# Patient Record
Sex: Male | Born: 1935
Health system: Southern US, Community
[De-identification: ages and names within clinical notes are randomized; demographics above are authoritative.]

## PROBLEM LIST (undated history)

## (undated) DIAGNOSIS — E785 Hyperlipidemia, unspecified: Secondary | ICD-10-CM

## (undated) DIAGNOSIS — L719 Rosacea, unspecified: Secondary | ICD-10-CM

## (undated) DIAGNOSIS — I10 Essential (primary) hypertension: Secondary | ICD-10-CM

## (undated) DIAGNOSIS — K219 Gastro-esophageal reflux disease without esophagitis: Secondary | ICD-10-CM

## (undated) DIAGNOSIS — I499 Cardiac arrhythmia, unspecified: Secondary | ICD-10-CM

## (undated) DIAGNOSIS — F419 Anxiety disorder, unspecified: Secondary | ICD-10-CM

## (undated) DIAGNOSIS — K279 Peptic ulcer, site unspecified, unspecified as acute or chronic, without hemorrhage or perforation: Secondary | ICD-10-CM

## (undated) DIAGNOSIS — N281 Cyst of kidney, acquired: Secondary | ICD-10-CM

## (undated) HISTORY — PX: KNEE ARTHROSCOPY: SUR90

## (undated) HISTORY — DX: Hyperlipidemia, unspecified: E78.5

## (undated) HISTORY — DX: Essential (primary) hypertension: I10

## (undated) HISTORY — DX: Cyst of kidney, acquired: N28.1

## (undated) HISTORY — DX: Anxiety disorder, unspecified: F41.9

## (undated) HISTORY — DX: Gastro-esophageal reflux disease without esophagitis: K21.9

## (undated) HISTORY — PX: CHOLECYSTECTOMY: SHX55

## (undated) HISTORY — DX: Cardiac arrhythmia, unspecified: I49.9

## (undated) HISTORY — DX: Peptic ulcer, site unspecified, unspecified as acute or chronic, without hemorrhage or perforation: K27.9

## (undated) HISTORY — DX: Rosacea, unspecified: L71.9

---

## 1972-10-28 HISTORY — PX: PARTIAL GASTRECTOMY: SHX2172

## 1988-10-28 DIAGNOSIS — N281 Cyst of kidney, acquired: Secondary | ICD-10-CM

## 1999-07-09 ENCOUNTER — Encounter: Payer: Self-pay | Admitting: Urology

## 1999-07-09 ENCOUNTER — Ambulatory Visit (HOSPITAL_COMMUNITY): Admission: RE | Admit: 1999-07-09 | Discharge: 1999-07-09 | Payer: Self-pay | Admitting: Urology

## 2000-04-04 ENCOUNTER — Ambulatory Visit (HOSPITAL_COMMUNITY): Admission: RE | Admit: 2000-04-04 | Discharge: 2000-04-04 | Payer: Self-pay | Admitting: Cardiology

## 2002-01-25 ENCOUNTER — Encounter: Payer: Self-pay | Admitting: Emergency Medicine

## 2002-01-25 ENCOUNTER — Emergency Department (HOSPITAL_COMMUNITY): Admission: EM | Admit: 2002-01-25 | Discharge: 2002-01-25 | Payer: Self-pay | Admitting: Emergency Medicine

## 2002-10-28 LAB — HM COLONOSCOPY

## 2004-09-13 ENCOUNTER — Ambulatory Visit: Payer: Self-pay | Admitting: Internal Medicine

## 2004-11-13 ENCOUNTER — Ambulatory Visit: Payer: Self-pay | Admitting: Internal Medicine

## 2005-02-19 ENCOUNTER — Ambulatory Visit: Payer: Self-pay | Admitting: Internal Medicine

## 2005-06-18 ENCOUNTER — Ambulatory Visit: Payer: Self-pay | Admitting: Internal Medicine

## 2005-09-11 ENCOUNTER — Ambulatory Visit: Payer: Self-pay | Admitting: Internal Medicine

## 2005-09-16 ENCOUNTER — Ambulatory Visit: Payer: Self-pay | Admitting: Internal Medicine

## 2005-12-17 ENCOUNTER — Ambulatory Visit: Payer: Self-pay | Admitting: Internal Medicine

## 2006-07-31 ENCOUNTER — Ambulatory Visit: Payer: Self-pay | Admitting: Internal Medicine

## 2006-12-02 ENCOUNTER — Ambulatory Visit: Payer: Self-pay | Admitting: Internal Medicine

## 2006-12-05 ENCOUNTER — Encounter: Admission: RE | Admit: 2006-12-05 | Discharge: 2006-12-05 | Payer: Self-pay | Admitting: Internal Medicine

## 2006-12-05 ENCOUNTER — Encounter: Payer: Self-pay | Admitting: Internal Medicine

## 2006-12-09 ENCOUNTER — Ambulatory Visit: Payer: Self-pay | Admitting: Internal Medicine

## 2006-12-09 LAB — CONVERTED CEMR LAB: Hgb A1c MFr Bld: 8 % — ABNORMAL HIGH (ref 4.6–6.0)

## 2007-05-27 DIAGNOSIS — I10 Essential (primary) hypertension: Secondary | ICD-10-CM | POA: Insufficient documentation

## 2007-05-27 DIAGNOSIS — L719 Rosacea, unspecified: Secondary | ICD-10-CM

## 2007-05-27 DIAGNOSIS — K279 Peptic ulcer, site unspecified, unspecified as acute or chronic, without hemorrhage or perforation: Secondary | ICD-10-CM | POA: Insufficient documentation

## 2007-05-27 DIAGNOSIS — F39 Unspecified mood [affective] disorder: Secondary | ICD-10-CM

## 2007-05-27 DIAGNOSIS — K219 Gastro-esophageal reflux disease without esophagitis: Secondary | ICD-10-CM

## 2007-06-09 DIAGNOSIS — E1142 Type 2 diabetes mellitus with diabetic polyneuropathy: Secondary | ICD-10-CM | POA: Insufficient documentation

## 2007-06-09 DIAGNOSIS — E1149 Type 2 diabetes mellitus with other diabetic neurological complication: Secondary | ICD-10-CM

## 2007-06-10 ENCOUNTER — Ambulatory Visit: Payer: Self-pay | Admitting: Internal Medicine

## 2007-06-11 LAB — CONVERTED CEMR LAB
Albumin: 3.7 g/dL (ref 3.5–5.2)
GFR calc Af Amer: 108 mL/min
GFR calc non Af Amer: 89 mL/min
Glucose, Bld: 168 mg/dL — ABNORMAL HIGH (ref 70–99)
Microalb Creat Ratio: 4.1 mg/g (ref 0.0–30.0)
Phosphorus: 2.1 mg/dL — ABNORMAL LOW (ref 2.3–4.6)
Potassium: 4.2 meq/L (ref 3.5–5.1)
Sodium: 139 meq/L (ref 135–145)

## 2007-07-03 ENCOUNTER — Encounter (INDEPENDENT_AMBULATORY_CARE_PROVIDER_SITE_OTHER): Payer: Self-pay | Admitting: *Deleted

## 2007-08-14 ENCOUNTER — Ambulatory Visit: Payer: Self-pay | Admitting: Internal Medicine

## 2007-12-08 ENCOUNTER — Ambulatory Visit: Payer: Self-pay | Admitting: Internal Medicine

## 2007-12-09 LAB — CONVERTED CEMR LAB
ALT: 31 units/L (ref 0–53)
Albumin: 3.9 g/dL (ref 3.5–5.2)
BUN: 21 mg/dL (ref 6–23)
Basophils Relative: 0.1 % (ref 0.0–1.0)
Calcium: 9.1 mg/dL (ref 8.4–10.5)
Creatinine, Ser: 1 mg/dL (ref 0.4–1.5)
GFR calc non Af Amer: 79 mL/min
HDL: 32.1 mg/dL — ABNORMAL LOW (ref >39.0)
Hgb A1c MFr Bld: 7.6 % — ABNORMAL HIGH (ref 4.6–6.0)
LDL Cholesterol: 110 mg/dL — ABNORMAL HIGH (ref 0–99)
Lymphocytes Relative: 40.2 % (ref 12.0–46.0)
Monocytes Relative: 8 % (ref 3.0–11.0)
Neutro Abs: 3.1 10*3/uL (ref 1.4–7.7)
Phosphorus: 2.7 mg/dL (ref 2.3–4.6)
Platelets: 219 10*3/uL (ref 150–400)
Potassium: 4.7 meq/L (ref 3.5–5.1)
RBC: 4.7 M/uL (ref 4.22–5.81)
TSH: 1.95 microintl units/mL (ref 0.35–5.50)
Total CHOL/HDL Ratio: 5.5
Triglycerides: 173 mg/dL — ABNORMAL HIGH (ref 0–149)
VLDL: 35 mg/dL (ref 0–40)
WBC: 6.2 10*3/uL (ref 4.5–10.5)

## 2008-06-17 ENCOUNTER — Ambulatory Visit: Payer: Self-pay | Admitting: Internal Medicine

## 2008-06-20 LAB — CONVERTED CEMR LAB
Calcium: 9.6 mg/dL (ref 8.4–10.5)
Eosinophils Absolute: 0.1 10*3/uL (ref 0.0–0.7)
GFR calc Af Amer: 85 mL/min
GFR calc non Af Amer: 71 mL/min
HCT: 48.1 % (ref 39.0–52.0)
Hgb A1c MFr Bld: 7.6 % — ABNORMAL HIGH (ref 4.6–6.0)
MCV: 95.7 fL (ref 78.0–100.0)
Monocytes Absolute: 0.4 10*3/uL (ref 0.1–1.0)
Phosphorus: 3.1 mg/dL (ref 2.3–4.6)
Platelets: 221 10*3/uL (ref 150–400)
Potassium: 4.7 meq/L (ref 3.5–5.1)
RDW: 11.6 % (ref 11.5–14.6)
Sodium: 141 meq/L (ref 135–145)

## 2008-07-27 ENCOUNTER — Ambulatory Visit: Payer: Self-pay | Admitting: Internal Medicine

## 2008-09-10 IMAGING — US US RENAL
1 series · 14 of 25 positions shown · non-contrast
Comparison: none

CLINICAL DATA: Evaluate left renal cyst.
 RENAL/URINARY TRACT ULTRASOUND:
TECHNIQUE: Complete ultrasound of the urinary tract was performed including evaluation of the kidney, renal collecting systems, and urinary bladder.
 No hydronephrosis is seen. The right kidney measures 12.4 cm sagittally with the left kidney measuring 12.7 cm.  The echogenicity of the renal parenchyma is within normal limits.  There is a large left upper pole renal cyst medially measuring 8.9 x 5.7 x 9.2 cm.  No septations are noted.  The urinary bladder is not well distended.  Prostate is within upper limits of normal.

[Series 1: unknown · 0.26mm/px · 14 of 41 slices shown]
[im 1/41]
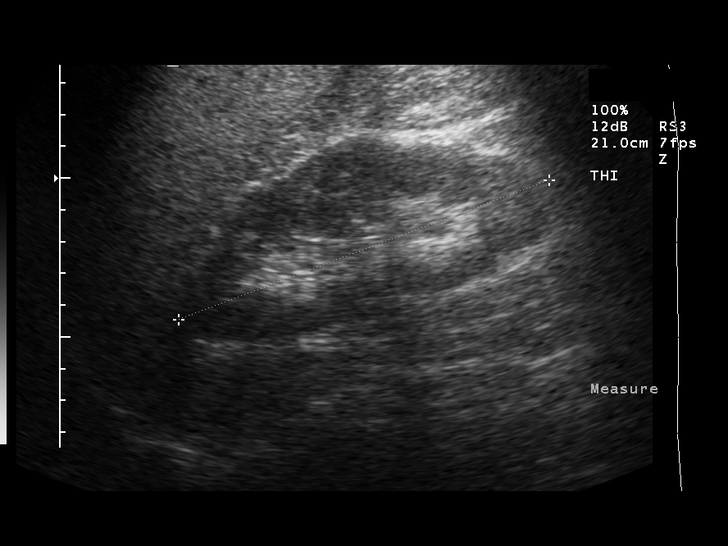
[im 4/41]
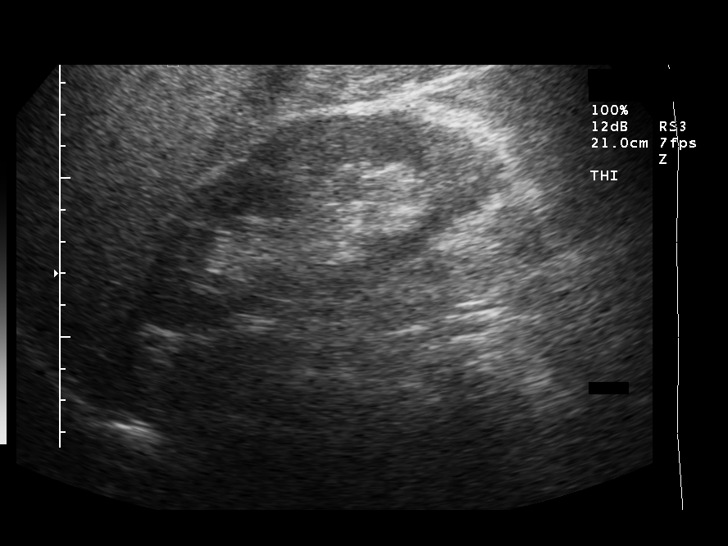
[im 7/41]
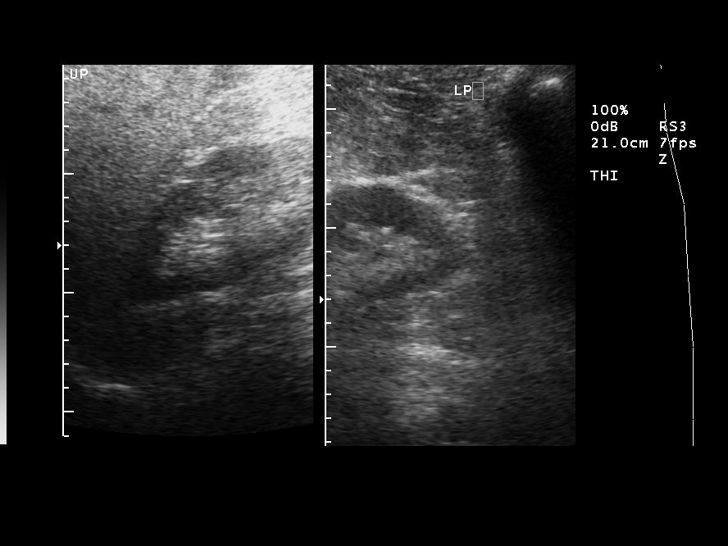
[im 11/41]
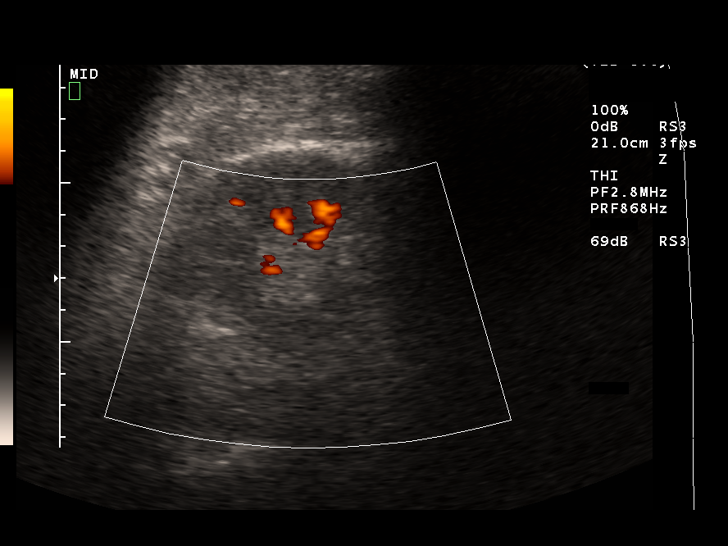
[im 14/41]
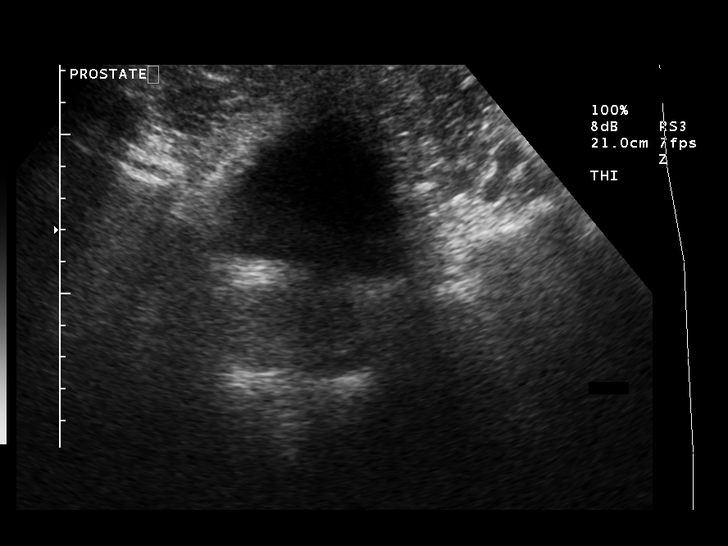
[im 16/41]
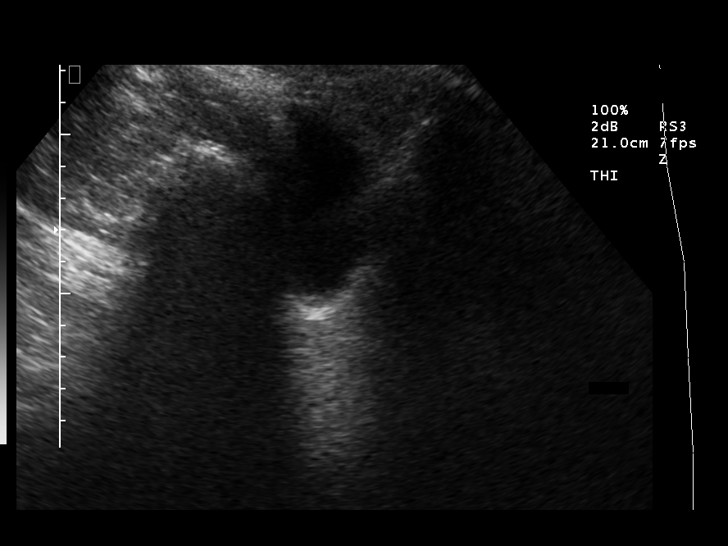
[im 19/41]
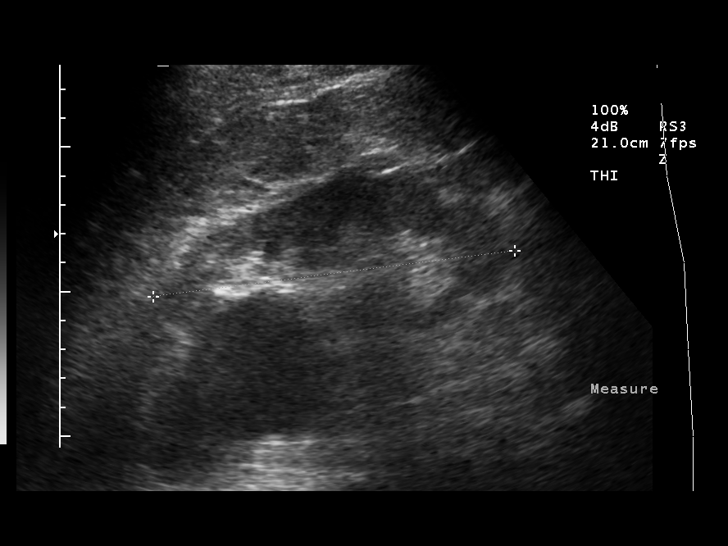
[im 22/41]
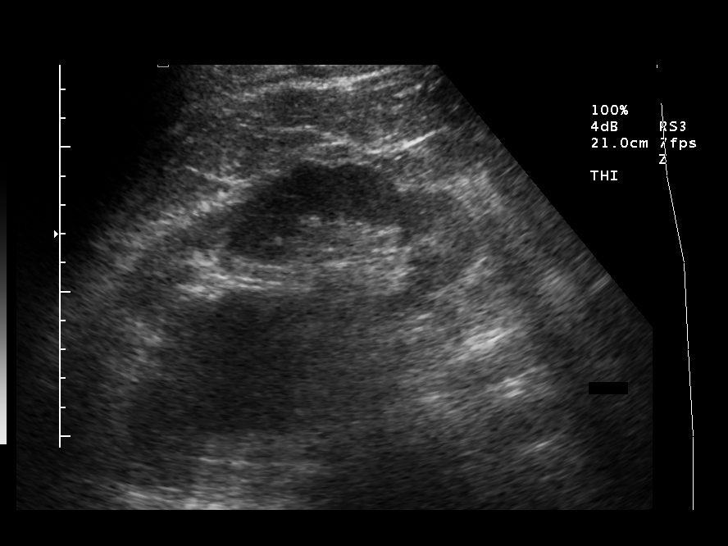
[im 26/41]
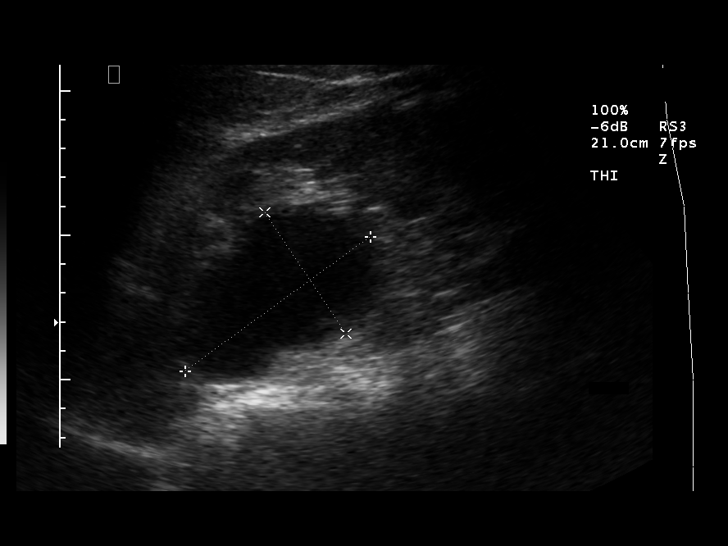
[im 27/41]
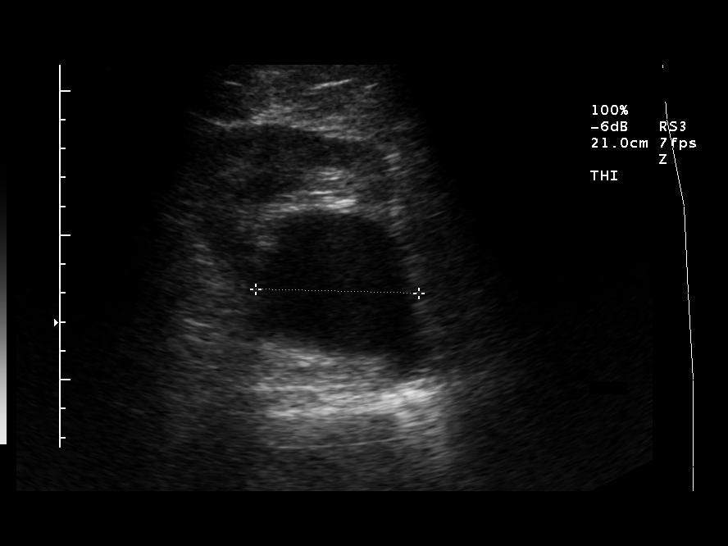
[im 31/41]
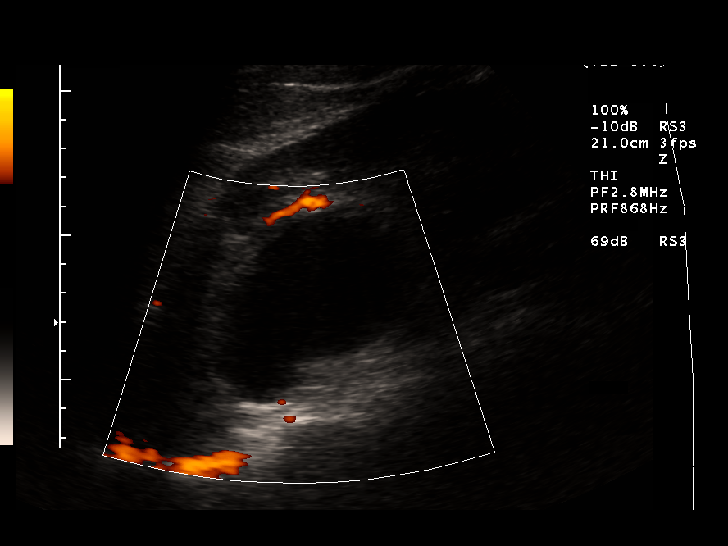
[im 34/41]
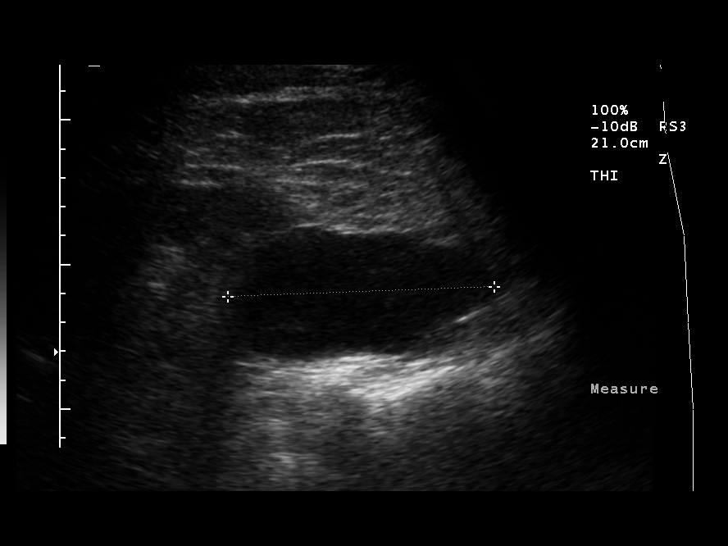
[im 37/41]
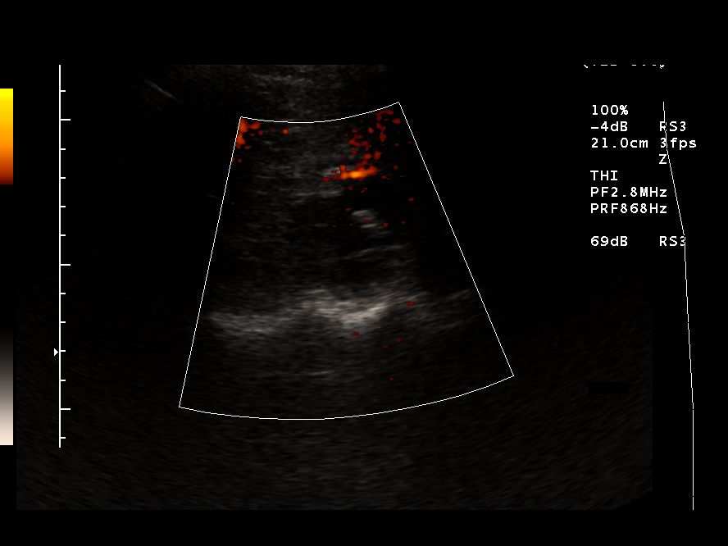
[im 41/41]
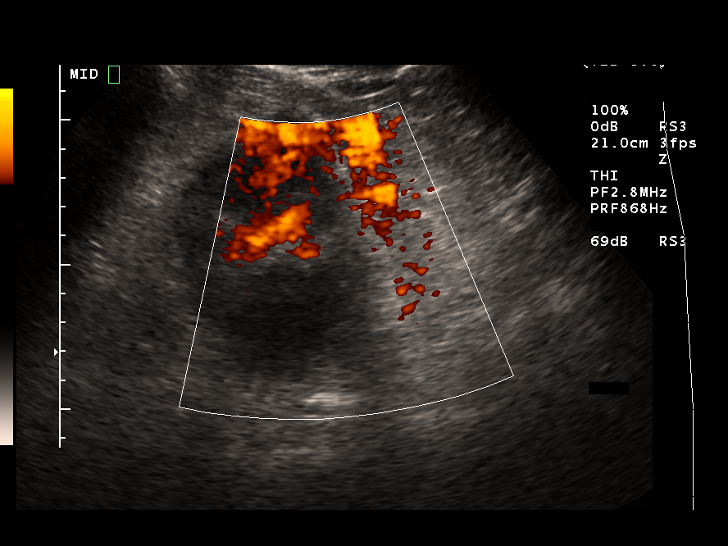

[14 of 25 positions shown; findings below may reference images not displayed]

IMPRESSION: No hydronephrosis.  8.9 cm medial left upper pole renal cyst.  No complicating features.

## 2008-12-20 ENCOUNTER — Ambulatory Visit: Payer: Self-pay | Admitting: Internal Medicine

## 2008-12-20 DIAGNOSIS — E785 Hyperlipidemia, unspecified: Secondary | ICD-10-CM

## 2008-12-20 DIAGNOSIS — R079 Chest pain, unspecified: Secondary | ICD-10-CM

## 2008-12-20 DIAGNOSIS — E1169 Type 2 diabetes mellitus with other specified complication: Secondary | ICD-10-CM | POA: Insufficient documentation

## 2009-06-13 ENCOUNTER — Ambulatory Visit: Payer: Self-pay | Admitting: Internal Medicine

## 2009-06-15 LAB — CONVERTED CEMR LAB
Basophils Relative: 0.1 % (ref 0.0–3.0)
Bilirubin, Direct: 0.1 mg/dL (ref 0.0–0.3)
CO2: 29 meq/L (ref 19–32)
Chloride: 106 meq/L (ref 96–112)
Eosinophils Relative: 3.7 % (ref 0.0–5.0)
HCT: 45.4 % (ref 39.0–52.0)
HDL: 35.4 mg/dL — ABNORMAL LOW (ref >39.00)
Hemoglobin: 15.5 g/dL (ref 13.0–17.0)
Hgb A1c MFr Bld: 7.7 % — ABNORMAL HIGH (ref 4.6–6.5)
LDL Cholesterol: 102 mg/dL — ABNORMAL HIGH (ref 0–99)
Lymphs Abs: 2.7 10*3/uL (ref 0.7–4.0)
MCV: 95.4 fL (ref 78.0–100.0)
Monocytes Absolute: 0.5 10*3/uL (ref 0.1–1.0)
Potassium: 4.9 meq/L (ref 3.5–5.1)
RBC: 4.76 M/uL (ref 4.22–5.81)
Sodium: 142 meq/L (ref 135–145)
Total Bilirubin: 0.7 mg/dL (ref 0.3–1.2)
Total Protein: 7.2 g/dL (ref 6.0–8.3)
VLDL: 27 mg/dL (ref 0.0–40.0)
WBC: 6.1 10*3/uL (ref 4.5–10.5)

## 2009-06-20 ENCOUNTER — Ambulatory Visit (HOSPITAL_COMMUNITY): Admission: RE | Admit: 2009-06-20 | Discharge: 2009-06-20 | Payer: Self-pay | Admitting: Internal Medicine

## 2009-08-03 ENCOUNTER — Ambulatory Visit: Payer: Self-pay | Admitting: Internal Medicine

## 2009-12-19 ENCOUNTER — Ambulatory Visit: Payer: Self-pay | Admitting: Internal Medicine

## 2009-12-20 LAB — CONVERTED CEMR LAB
Albumin: 4 g/dL (ref 3.5–5.2)
Chloride: 105 meq/L (ref 96–112)
Phosphorus: 3.1 mg/dL (ref 2.3–4.6)
Potassium: 4.9 meq/L (ref 3.5–5.1)
Sodium: 139 meq/L (ref 135–145)

## 2010-05-17 ENCOUNTER — Encounter: Payer: Self-pay | Admitting: Internal Medicine

## 2010-05-21 ENCOUNTER — Ambulatory Visit: Payer: Self-pay | Admitting: Internal Medicine

## 2010-05-22 LAB — CONVERTED CEMR LAB: Hgb A1c MFr Bld: 7.3 % — ABNORMAL HIGH (ref 4.6–6.5)

## 2010-07-26 ENCOUNTER — Ambulatory Visit: Payer: Self-pay | Admitting: Internal Medicine

## 2010-07-30 ENCOUNTER — Encounter: Payer: Self-pay | Admitting: Family Medicine

## 2010-07-30 ENCOUNTER — Ambulatory Visit: Payer: Self-pay | Admitting: Internal Medicine

## 2010-07-30 DIAGNOSIS — R42 Dizziness and giddiness: Secondary | ICD-10-CM | POA: Insufficient documentation

## 2010-07-30 LAB — CONVERTED CEMR LAB
ALT: 21 units/L (ref 0–53)
AST: 24 units/L (ref 0–37)
Albumin: 4.2 g/dL (ref 3.5–5.2)
Alkaline Phosphatase: 57 units/L (ref 39–117)
BUN: 26 mg/dL — ABNORMAL HIGH (ref 6–23)
Basophils Relative: 0.2 % (ref 0.0–3.0)
CO2: 26 meq/L (ref 19–32)
Chloride: 102 meq/L (ref 96–112)
Eosinophils Relative: 2.2 % (ref 0.0–5.0)
Glucose, Bld: 230 mg/dL — ABNORMAL HIGH (ref 70–99)
Lymphocytes Relative: 26.8 % (ref 12.0–46.0)
MCV: 95.5 fL (ref 78.0–100.0)
Monocytes Absolute: 0.7 10*3/uL (ref 0.1–1.0)
Monocytes Relative: 7.9 % (ref 3.0–12.0)
Neutrophils Relative %: 62.9 % (ref 43.0–77.0)
Platelets: 220 10*3/uL (ref 150.0–400.0)
Potassium: 4.9 meq/L (ref 3.5–5.1)
RBC: 4.75 M/uL (ref 4.22–5.81)
Sodium: 138 meq/L (ref 135–145)
TSH: 1.85 microintl units/mL (ref 0.35–5.50)
Total Protein: 7.3 g/dL (ref 6.0–8.3)
WBC: 8.2 10*3/uL (ref 4.5–10.5)

## 2010-08-01 ENCOUNTER — Ambulatory Visit: Payer: Self-pay | Admitting: Internal Medicine

## 2010-11-16 ENCOUNTER — Encounter: Payer: Self-pay | Admitting: Internal Medicine

## 2010-11-17 LAB — CONVERTED CEMR LAB
Hgb A1c MFr Bld: 6.9 %
LDL Cholesterol: 109 mg/dL

## 2010-11-21 ENCOUNTER — Ambulatory Visit
Admission: RE | Admit: 2010-11-21 | Discharge: 2010-11-21 | Payer: Self-pay | Source: Home / Self Care | Attending: Internal Medicine | Admitting: Internal Medicine

## 2010-11-21 LAB — CONVERTED CEMR LAB: Hgb A1c MFr Bld: 6.9 %

## 2010-11-25 LAB — CONVERTED CEMR LAB
AST: 27 units/L (ref 0–37)
Albumin: 3.9 g/dL (ref 3.5–5.2)
BUN: 22 mg/dL (ref 6–23)
Basophils Absolute: 0 10*3/uL (ref 0.0–0.1)
Basophils Relative: 0.1 % (ref 0.0–3.0)
CO2: 24 meq/L (ref 19–32)
Calcium: 8.9 mg/dL (ref 8.4–10.5)
Calcium: 9.2 mg/dL (ref 8.4–10.5)
Chloride: 104 meq/L (ref 96–112)
Creatinine,U: 202.4 mg/dL
GFR calc Af Amer: 107 mL/min
GFR calc Af Amer: 96 mL/min
GFR calc non Af Amer: 89 mL/min
Glucose, Bld: 255 mg/dL — ABNORMAL HIGH (ref 70–99)
Hemoglobin: 15.7 g/dL (ref 13.0–17.0)
Hgb A1c MFr Bld: 7.9 % — ABNORMAL HIGH (ref 4.6–6.0)
Lymphocytes Relative: 36.5 % (ref 12.0–46.0)
MCHC: 35.1 g/dL (ref 30.0–36.0)
MCV: 93.4 fL (ref 78.0–100.0)
Neutro Abs: 3.3 10*3/uL (ref 1.4–7.7)
PSA: 2.14 ng/mL (ref 0.10–4.00)
Phosphorus: 2.2 mg/dL — ABNORMAL LOW (ref 2.3–4.6)
Potassium: 4.2 meq/L (ref 3.5–5.1)
RBC: 4.79 M/uL (ref 4.22–5.81)
Sodium: 136 meq/L (ref 135–145)
VLDL: 61 mg/dL — ABNORMAL HIGH (ref 0–40)

## 2010-11-27 NOTE — Assessment & Plan Note (Signed)
Summary: F/U FRM DR. Reece Agar TO TALK ABOUT SEEING A HEART DOC / LFW   Vital Signs:  Patient profile:   75 year old male Weight:      195 pounds Temp:     98.0 degrees F oral Pulse rate:   76 / minute Pulse rhythm:   regular BP sitting:   142 / 80  (left arm) Cuff size:   large  Vitals Entered By: Mervin Hack CMA Duncan Dull) (August 01, 2010 11:01 AM) CC: follow-up visit cardiology   History of Present Illness: Reviewed his episode from 2 days ago Felt flushed in face, felt warm and flushed and dizzy while driving No syncope No chest pian No SOB Occ gets sore across his chest when raking leaves---he relates this to hiatal hernia No pain with aggressive walking, etc  Has had some stress Brother is sick--had to go to [place] to live with his son Sister broke hip and is in rehab 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  Past Medical History: Reviewed history from 06/13/2009 and no changes required. Anxiety Diabetes mellitus, type II with neuropathy GERD Hypertension Peptic ulcer disease Rosacea Hyperlipidemia Left renal cyst  Past Surgical History: Reviewed history from 05/27/2007 and no changes required. Cholecystectomy 1993 Partial gastrectomy- ulcer 1974 CP-Stress test okay 1980's  Family History: Reviewed history from 05/27/2007 and no changes required. Father--didn't know, died of MI? Mother: Died at age 6, leukemia Siblings: 4 Brothers, one deceased at age 67 with MI, 3 living.  4 Sisters, one deceased  at age 75 with breast cancer, 3 living CAD-- Dad (?), brother HTN and DM in sibs Prostate cancer, one brother Breast/Ovarian/Uterine IT consultant  Social History: Reviewed history from 05/27/2007 and no changes required. 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   Head:  no temporal bruits Neck:  supple, no masses, no thyromegaly, no carotid bruits, and no cervical lymphadenopathy.   Lungs:  normal respiratory effort, no intercostal retractions, no accessory muscle use, and normal breath sounds.   Heart:  normal rate, regular rhythm, no murmur, no gallop, and no rub.   Extremities:  no edema Psych:  normally interactive, good eye contact, not anxious appearing, and not depressed appearing.     Impression & Recommendations:  Problem # 1:  DIZZINESS (ICD-780.4) Assessment Improved no recurrence has ongoing flushing that just restarted that sounds hormonal  No sig reason to consider pheochromocytoma at this time---if spells persist and worsen, would consider checking urine tests  Blocked PAC on EKG--no reason to believe he has problem with CCB at this point  No changes  Complete Medication List: 1)  Alprazolam 1 Mg Tb24 (Alprazolam) .... Take 1/2 - 1  by mouth two times a day as needed for nerves 2)  Metformin Hcl 1000 Mg Tabs (Metformin hcl) .Marland KitchenMarland KitchenMarland Kitchen  Take one by mouth before breakfast and dinner and 1/2 at bedtime 3)  Glimepiride 4 Mg Tabs (Glimepiride) .... Take one by mouth two times a day 4)  Verapamil Hcl Cr 240 Mg Cp24 (Verapamil hcl) .... Take one by mouth once a day 5)  Lisinopril 40 Mg Tabs (Lisinopril) .Marland Kitchen.. 1 daily 6)  Aspirin 81 Mg Tbec (Aspirin) .... Take one by mouth once a day 7)  Fish Oil 1000 Mg Caps (Omega-3 fatty acids) .... 2  pills a day  Patient Instructions: 1)  Please call if you continue to have problems with the flushing 2)  Please keep the January 25th appt  Current Allergies (reviewed today): No known allergies   Appended Document: Orders Update    Clinical Lists Changes  Orders: Added new Service order of Est. Patient Level III (24401) - Signed

## 2010-11-27 NOTE — Assessment & Plan Note (Signed)
Summary: 6 month follow up   Vital Signs:  Patient profile:   75 year old male Weight:      202 pounds Temp:     97.9 degrees F oral Pulse rate:   76 / minute Pulse rhythm:   regular BP sitting:   122 / 70  (left arm) Cuff size:   large  Vitals Entered By: Mervin Hack CMA Duncan Dull) (December 19, 2009 9:03 AM) CC: 6 month follow-up   History of Present Illness: Sugars have been going up some Seems that his sugars are better if he eats more--but then he gains weight Generally 114-140 in AM Some up to 150's and 160's Random occ over 200 has tried extra 1/2 metformin and extra glimiride at night occ [facility] eye exam--no retinopathy Mild feet burning--not enough to consider meds  Tries to stay active  Walks and yard work--volunteers at [facility] (errands) careful with diet  No chest pain No SOB No change in exercise tolerance  Mood generally okay Brief nervous feelings---occ uses med  Allergies: No Known Drug Allergies  Past History:  Past medical, surgical, family and social histories (including risk factors) reviewed for relevance to current acute and chronic problems.  Past Medical History: Reviewed history from 06/13/2009 and no changes required. Anxiety Diabetes mellitus, type II with neuropathy GERD Hypertension Peptic ulcer disease Rosacea Hyperlipidemia Left renal cyst  Past Surgical History: Reviewed history from 05/27/2007 and no changes required. Cholecystectomy 1993 Partial gastrectomy- ulcer 1974 CP-Stress test okay 1980's  Family History: Reviewed history from 05/27/2007 and no changes required. Father--didn't know, died of MI? Mother: Died at age 55, leukemia Siblings: 4 Brothers, one deceased at age 93 with MI, 3 living.  4 Sisters, one deceased at age 55 with breast cancer, 3 living CAD-- Dad (?), brother HTN and DM in sibs Prostate cancer, one brother Breast/Ovarian/Uterine IT consultant  Social History: Reviewed history from  05/27/2007 and no changes required. 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   Neck:  supple, no masses, no thyromegaly, no carotid bruits, and no cervical lymphadenopathy.   Lungs:  normal respiratory effort and normal breath sounds.   Heart:  normal rate, regular rhythm, no murmur, and no gallop.   Abdomen:  soft and non-tender.   Pulses:  1+ in feet Extremities:  trace edema in left foot none on right Neurologic:  alert & oriented X3, strength normal in all extremities, and gait normal.   Psych:  normally interactive, good eye contact, not anxious appearing, and not depressed appearing.    Diabetes Management Exam:    Foot Exam (with socks and/or shoes not present):       Sensory-Pinprick/Light touch:          Left medial foot (L-4): normal          Left dorsal foot (L-5): normal          Left lateral foot (S-1): normal          Right medial foot (L-4): normal          Right dorsal foot (L-5): normal          Right lateral foot (S-1): normal       Inspection:  Left foot: normal          Right foot: normal       Nails:          Left foot: normal          Right foot: normal    Eye Exam:       Eye Exam done elsewhere          Date: 10/11/2009          Results: NO retinopathy          Done by: [facility]   Impression & Recommendations:  Problem # 1:  DIABETES MELLITUS, TYPE II, WITH NEUROLOGICAL COMPLICATIONS (ICD-250.60) Assessment Comment Only  fingersticks slightly higher will add another 1/2 metformin check labs mild burning in feet  His updated medication list for this problem includes:    Metformin Hcl 1000 Mg Tabs (Metformin hcl) .Marland Kitchen... Take one by mouth before  breakfast and 1.5 tabs before supper    Glimepiride 4 Mg Tabs (Glimepiride) .Marland Kitchen... Take one by mouth two times a day    Aspirin 81 Mg Tbec (Aspirin) .Marland Kitchen... Take one by mouth once a day    Lisinopril 40 Mg Tabs (Lisinopril) .Marland Kitchen... 1 daily  Orders: TLB-A1C / Hgb A1C (Glycohemoglobin) (83036-A1C)  Problem # 2:  HYPERTENSION (ICD-401.9) Assessment: Unchanged  good control no changes needed  His updated medication list for this problem includes:    Verapamil Hcl Cr 240 Mg Cp24 (Verapamil hcl) .Marland Kitchen... Take one by mouth once a day    Lisinopril 40 Mg Tabs (Lisinopril) .Marland Kitchen... 1 daily  BP today: 122/70 Prior BP: 128/80 (06/13/2009)  Labs Reviewed: K+: 4.9 (06/13/2009) Creat: : 1.0 (06/13/2009)   Chol: 164 (06/13/2009)   HDL: 35.40 (06/13/2009)   LDL: 102 (06/13/2009)   TG: 135.0 (06/13/2009)  Orders: Venipuncture (78295) TLB-Renal Function Panel (80069-RENAL)  Problem # 3:  HYPERLIPIDEMIA (ICD-272.4) Assessment: Comment Only doing okay with just the fish oil  Labs Reviewed: SGOT: 21 (06/13/2009)   SGPT: 24 (06/13/2009)   HDL:35.40 (06/13/2009), 30.1 (12/20/2008)  LDL:102 (06/13/2009), DEL (12/20/2008)  Chol:164 (06/13/2009), 182 (12/20/2008)  Trig:135.0 (06/13/2009), 305 (12/20/2008)  Problem # 4:  ANXIETY (ICD-300.00) Assessment: Unchanged mild and intermittent okay with occ meds  His updated medication list for this problem includes:    Alprazolam 1 Mg Tb24 (Alprazolam) .Marland Kitchen... Take 1/2 - 1  by mouth two times a day as needed for nerves  Complete Medication List: 1)  Metformin Hcl 1000 Mg Tabs (Metformin hcl) .... Take one by mouth before breakfast and 1.5 tabs before supper 2)  Alprazolam 1 Mg Tb24 (Alprazolam) .... Take 1/2 - 1  by mouth two times a day as needed for nerves 3)  Glimepiride 4 Mg Tabs (Glimepiride) .... Take one by mouth two times a day 4)  Verapamil Hcl Cr 240 Mg Cp24 (Verapamil hcl) .... Take one by mouth once a day 5)  Aspirin 81 Mg Tbec (Aspirin) .... Take one  by mouth once a day 6)  Lisinopril 40 Mg Tabs (Lisinopril) .Marland Kitchen.. 1 daily  Patient Instructions: 1)  Please schedule a follow-up appointment in 6 months .  Prescriptions: METFORMIN HCL 1000 MG  TABS (METFORMIN HCL) Take one by mouth before breakfast and 1.5 tabs before supper  #75 x 11   Entered and Authorized by:   Cindee Salt MD   Signed by:   Cindee Salt MD on 12/19/2009   Method used:   Electronically to  [facility] [address] [phone]* (retail)       [address] [place], [place]  [id number]       Ph: [phone]       Fax: [phone]   RxID:   [id number]   Current Allergies (reviewed today): No known allergies

## 2010-11-27 NOTE — Assessment & Plan Note (Signed)
Summary: ROA FOR CHECK-UP   Vital Signs:  Patient profile:   75 year old male Weight:      199.50 pounds BMI:     31.36 Temp:     98.0 degrees F oral Pulse rate:   84 / minute Pulse rhythm:   regular BP sitting:   130 / 84  (left arm) Cuff size:   large  Vitals Entered By: Sydell Axon LPN (May 21, 2010 10:48 AM) CC: 6 Month follow-up   History of Present Illness: DOing fairly well Had blood work done through [facility] he [facility]  Chol 174 with LDL of 112  Diabetes control has been better checks sugars regularly Sugars 99-140 and mostly under 120 Lower since adding additional 1/2 metformin Trying to be careful with diet Hard to exercise in the heat due for eye exam coming up Feet still burn---tolerable No sores on feet  No chest pain No SOB no edema  Not excited about taking statin  Allergies: No Known Drug Allergies  Past History:  Past medical, surgical, family and social histories (including risk factors) reviewed for relevance to current acute and chronic problems.  Past Medical History: Reviewed history from 06/13/2009 and no changes required. Anxiety Diabetes mellitus, type II with neuropathy GERD Hypertension Peptic ulcer disease Rosacea Hyperlipidemia Left renal cyst  Past Surgical History: Reviewed history from 05/27/2007 and no changes required. Cholecystectomy 1993 Partial gastrectomy- ulcer 1974 CP-Stress test okay 1980's  Family History: Reviewed history from 05/27/2007 and no changes required. Father--didn't know, died of MI? Mother: Died at age 29, leukemia Siblings: 4 Brothers, one deceased at age 73 with MI, 3 living.  4 Sisters, one deceased at age 37 with breast cancer, 3 living CAD-- Dad (?), brother HTN and DM in sibs Prostate cancer, one brother Breast/Ovarian/Uterine IT consultant  Social History: Reviewed history from 05/27/2007 and no changes required. 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   Neck:  supple, no masses, no thyromegaly, no carotid bruits, and no cervical lymphadenopathy.   Lungs:  normal respiratory effort, no intercostal retractions, no accessory muscle use, and normal breath sounds.   Heart:  normal rate, regular rhythm, no murmur, and no gallop.   Msk:  no joint tenderness and no joint swelling.   Pulses:  1+ in each foot Extremities:  no edema Skin:  no suspicious lesions and no ulcerations.   Psych:  normally interactive, good eye contact, not anxious appearing, and not depressed appearing.    Diabetes Management Exam:    Foot Exam (with socks and/or shoes not present):       Sensory-Pinprick/Light touch:          Left medial foot (L-4): diminished          Left dorsal foot (L-5): diminished          Left lateral foot (S-1): diminished          Right medial foot (L-4): diminished          Right dorsal foot (L-5): diminished          Right lateral foot (S-1): diminished       Inspection:          Left foot: abnormal             Comments: slight callous at base of 1st MTP          Right foot:  abnormal             Comments: slight callous at base of 1st MTP       Nails:          Left foot: thickened          Right foot: thickened   Impression & Recommendations:  Problem # 1:  DIABETES MELLITUS, TYPE II, WITH NEUROLOGICAL COMPLICATIONS (ICD-250.60) Assessment Improved  seems to be better will recheck A1c  His updated medication list for this problem includes:    Metformin Hcl 1000 Mg Tabs (Metformin hcl) .Marland Kitchen... Take one by mouth before breakfast and dinner and 1/2 at bedtime    Glimepiride 4 Mg Tabs (Glimepiride) .Marland Kitchen... Take one by mouth two times a day    Aspirin 81 Mg Tbec (Aspirin) .Marland Kitchen... Take one by mouth once a day    Lisinopril 40 Mg Tabs (Lisinopril) .Marland Kitchen... 1 daily  Orders: Venipuncture  (07371) TLB-A1C / Hgb A1C (Glycohemoglobin) (83036-A1C)  Problem # 2:  HYPERLIPIDEMIA (ICD-272.4) Assessment: Comment Only LDL 112 discussed meds----he prefers not and no clear evidence it will reduce mortality  Problem # 3:  HYPERTENSION (ICD-401.9) Assessment: Unchanged good control no changes needed  His updated medication list for this problem includes:    Verapamil Hcl Cr 240 Mg Cp24 (Verapamil hcl) .Marland Kitchen... Take one by mouth once a day    Lisinopril 40 Mg Tabs (Lisinopril) .Marland Kitchen... 1 daily  BP today: 130/84 Prior BP: 122/70 (12/19/2009)  Labs Reviewed: K+: 4.9 (12/19/2009) Creat: : 1.1 (12/19/2009)   Chol: 164 (06/13/2009)   HDL: 35.40 (06/13/2009)   LDL: 102 (06/13/2009)   TG: 135.0 (06/13/2009)  Problem # 4:  ANXIETY (ICD-300.00) Assessment: Unchanged uses occ   His updated medication list for this problem includes:    Alprazolam 1 Mg Tb24 (Alprazolam) .Marland Kitchen... Take 1/2 - 1  by mouth two times a day as needed for nerves  Complete Medication List: 1)  Metformin Hcl 1000 Mg Tabs (Metformin hcl) .... Take one by mouth before breakfast and dinner and 1/2 at bedtime 2)  Alprazolam 1 Mg Tb24 (Alprazolam) .... Take 1/2 - 1  by mouth two times a day as needed for nerves 3)  Glimepiride 4 Mg Tabs (Glimepiride) .... Take one by mouth two times a day 4)  Verapamil Hcl Cr 240 Mg Cp24 (Verapamil hcl) .... Take one by mouth once a day 5)  Aspirin 81 Mg Tbec (Aspirin) .... Take one by mouth once a day 6)  Lisinopril 40 Mg Tabs (Lisinopril) .Marland Kitchen.. 1 daily  Patient Instructions: 1)  Please schedule a follow-up appointment in 6 months .  2)  Not due for colonoscopy till  ~2014----not sure why he should have an appt with Dr Russella Dar  Current Allergies (reviewed today): No known allergies

## 2010-11-27 NOTE — Assessment & Plan Note (Signed)
Summary: FLU SHOT/CLE  Nurse Visit   Allergies: No Known Drug Allergies  Immunizations Administered:  Influenza Vaccine # 1:    Vaccine Type: Fluvax MCR    Site: left deltoid    Mfr: [employer]oSmith[employer]    Dose: 0.5 ml    Route: IM    Given by: DeShannon Smith CMA (AAMA)    Exp. Date: 04/27/2011    Lot #: [id number]    VIS given: 05/22/10 version given July 26, 2010.  Flu Vaccine Consent Questions:    Do you have a history of severe allergic reactions to this vaccine? no    Any prior history of allergic reactions to egg and/or gelatin? no    Do you have a sensitivity to the preservative Thimersol? no    Do you have a past history of Guillan-Barre Syndrome? no    Do you currently have an acute febrile illness? no    Have you ever had a severe reaction to latex? no    Vaccine information given and explained to patient? yes  Orders Added: 1)  Influenza Vaccine MCR [00025] 

## 2010-11-27 NOTE — Assessment & Plan Note (Signed)
Summary: feeling dizzy, almost passed out/alc   Vital Signs:  Patient profile:   75 year old [name] Weight:      203.25 pounds Temp:     97.7 degrees F oral Pulse rate:   104 / minute Pulse (ortho):   106 / minute Pulse rhythm:   y BP sitting:   170 / 90  (left arm) BP standing:   150 / 90 Cuff size:   large  Vitals Entered By: Selena Batten Dance CMA (AAMA) (July 30, 2010 10:22 AM)  Serial Vital Signs/Assessments:  Time      Position  BP       Pulse  Resp  Temp     By 11:01 AM  Lying LA  146/88   100                   Kim Dance CMA (AAMA) 11:01 AM  Sitting   150/88   104                   Kim Dance CMA (AAMA) 11:01 AM  Standing  150/90   106                   Kim Dance CMA (AAMA)  CC: "Feels funny"   History of Present Illness: CC: feeling strange, almost passed out  75yo with h/o T2DM, HTN, HLD, GERD, anxiety presents with "almost passed out and feels funny"  Pt is a transporter.  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  No CP/SOB, no other pains, abd pain, n/v.  Never had any similar event in past.  No h/o heart issues.  No recent viral illness.  No weakness, no slurred speech, no AMS.  Still a bit woozy.  Now with slight headache.  No LOC.  10 years ago fell off building with skull fracture, h/o panic attacks, h/o vertigo.  BP normally well controlled on current antihypertensives.  Today 170/90, pt attributes to panicking.  This morning checked sugars, 119 fcbg.  wonders if low caused this.  No smoking, alcohol, rec drugs.  Current Medications (verified): 1)  Metformin Hcl 1000 Mg  Tabs (Metformin Hcl) .... Take One By Mouth Before Breakfast and Dinner and 1/2 At Bedtime 2)  Alprazolam 1 Mg  Tb24 (Alprazolam) .... Take 1/2 - 1  By Mouth Two Times A Day As Needed For Nerves 3)  Glimepiride 4 Mg  Tabs (Glimepiride) .... Take One By Mouth Two Times A Day 4)  Verapamil Hcl Cr 240 Mg  Cp24  (Verapamil Hcl) .... Take One By Mouth Once A Day 5)  Aspirin 81 Mg  Tbec (Aspirin) .... Take One By Mouth Once A Day 6)  Lisinopril 40 Mg Tabs (Lisinopril) .Marland Kitchen.. 1 Daily  Allergies (verified): No Known Drug Allergies  Past History:  Past Medical History: Last updated: 06/13/2009 Anxiety Diabetes mellitus, type II with neuropathy GERD Hypertension Peptic ulcer disease Rosacea Hyperlipidemia Left renal cyst  Past Surgical History: Last updated: 05/27/2007 Cholecystectomy 1993 Partial gastrectomy- ulcer 1974 CP-Stress test okay 1980's  Social History: Last updated: 05/27/2007 :Divorced 1992 Retired--electrical/HVAC for [employer] Children: 2 Occupation: Retired, Electrical/HVAC Never Smoked Alcohol use-no PMH-FH-SH reviewed for relevance  Review of Systems       per HPI  Physical Exam  General:  alert and normal appearance.   Head:  Normocephalic and atraumatic without obvious abnormalities. No apparent alopecia or balding. Eyes:  pupils equal, pupils round, pupils reactive to light Ears:  R ear normal and L ear normal.   Mouth:  no erythema and no lesions.   Neck:  supple, no masses, no thyromegaly, no carotid bruits, and no cervical lymphadenopathy.   Lungs:  normal respiratory effort, no intercostal retractions, no accessory muscle use, and normal breath sounds.   Heart:  normal rate, regular rhythm, no murmur, and no gallop.   Neurologic:  CN 2-12 intact, station intact, gait intact, no romberg, strength and sensation intact, able to do tandem stance without imbalance. Skin:  no suspicious lesions and no ulcerations.     Impression & Recommendations:  Problem # 1:  DIZZINESS (ICD-780.4) sounds like presyncope.  check basic blood work.  nonfocal neurological exam.  Obtained EKG - concern with 1st degree AV block, ? of Mobitz type II.  Discussed with Dr. Mariah Milling, doubt Mobitz II, rec watch for now, if continued episodes could consider holter/event monitor.  Called  patient and discussed this, he prefers to f/u with PCP on Wednesday.  EKG - NSR 75, 1st degree AV block, 1 nonconducted PAC?, LAD, no hypertrophy, t wave flattening lateral leads, actually reviewing other EKGs, unchaged 1st degree block compared to previous, slight more flatteing of lateral T waves (2008, 2010)  Orders: EKG w/ Interpretation (93000) TLB-CBC Platelet - w/Differential (85025-CBCD) TLB-BMP (Basic Metabolic Panel-BMET) (80048-METABOL) TLB-Hepatic/Liver Function Pnl (80076-HEPATIC) TLB-TSH (Thyroid Stimulating Hormone) (84443-TSH)  Complete Medication List: 1)  Alprazolam 1 Mg Tb24 (Alprazolam) .... Take 1/2 - 1  by mouth two times a day as needed for nerves 2)  Metformin Hcl 1000 Mg Tabs (Metformin hcl) .... Take one by mouth before breakfast and dinner and 1/2 at bedtime 3)  Glimepiride 4 Mg Tabs (Glimepiride) .... Take one by mouth two times a day 4)  Verapamil Hcl Cr 240 Mg Cp24 (Verapamil hcl) .... Take one by mouth once a day 5)  Lisinopril 40 Mg Tabs (Lisinopril) .Marland Kitchen.. 1 daily 6)  Aspirin 81 Mg Tbec (Aspirin) .... Take one by mouth once a day 7)  Fish Oil 1000 Mg Caps (Omega-3 fatty acids) .... 2 pills a day 8)  Hydrochlorothiazide 12.5 Mg Caps (Hydrochlorothiazide) .... One daily for blood pressure  Patient Instructions: 1)  Follow up with myself or Dr. Alphonsus Sias next week.   2)  Return sooner if starting to have more of these episodes. 3)  Start hydrochlorothiazide 12.5mg  daily.  I'd like [place] to back off the verapamil which could be causing your heart to have slight blocked conduction.  Go down to 1/2 pill of verapamil daily. 4)  Good to meet you today, call clinic with quesitons Prescriptions: HYDROCHLOROTHIAZIDE 12.5 MG CAPS (HYDROCHLOROTHIAZIDE) one daily for blood pressure  #30 x 1   Entered and Authorized by:   Eustaquio Boyden  MD   Signed by:   Eustaquio Boyden  MD on 07/30/2010   Method used:   Electronically to        [facility]* (retail)       [address]       [place], [place]  [address]       Ph: [phone]       Fax: [phone]   RxID:   [id number]   Current Allergies (reviewed today): No known allergies

## 2010-11-29 NOTE — Assessment & Plan Note (Signed)
Summary: FOLLOW UP / LFW   Vital Signs:  Patient profile:   75 year old male Height:      67 inches Weight:      199.50 pounds BMI:     31.36 Temp:     98.6 degrees F oral Pulse rate:   89 / minute Pulse rhythm:   regular BP sitting:   133 / 77  (left arm) Cuff size:   large  Vitals Entered By: Linde Gillis CMA Duncan Dull) (November 21, 2010 7:54 AM) CC: 6 month follow up   History of Present Illness: Still occ gets flushing--- tends to be 3-5PM in evenings Intermittent but up to 3/4rds of the time uses ice pack and it helps but stays till bedtime. Gone by AM  No chest pain No SOB No palpitations Notes decreased stamina--like cutting down trees. 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  Past Medical History: Reviewed history from 06/13/2009 and no changes required. Anxiety Diabetes mellitus, type II with neuropathy GERD Hypertension Peptic ulcer disease Rosacea Hyperlipidemia Left renal cyst  Past Surgical History: Reviewed history from 05/27/2007 and no changes required. Cholecystectomy 1993 Partial gastrectomy- ulcer 1974 CP-Stress test okay 1980's  Family History: Reviewed history from 05/27/2007 and no changes required. Father--didn't know, died of MI? Mother: Died at age 98, leukemia Siblings: 4 Brothers, one deceased at age 4 with MI, 3 living.  4 Sisters, one deceased at age 73 with breast cancer, 3  living CAD-- Dad (?), brother HTN and DM in sibs Prostate cancer, one brother Breast/Ovarian/Uterine IT consultant  Social History: Reviewed history from 05/27/2007 and no changes required. 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   Neck:  supple, no masses, no thyromegaly, no carotid bruits, and no cervical lymphadenopathy.   Lungs:  normal respiratory effort, no intercostal retractions, no accessory muscle use, and normal breath sounds.   Heart:  normal rate, regular rhythm, no murmur, and no gallop.   Msk:  no joint tenderness and no joint swelling.   Pulses:  1+ in feet Extremities:  No edema Neurologic:  alert & oriented X3, strength normal in all extremities, and gait normal.   Skin:  no rashes, no suspicious lesions, and no ulcerations.   Psych:  normally interactive, good eye contact, not anxious appearing, and not depressed appearing.    Diabetes Management Exam:    Foot Exam (with socks and/or shoes not present):       Sensory-Pinprick/Light touch:          Left medial foot (L-4): diminished          Left dorsal foot (L-5): diminished          Left lateral foot (S-1): diminished  Right medial foot (L-4): diminished          Right dorsal foot (L-5): diminished          Right lateral foot (S-1): diminished       Inspection:          Left foot: normal          Right foot: normal       Nails:          Left foot: normal          Right foot: thickened   Impression & Recommendations:  Problem # 1:  DIABETES MELLITUS, TYPE II, WITH NEUROLOGICAL COMPLICATIONS (ICD-250.60) Assessment Unchanged doing well no changes needed  His updated medication list for this problem includes:    Metformin Hcl 1000 Mg  Tabs (Metformin hcl) .Marland Kitchen... Take one by mouth before breakfast and dinner and 1/2 at bedtime    Glimepiride 4 Mg Tabs (Glimepiride) .Marland Kitchen... Take one by mouth two times a day    Lisinopril 40 Mg Tabs (Lisinopril) .Marland Kitchen... 1 daily    Aspirin 81 Mg Tbec (Aspirin) .Marland Kitchen... Take one by mouth once a day  Labs Reviewed: Creat: 1.0 (07/30/2010)     Last Eye Exam: NO retinopathy (10/11/2009) Reviewed HgBA1c results: 6.9 (11/21/2010)  6.9 (11/17/2010)  Problem # 2:  HYPERLIPIDEMIA (ICD-272.4) Assessment: Unchanged Reasonable levels without meds  Labs Reviewed: SGOT: 24 (07/30/2010)   SGPT: 21 (07/30/2010)   HDL:35.40 (06/13/2009), 30.1 (12/20/2008)  LDL:109 (11/17/2010), 102 (06/13/2009)  Chol:164 (06/13/2009), 182 (12/20/2008)  Trig:135.0 (06/13/2009), 305 (12/20/2008)  Problem # 3:  HYPERTENSION (ICD-401.9) Assessment: Unchanged good control Creat 0.91 on recent blood work  no changes needed  His updated medication list for this problem includes:    Verapamil Hcl Cr 240 Mg Cp24 (Verapamil hcl) .Marland Kitchen... Take one by mouth once a day    Lisinopril 40 Mg Tabs (Lisinopril) .Marland Kitchen... 1 daily  BP today: 133/77 Prior BP: 142/80 (08/01/2010)  Labs Reviewed: K+: 4.9 (07/30/2010) Creat: : 1.0 (07/30/2010)   Chol: 164 (06/13/2009)   HDL: 35.40 (06/13/2009)   LDL: 109 (11/17/2010)   TG: 135.0 (06/13/2009)  Problem # 4:  ANXIETY (ICD-300.00) Assessment: Unchanged still needs occ meds  His updated medication list for this problem includes:    Alprazolam 1 Mg Tb24 (Alprazolam) .Marland Kitchen... Take 1/2 - 1  by mouth two times a day as needed for nerves  Complete Medication List: 1)  Alprazolam 1 Mg Tb24 (Alprazolam) .... Take 1/2 - 1  by mouth two times a day as needed for nerves 2)  Metformin Hcl 1000 Mg Tabs (Metformin hcl) .... Take one by mouth before breakfast and dinner and 1/2 at bedtime 3)  Glimepiride 4 Mg Tabs (Glimepiride) .... Take one by mouth two times a day 4)  Verapamil Hcl Cr 240 Mg Cp24 (Verapamil  hcl) .... Take one by mouth once a day 5)  Lisinopril 40 Mg Tabs (Lisinopril) .Marland Kitchen.. 1 daily 6)  Aspirin 81 Mg Tbec (Aspirin) .... Take one by mouth once a day 7)  Fish Oil 1000 Mg Caps (Omega-3 fatty acids) .... 2 pills a day  Patient Instructions: 1)  Please schedule a follow-up appointment in 6 months .    Orders Added: 1)  Est. Patient Level IV [16109]    Current Allergies (reviewed today): No known allergies

## 2011-03-15 NOTE — Cardiovascular Report (Signed)
[facility]  Patient:    Patrick [name], Patrick [name]                     MRN: [id number] Proc. Date: 04/04/00 Adm. Date:  [id number] Disc. Date: [id number] Attending:  [place], Peter Manning CC:         Loma Sender, M.D., [facility]                        Cardiac Catheterization  INDICATIONS FOR PROCEDURE:  The patient is a 75 year old white male with a strong family history of coronary disease.  He also has glucose intolerance, hypertension, hypercholesterolemia.  He presents with new onset of anginal symptoms.  ACCESS:  Via the right femoral artery using the standard Seldinger technique.  EQUIPMENT:  6 French 4 cm right and left Judkins catheter, 6 French pigtail catheter, 6 French arterial sheath.  MEDICATIONS:  Local anesthesia with 1% Xylocaine, Versed 2 mg IV.  CONTRAST:  Omnipaque 125 cc.  HEMODYNAMIC DATA:  Aortic pressure is 114/74 with a mean of 91 mmHg.  Left ventricular pressure is 113 with an EDP of 13 mmHg.  ANGIOGRAPHIC DATA:  Left coronary artery:  The left coronary artery arises and distributes normally.  Left main:  The left main coronary artery is normal.  Left anterior descending:  The left anterior descending artery has a 30% narrowing proximally.  Otherwise no significant disease.  The first diagonal branch appears normal.  There is a large ramus intermediate branch which appears normal.  Left circumflex:  The left circumflex coronary artery appears normal.  Right coronary artery:  The right coronary artery arises and distributes normally and is a dominant vessel.  It has minor wall irregularities up to 10% in the mid vessel.  LEFT VENTRICULAR ANGIOGRAPHY:  The left ventricular angiography demonstrates normal left ventricular size and contractility with normal systolic function. Ejection fraction is estimated at 60%.  There is no mitral regurgitation or prolapse.  The aortic valve appears normal.  FINAL  INTERPRETATION: 1. Minor nonobstructive atherosclerotic coronary artery disease. 2. Normal left ventricular function.  PLAN:  Would continue risk factor modification. DD:  04/04/00 TD:  04/09/00 Job: [id number]

## 2011-03-15 NOTE — H&P (Signed)
. [facility]  Patient:    Patrick [name], Patrick [name]                       MRN: [id number] Adm. Date:  04/04/00 Attending:  Peter M. [place], M.D. Dictator:   Peter M. [place], M.D. CC:         Raliegh Ip, MD IN [place]   History and Physical  DATE OF BIRTH:  1936-10-06  CHIEF COMPLAINT:  Chest pain.  HISTORY OF PRESENT ILLNESS:  The patient is a 75 year old white male who has multiple cardiac risk factors who presents with a new onset of chest pain.  He states that over the past week he has been experiencing left precordial chest pain associated with nausea and fatigue.  Associated with a tightness in his chest.  He in fact had an episode of pain on the way to our office today. Note, the patient had been evaluated one year ago with a stress Cardiolite study due to his multiple risk factors, but at that time he was asymptomatic. This demonstrated no significant ischemia.  However, because of his new onset of symptoms and his multiple risk factors, it was felt that he should undergo coronary angiography, and he is admitted for that procedure.  PAST MEDICAL HISTORY: 1. Hypertension. 2. Glucose intolerance. 3. Hypercholesterolemia. 4. History of peptic ulcer disease with a bleeding ulcer. 5. Status post partial gastrectomy. 6. He has had previous cholecystectomy. 7. Knee surgery. 8. History of panic attacks. 9. History of hiatal hernia.  ALLERGIES:  No known drug allergies.  CURRENT MEDICATIONS: 1. Aspirin daily. 2. Verapamil 240 mg q.d. 3. Zestril 20 mg q.d. 4. Baycol 0.4 mg q.d.  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  He denies tobacco or alcohol use.  He is single and has two children by a prior marriage.  FAMILY HISTORY:  Father died at age 40 of myocardial infarction.  Mother died at age 26 with leukemia.  Two brothers have had coronary artery bypass surgery, and another has coronary  artery disease as well.  There is also a family history of hypertension.  REVIEW OF SYSTEMS:  The patient has a history of a large renal cyst, is followed by Dr. Aldean Ast.  He denies any other complaints.  PHYSICAL EXAMINATION:  VITAL SIGNS:  Weight is 226.5, blood pressure 150/90, pulse 102 and regular, respirations 20.  HEENT:  Pupils are equal, round and reactive.  Conjunctivae are clear. Oropharynx is clear.  NECK:  Supple without JVD, adenopathy, or bruits.  LUNGS:  Clear.  CARDIAC:  Regular rate and rhythm without murmurs, rubs, or gallops.  ABDOMEN:  Soft and nontender.  He has no masses or bruits.  EXTREMITIES:  Without edema.  Pulses 2+ and symmetric.  NEUROLOGIC:  Nonfocal.  SKIN:  Warm and dry.  LABORATORY DATA:  ECG shows normal sinus rhythm with left axis deviation, nonspecific T wave abnormality.  Chest x-ray shows no active disease.  ASSESSMENT: 1. New onset of chest pain consistent with angina. 2. Hypertension. 3. Hypercholesterolemia. 4. Glucose intolerance. 5. History of large renal cyst. 6. History of peptic ulcer disease, status post partial gastrectomy.  PLAN:  The patient will be admitted for cardiac catheterization. DD:  04/02/00 TD:  04/02/00 Job: [id number] [id number]/[id number]

## 2011-03-27 IMAGING — US US RENAL
1 series · 14 of 25 positions shown · non-contrast
Comparison: 12/05/2006

CLINICAL DATA: Left renal cyst. Diabetes.  Hypertension.

RENAL/URINARY TRACT ULTRASOUND COMPLETE

[Series 1: us renal · 0.30mm/px · 27 acquisitions, 14 frames shown]
[im 1/27]
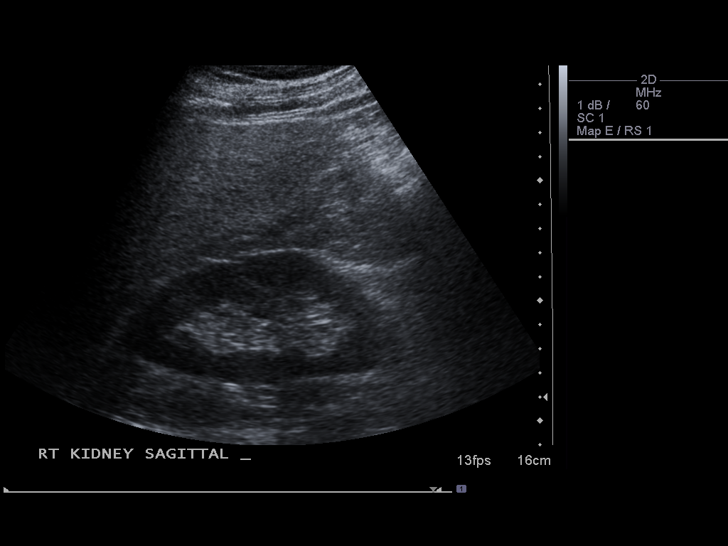
[im 3/27]
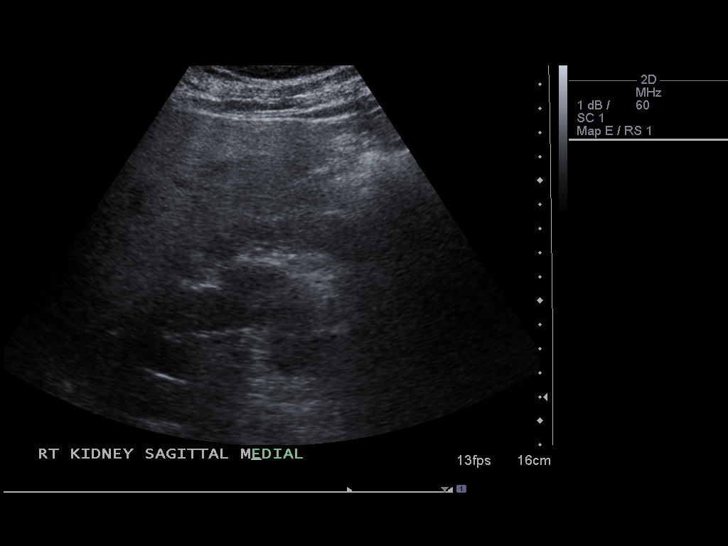
[im 5/27]
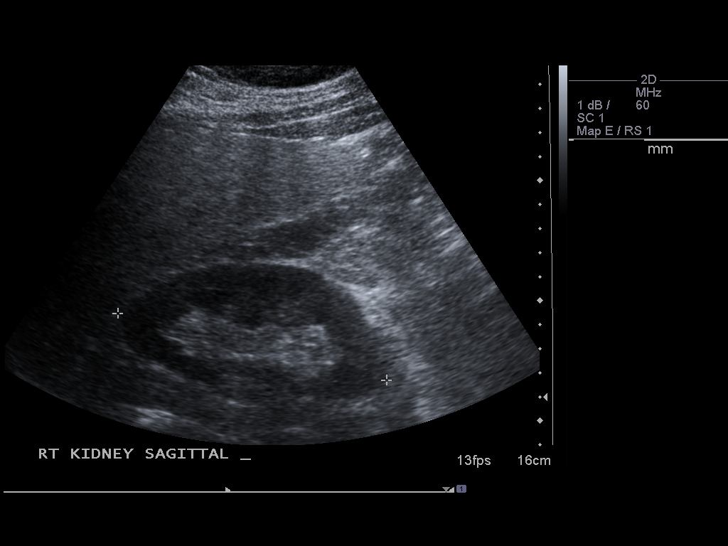
[im 7/27]
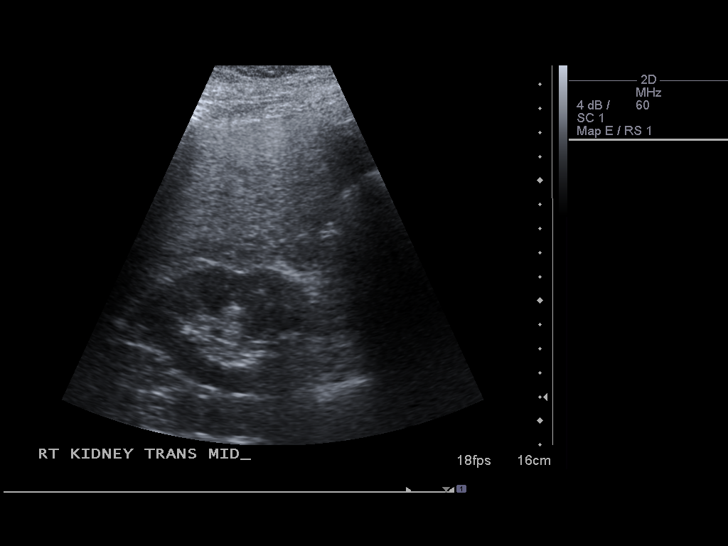
[im 9/27]
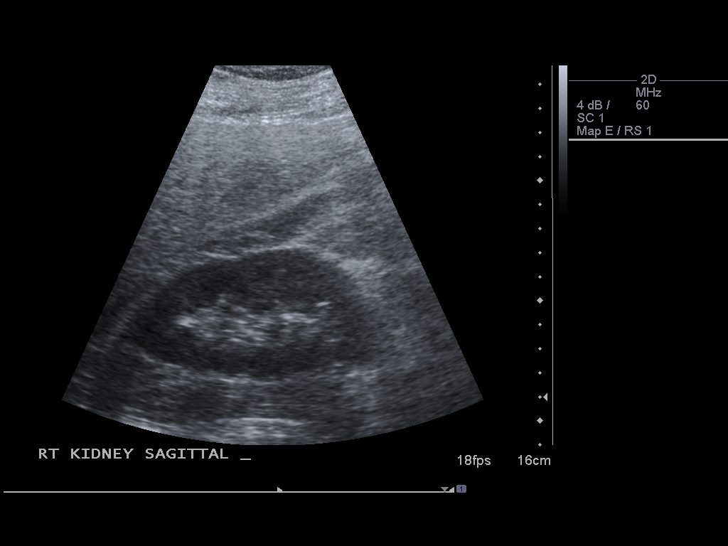
[im 10/27]
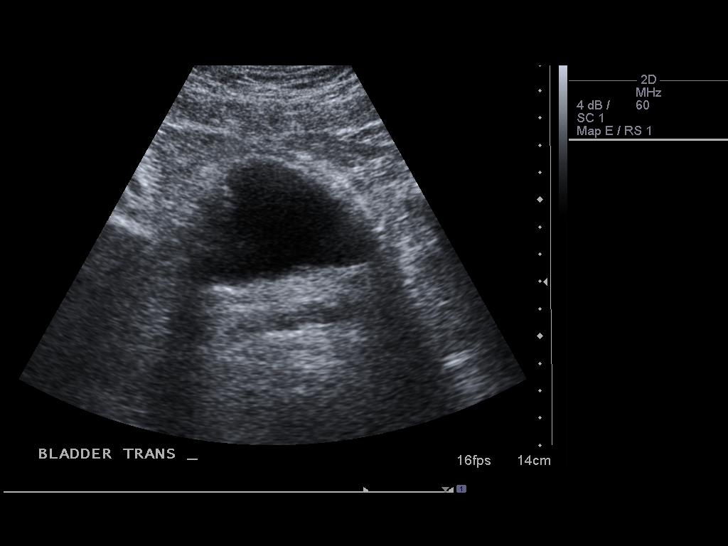
[im 12/27]
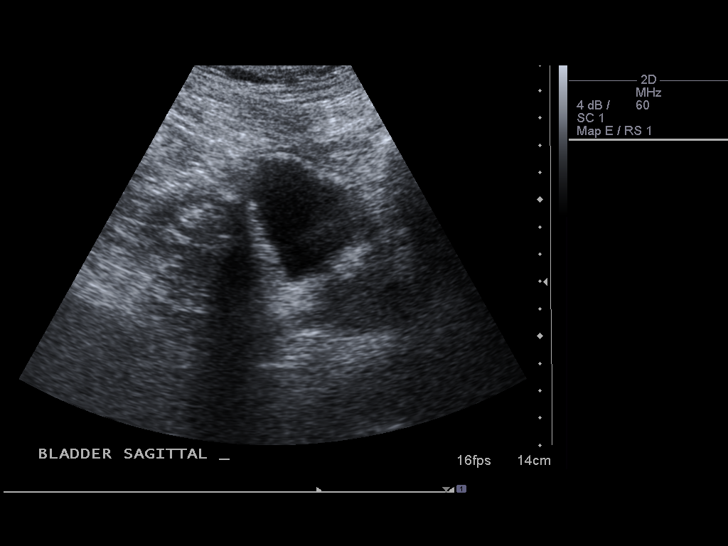
[im 15/27]
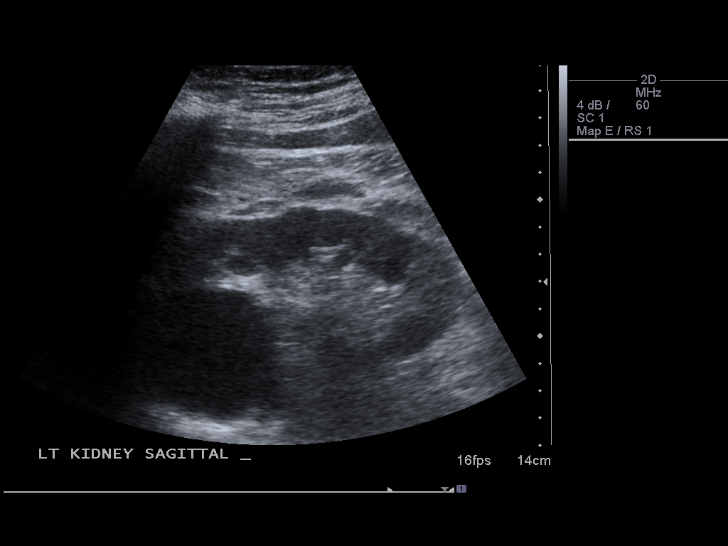
[im 17/27]
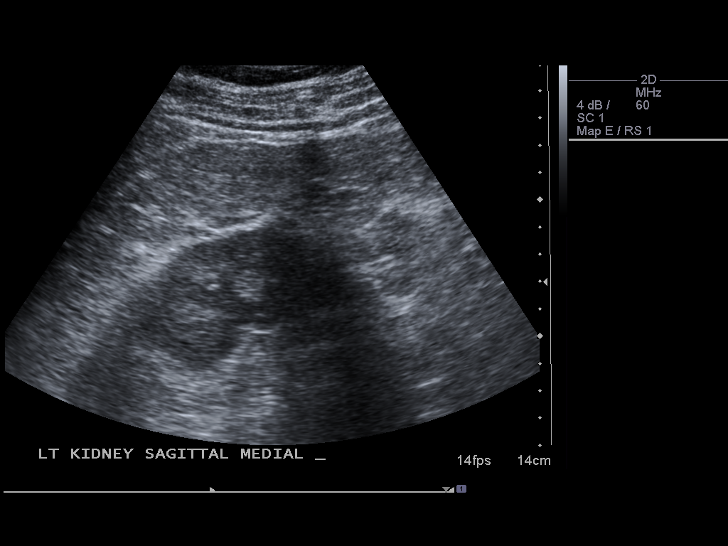
[im 18/27]
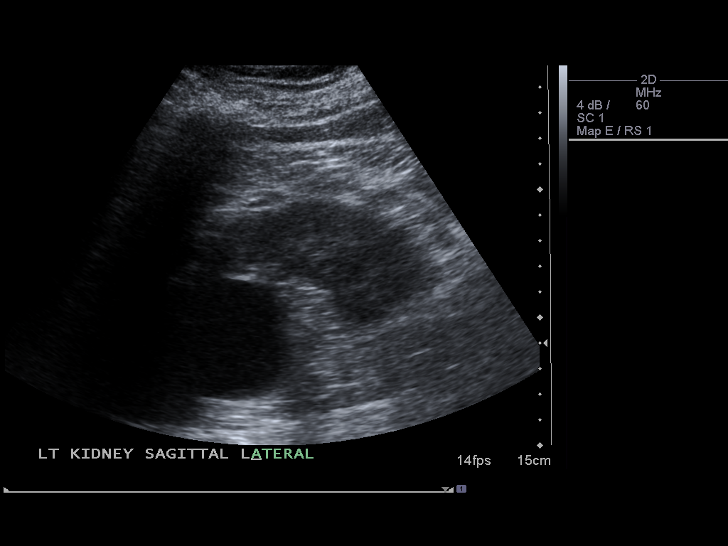
[im 20/27]
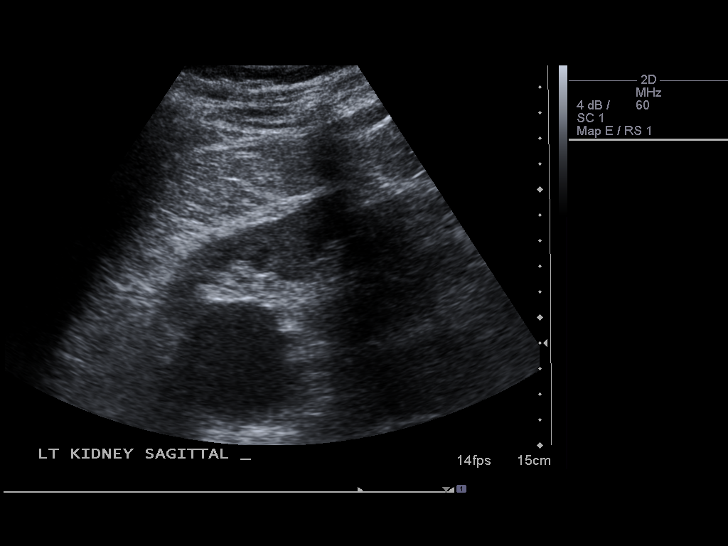
[im 22/27]
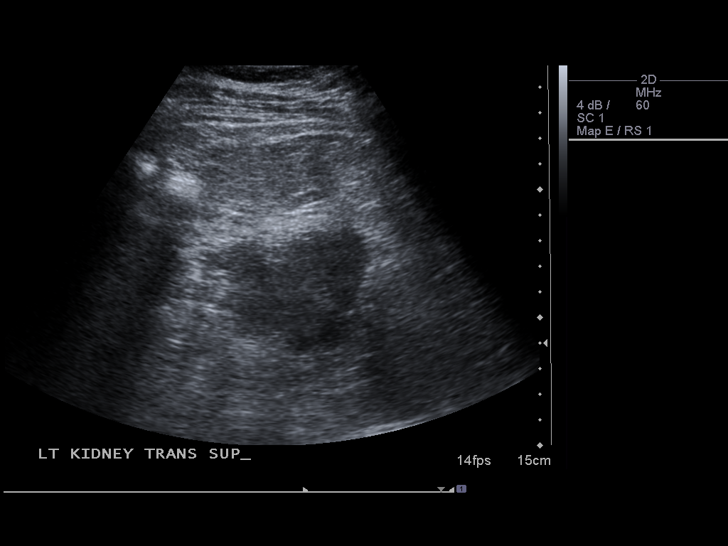
[im 24/27]
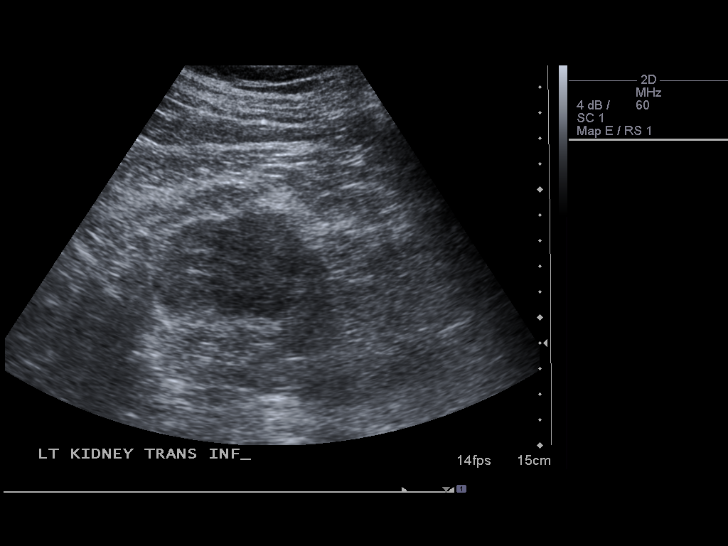
[im 27/27]
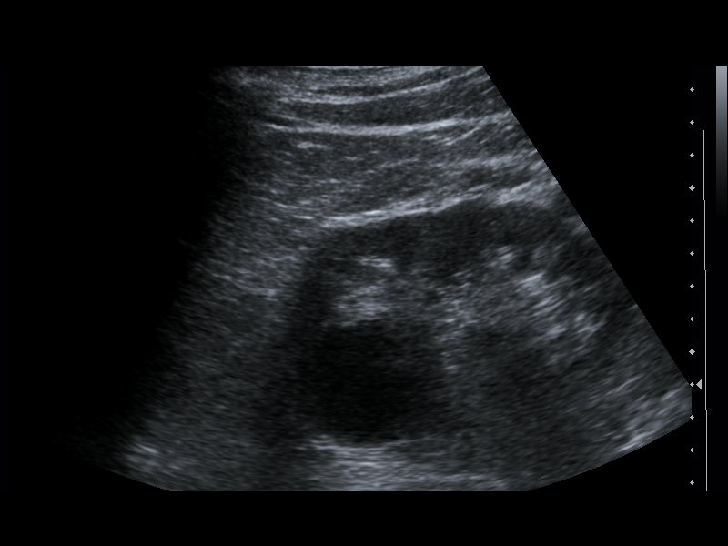

[14 of 25 positions shown; findings below may reference images not displayed]

FINDINGS: Right Kidney:  11.5 cm length

Left Kidney:  11.7 cm length.  Again appreciated is a large cyst in
the mid to upper pole of the left kidney measuring 8.4 x 4.2 x
cm. No septations or solid mural components.

Bilaterally there is no hydronephrosis.  There is normal renal
parenchymal echotexture and echogenicity.

Bladder:  Normal
IMPRESSION: Stable left renal cyst.

## 2011-04-09 ENCOUNTER — Encounter: Payer: Self-pay | Admitting: Internal Medicine

## 2011-04-10 ENCOUNTER — Ambulatory Visit (INDEPENDENT_AMBULATORY_CARE_PROVIDER_SITE_OTHER): Payer: Medicare Other | Admitting: Internal Medicine

## 2011-04-10 ENCOUNTER — Encounter: Payer: Self-pay | Admitting: Internal Medicine

## 2011-04-10 VITALS — BP 138/88 | HR 94 | Temp 97.9°F | Ht 67.0 in | Wt 193.0 lb

## 2011-04-10 DIAGNOSIS — S76019A Strain of muscle, fascia and tendon of unspecified hip, initial encounter: Secondary | ICD-10-CM

## 2011-04-10 DIAGNOSIS — IMO0002 Reserved for concepts with insufficient information to code with codable children: Secondary | ICD-10-CM

## 2011-04-10 NOTE — Progress Notes (Signed)
  Subjective:    Patient ID: Patrick [name], male    DOB: 1936-01-26, 75 y.o.   MRN: [id number]  HPI Sugars have been good Has lost some more weight--a couple of pounds Usually under 130  Lifted leaf blower off truck 4 weeks ago Noted bulge in right upper abdomen but not painful  Has pain in right hip May have knot over anterior superior iliac spine Pain started about 1 week after the possible injury Hurts most when first getting up and improves as he moves about Worsened it after mowing ditch out with push mower Hasn't really tried any meds  Current Outpatient Prescriptions on File Prior to Visit  Medication Sig Dispense Refill  . ALPRAZolam (XANAX) 1 MG tablet Take 0.5-1 mg by mouth 2 (two) times daily as needed.        Marland Kitchen aspirin 81 MG tablet Take 81 mg by mouth daily.        . fish oil-omega-3 fatty acids 1000 MG capsule Take 2 g by mouth daily.        Marland Kitchen glimepiride (AMARYL) 4 MG tablet Take 4 mg by mouth 2 (two) times daily.        Marland Kitchen lisinopril (PRINIVIL,ZESTRIL) 40 MG tablet Take 40 mg by mouth daily.        . metFORMIN (GLUCOPHAGE) 1000 MG tablet Take 1,000 mg by mouth 2 (two) times daily with a meal. ,then take 1/2 tab at bedtime       . verapamil (CALAN-SR) 240 MG CR tablet Take 240 mg by mouth at bedtime.         Past Medical History  Diagnosis Date  . Anxiety   . Diabetes mellitus   . GERD (gastroesophageal reflux disease)   . Hypertension   . Peptic ulcer disease   . Rosacea   . Hyperlipidemia   . Renal cyst     Past Surgical History  Procedure Date  . Cholecystectomy   . Partial gastrectomy 1974    Family History  Problem Relation Age of Onset  . Cancer Brother     prostate    History   Social History  . Marital Status: Married    Spouse Name: N/A    Number of Children: 2  . Years of Education: N/A   Occupational History  . RETIRED, electrician    Social History Main Topics  . Smoking status: Never Smoker   . Smokeless tobacco: Never Used   . Alcohol Use: No  . Drug Use: Not on file  . Sexually Active: Not on file   Other Topics Concern  . Not on file   Social History Narrative  . No narrative on file   Review of Systems No leg weakness No change in bowel or bladder habits    Objective:   Physical Exam  Abdominal: Soft. There is no tenderness.       Slight prominence of rectus muscles upon sitting up. Slight tenderness to right of midline but no clear ventral hernia  Musculoskeletal:       Restricted internal rotation of both hips Pain over greater trochanter but no sig bony tenderness--more prominent with external rotation  (though this is full passively)  Neurological:       Strength and gait normal          Assessment & Plan:

## 2011-04-10 NOTE — Assessment & Plan Note (Signed)
Seems to be mild injury Has findings suggestive of baseline arthritis, but injury is probably in soft tissues Discussed heat, tylenol

## 2011-04-10 NOTE — Patient Instructions (Addendum)
Try heat on your hip if it hurts Try acetaminophen 500 or 650mg  up to three times daily for pain Please keep the July appointment

## 2011-04-15 ENCOUNTER — Telehealth: Payer: Self-pay | Admitting: Gastroenterology

## 2011-04-15 NOTE — Telephone Encounter (Signed)
Patient is concerned about his blood sugars during the prep time.  I have advised him he will hold his diabetes oral meds while he is taking his prep..  Patient is scheduled for colon 04/23/11 11:30, pre-visit 04/16/11 9:00

## 2011-04-16 ENCOUNTER — Encounter: Payer: Self-pay | Admitting: Gastroenterology

## 2011-04-16 ENCOUNTER — Ambulatory Visit (AMBULATORY_SURGERY_CENTER): Payer: Medicare Other | Admitting: *Deleted

## 2011-04-16 VITALS — Ht 68.0 in | Wt 194.6 lb

## 2011-04-16 DIAGNOSIS — Z1211 Encounter for screening for malignant neoplasm of colon: Secondary | ICD-10-CM

## 2011-04-16 MED ORDER — PEG-KCL-NACL-NASULF-NA ASC-C 100 G PO SOLR: ORAL | Status: DC

## 2011-04-23 ENCOUNTER — Encounter: Payer: Self-pay | Admitting: Gastroenterology

## 2011-04-23 ENCOUNTER — Ambulatory Visit (AMBULATORY_SURGERY_CENTER): Payer: Medicare Other | Admitting: Gastroenterology

## 2011-04-23 VITALS — BP 143/87 | HR 82 | Temp 95.7°F | Resp 13 | Ht 68.0 in | Wt 193.0 lb

## 2011-04-23 DIAGNOSIS — Z1211 Encounter for screening for malignant neoplasm of colon: Secondary | ICD-10-CM

## 2011-04-23 LAB — GLUCOSE, CAPILLARY
Glucose-Capillary: 147 mg/dL — ABNORMAL HIGH (ref 70–99)
Glucose-Capillary: 142 mg/dL — ABNORMAL HIGH (ref 70–99)

## 2011-04-23 MED ORDER — SODIUM CHLORIDE 0.9 % IV SOLN: 500.0000 mL | INTRAVENOUS | Status: DC

## 2011-04-23 NOTE — Patient Instructions (Signed)
Please read the handout given to your by your recovery room nurse.   Increase the fiber in your diet due to your hemorrhoids.  If you have any questions or concerns, please call us at [phone].  Thank-you.

## 2011-04-24 ENCOUNTER — Telehealth: Payer: Self-pay | Admitting: *Deleted

## 2011-04-24 NOTE — Telephone Encounter (Signed)
No message left. Answering machine did not pick up after 7 rings

## 2011-05-15 ENCOUNTER — Encounter: Payer: Self-pay | Admitting: Internal Medicine

## 2011-05-15 ENCOUNTER — Ambulatory Visit (INDEPENDENT_AMBULATORY_CARE_PROVIDER_SITE_OTHER): Payer: Medicare Other | Admitting: Internal Medicine

## 2011-05-15 DIAGNOSIS — E1149 Type 2 diabetes mellitus with other diabetic neurological complication: Secondary | ICD-10-CM

## 2011-05-15 DIAGNOSIS — I10 Essential (primary) hypertension: Secondary | ICD-10-CM

## 2011-05-15 DIAGNOSIS — E785 Hyperlipidemia, unspecified: Secondary | ICD-10-CM

## 2011-05-15 DIAGNOSIS — K219 Gastro-esophageal reflux disease without esophagitis: Secondary | ICD-10-CM

## 2011-05-15 DIAGNOSIS — R079 Chest pain, unspecified: Secondary | ICD-10-CM

## 2011-05-15 LAB — HEPATIC FUNCTION PANEL
ALT: 23 U/L (ref 0–53)
AST: 29 U/L (ref 0–37)
Albumin: 4.6 g/dL (ref 3.5–5.2)
Alkaline Phosphatase: 55 U/L (ref 39–117)
Total Bilirubin: 0.6 mg/dL (ref 0.3–1.2)

## 2011-05-15 LAB — BASIC METABOLIC PANEL
BUN: 20 mg/dL (ref 6–23)
Chloride: 107 mEq/L (ref 96–112)
GFR: 75.3 mL/min (ref >60.00)
Potassium: 5.6 mEq/L — ABNORMAL HIGH (ref 3.5–5.1)
Sodium: 145 mEq/L (ref 135–145)

## 2011-05-15 LAB — MICROALBUMIN / CREATININE URINE RATIO
Creatinine,U: 328.8 mg/dL
Microalb Creat Ratio: 0.4 mg/g (ref 0.0–30.0)
Microalb, Ur: 1.4 mg/dL (ref 0.0–1.9)

## 2011-05-15 LAB — CBC WITH DIFFERENTIAL/PLATELET
Basophils Relative: 0.3 % (ref 0.0–3.0)
Eosinophils Relative: 2.9 % (ref 0.0–5.0)
HCT: 46.9 % (ref 39.0–52.0)
Hemoglobin: 15.9 g/dL (ref 13.0–17.0)
Lymphs Abs: 2.6 10*3/uL (ref 0.7–4.0)
MCV: 95.1 fl (ref 78.0–100.0)
Monocytes Relative: 8.4 % (ref 3.0–12.0)
Platelets: 214 10*3/uL (ref 150.0–400.0)
RBC: 4.93 Mil/uL (ref 4.22–5.81)
WBC: 6.3 10*3/uL (ref 4.5–10.5)

## 2011-05-15 LAB — LIPID PANEL: Cholesterol: 165 mg/dL (ref 0–200)

## 2011-05-15 LAB — HEMOGLOBIN A1C: Hgb A1c MFr Bld: 7.7 % — ABNORMAL HIGH (ref 4.6–6.5)

## 2011-05-15 LAB — TSH: TSH: 1.86 u[IU]/mL (ref 0.35–5.50)

## 2011-05-15 NOTE — Progress Notes (Signed)
Subjective:    Patient ID: Patrick [name], male    DOB: Nov 26, 1935, 75 y.o.   MRN: [id number]  HPI Still has some hip pain but is better Bothers him if he mows lawn, picking grass Acetaminophen helps--discussed using it before the work  [employer] "like I have no energy... I have to push myself to do anything" ??hot weather--but maybe from before then  Checks sugars every AM Mostly 100-130. Occ under 100 or up to 150 No hypoglycemic reactions  No SOB Occ gets chest pain that he relates to acid reflux---not exertional--but with "funny feeling" Uses gaviscon regularly No edema  Anxiety is okay Uses the alprazolam bid prn---probably about half the time  Current Outpatient Prescriptions on File Prior to Visit  Medication Sig Dispense Refill  . ACCU-CHEK COMPACT TEST DRUM test strip 1 each as needed.       . ALPRAZolam (XANAX) 1 MG tablet Take 0.5-1 mg by mouth 2 (two) times daily as needed.        Marland Kitchen aspirin 81 MG tablet Take 81 mg by mouth daily.        . fish oil-omega-3 fatty acids 1000 MG capsule Take 2 g by mouth daily.        Marland Kitchen glimepiride (AMARYL) 4 MG tablet Take 4 mg by mouth 2 (two) times daily.        Marland Kitchen latanoprost (XALATAN) 0.005 % ophthalmic solution Place 1 drop into both eyes Daily.      Marland Kitchen lisinopril (PRINIVIL,ZESTRIL) 40 MG tablet Take 40 mg by mouth daily.        . metFORMIN (GLUCOPHAGE) 1000 MG tablet Take 1,000 mg by mouth 2 (two) times daily with a meal. ,then take 1/2 tab at bedtime       . verapamil (CALAN-SR) 240 MG CR tablet Take 240 mg by mouth at bedtime.         Current Facility-Administered Medications on File Prior to Visit  Medication Dose Route Frequency Provider Last Rate Last Dose  . 0.9 %  sodium chloride infusion  500 mL Intravenous Continuous Meryl Dare, MD,FACG        No Known Allergies  Past Medical History  Diagnosis Date  . Anxiety   . Diabetes mellitus   . GERD (gastroesophageal reflux disease)   . Hypertension   . Peptic ulcer  disease   . Rosacea   . Hyperlipidemia   . Renal cyst   . Irregular heartbeat     Dr. (his) stated that it was "nothing to worry about."    Past Surgical History  Procedure Date  . Cholecystectomy   . Partial gastrectomy 1974  . Knee arthroscopy     left    Family History  Problem Relation Age of Onset  . Cancer Brother     prostate  . Colon cancer Brother     History   Social History  . Marital Status: Married    Spouse Name: N/A    Number of Children: 2  . Years of Education: N/A   Occupational History  . RETIRED, electrician    Social History Main Topics  . Smoking status: Never Smoker   . Smokeless tobacco: Never Used  . Alcohol Use: No  . Drug Use: Not on file  . Sexually Active: Not on file   Other Topics Concern  . Not on file   Social History Narrative  . No narrative on file   Review of Systems Had colonoscopy and everything was fine  Sleeps well---wakes refreshed and able to work well till [employer] (then "gives out") Weight is stable occ burning in feet--not persistent    Objective:   Physical Exam  Constitutional: He appears well-developed and well-nourished. No distress.  Neck: Normal range of motion. Neck supple. No thyromegaly present.  Cardiovascular: Normal rate, regular rhythm, normal heart sounds and intact distal pulses.  Exam reveals no gallop.   No murmur heard. Pulmonary/Chest: Effort normal and breath sounds normal. No respiratory distress. He has no wheezes. He has no rales.  Abdominal: Soft. He exhibits no mass. There is no tenderness.  Musculoskeletal: Normal range of motion. He exhibits no edema and no tenderness.  Lymphadenopathy:    He has no cervical adenopathy.  Skin: No rash noted.       Np foot ulcers or lesions  Psychiatric: He has a normal mood and affect. His behavior is normal. Judgment and thought content normal.          Assessment & Plan:

## 2011-05-15 NOTE — Assessment & Plan Note (Signed)
BP Readings from Last 3 Encounters:  05/15/11 128/80  04/23/11 143/87  04/10/11 138/88   Good control No changes

## 2011-05-15 NOTE — Assessment & Plan Note (Addendum)
Sounds atypical May be GI  EKG shows 1st degree A-V block with LAHB No ischemic changes or sig change from last year Will add ranitidine

## 2011-05-15 NOTE — Patient Instructions (Signed)
Please start over the counter ranitidine for acid symptoms. Take every day at bedtime and also in the morning if needed

## 2011-05-15 NOTE — Assessment & Plan Note (Signed)
Lab Results  Component Value Date   LDLCALC 109 11/17/2010   Not bad but not at goal on just fish oil Discussed statin---he prefers not to take Will start only if LDL is >130

## 2011-05-15 NOTE — Assessment & Plan Note (Signed)
May be causing his chest pain Will have him start ranitidine

## 2011-05-15 NOTE — Assessment & Plan Note (Signed)
Seems to have good control Will check labs Early neuropathy in feet

## 2011-05-21 ENCOUNTER — Encounter: Payer: Self-pay | Admitting: *Deleted

## 2011-06-24 ENCOUNTER — Other Ambulatory Visit (INDEPENDENT_AMBULATORY_CARE_PROVIDER_SITE_OTHER): Payer: Medicare Other | Admitting: Internal Medicine

## 2011-06-24 DIAGNOSIS — E875 Hyperkalemia: Secondary | ICD-10-CM

## 2011-07-17 ENCOUNTER — Ambulatory Visit (INDEPENDENT_AMBULATORY_CARE_PROVIDER_SITE_OTHER): Payer: Medicare Other | Admitting: *Deleted

## 2011-07-17 DIAGNOSIS — Z23 Encounter for immunization: Secondary | ICD-10-CM

## 2011-07-18 ENCOUNTER — Encounter: Payer: Self-pay | Admitting: Internal Medicine

## 2011-11-20 ENCOUNTER — Ambulatory Visit: Payer: Medicare Other | Admitting: Internal Medicine

## 2011-11-22 ENCOUNTER — Encounter: Payer: Self-pay | Admitting: Internal Medicine

## 2011-11-22 ENCOUNTER — Ambulatory Visit (INDEPENDENT_AMBULATORY_CARE_PROVIDER_SITE_OTHER): Payer: Medicare Other | Admitting: Internal Medicine

## 2011-11-22 VITALS — BP 120/80 | HR 85 | Temp 98.1°F | Ht 68.0 in | Wt 190.0 lb

## 2011-11-22 DIAGNOSIS — K219 Gastro-esophageal reflux disease without esophagitis: Secondary | ICD-10-CM

## 2011-11-22 DIAGNOSIS — E785 Hyperlipidemia, unspecified: Secondary | ICD-10-CM

## 2011-11-22 DIAGNOSIS — E1149 Type 2 diabetes mellitus with other diabetic neurological complication: Secondary | ICD-10-CM

## 2011-11-22 DIAGNOSIS — F411 Generalized anxiety disorder: Secondary | ICD-10-CM

## 2011-11-22 DIAGNOSIS — I10 Essential (primary) hypertension: Secondary | ICD-10-CM

## 2011-11-22 NOTE — Assessment & Plan Note (Signed)
Hopefully still acceptable control Lab Results  Component Value Date   HGBA1C 7.7* 05/15/2011   Will recheck  Consider gabapentin at night if neuropathic pain worsened

## 2011-11-22 NOTE — Assessment & Plan Note (Signed)
Lab Results  Component Value Date   LDLCALC 91 05/15/2011   Okay without meds

## 2011-11-22 NOTE — Assessment & Plan Note (Signed)
BP Readings from Last 3 Encounters:  11/22/11 120/80  05/15/11 128/80  04/23/11 143/87   Good control Lab Results  Component Value Date   CREATININE 1.0 05/15/2011

## 2011-11-22 NOTE — Assessment & Plan Note (Signed)
Doing some better Only intermittently on meds

## 2011-11-22 NOTE — Progress Notes (Signed)
Subjective:    Patient ID: Patrick [name], male    DOB: [birth year], 76 y.o.   MRN: [id number]  HPI Doing well No new concerns  Checks sugars every morning Usually under 120 and almost always under 140 Occ mild hypoglycemic reactions if he forgets a meal. Better after a fruit or crackers. Just feels a little jittery Keeps up with eye doctor  Still has the burning in his feet Better when he is walking--limited in the cold weather Uses lotion that helps  No clear cut chest pain Uses the ranitidine intermittently Occ uses some gaviscon  No SOB No edema Exercise tolerance is stable Only occ dizziness---he relates to anxiety  Has regular anxiety Uses the xanax once a day and occ bid occ panic attacks since fall from building about 10 years ago  Current Outpatient Prescriptions on File Prior to Visit  Medication Sig Dispense Refill  . ACCU-CHEK COMPACT TEST DRUM test strip 1 each as needed.       . ALPRAZolam (XANAX) 1 MG tablet Take 0.5-1 mg by mouth 2 (two) times daily as needed.        Marland Kitchen aspirin 81 MG tablet Take 81 mg by mouth daily.        . fish oil-omega-3 fatty acids 1000 MG capsule Take 2 g by mouth daily.        Marland Kitchen glimepiride (AMARYL) 4 MG tablet Take 4 mg by mouth 2 (two) times daily.        Marland Kitchen latanoprost (XALATAN) 0.005 % ophthalmic solution Place 1 drop into both eyes Daily.      Marland Kitchen lisinopril (PRINIVIL,ZESTRIL) 40 MG tablet Take 40 mg by mouth daily.        . metFORMIN (GLUCOPHAGE) 1000 MG tablet Take 1,000 mg by mouth 2 (two) times daily with a meal. ,then take 1/2 tab at bedtime       . ranitidine (ZANTAC) 150 MG tablet Take 150 mg by mouth 2 (two) times daily. For acid reflux symptoms       . verapamil (CALAN-SR) 240 MG CR tablet Take 240 mg by mouth at bedtime.         Current Facility-Administered Medications on File Prior to Visit  Medication Dose Route Frequency Provider Last Rate Last Dose  . 0.9 %  sodium chloride infusion  500 mL Intravenous Continuous  Eliezer Bottom., MD,FACG        No Known Allergies  Past Medical History  Diagnosis Date  . Anxiety   . Diabetes mellitus   . GERD (gastroesophageal reflux disease)   . Hypertension   . Peptic ulcer disease   . Rosacea   . Hyperlipidemia   . Renal cyst   . Irregular heartbeat     Dr. (his) stated that it was "nothing to worry about."    Past Surgical History  Procedure Date  . Cholecystectomy   . Partial gastrectomy 1974  . Knee arthroscopy     left    Family History  Problem Relation Age of Onset  . Cancer Brother     prostate  . Colon cancer Brother     History   Social History  . Marital Status: Married    Spouse Name: N/A    Number of Children: 2  . Years of Education: N/A   Occupational History  . RETIRED, electrician    Social History Main Topics  . Smoking status: Never Smoker   . Smokeless tobacco: Never Used  . Alcohol Use:  No  . Drug Use: Not on file  . Sexually Active: Not on file   Other Topics Concern  . Not on file   Social History Narrative  . No narrative on file   Review of Systems Appetite is okay Weight stable Usually sleeps okay    Objective:   Physical Exam  Constitutional: He appears well-developed and well-nourished. No distress.  Neck: Normal range of motion. Neck supple. No thyromegaly present.  Cardiovascular: Normal rate, regular rhythm, normal heart sounds and intact distal pulses.  Exam reveals no gallop.   No murmur heard. Pulmonary/Chest: Effort normal and breath sounds normal. No respiratory distress. He has no wheezes. He has no rales.  Abdominal: Soft. There is no tenderness.  Musculoskeletal: He exhibits no edema and no tenderness.  Lymphadenopathy:    He has no cervical adenopathy.  Neurological:       Decreased sensation in feet  Skin: No rash noted.       No foot ulcers  Psychiatric: He has a normal mood and affect. His behavior is normal. Judgment and thought content normal.            Assessment & Plan:

## 2011-11-22 NOTE — Assessment & Plan Note (Signed)
Using the xanax regularly Asked him to try cutting it in half

## 2012-01-21 ENCOUNTER — Other Ambulatory Visit: Payer: Self-pay | Admitting: *Deleted

## 2012-01-21 MED ORDER — METFORMIN HCL 1000 MG PO TABS: 1000.0000 mg | ORAL_TABLET | Freq: Two times a day (BID) | ORAL | Status: DC

## 2012-01-21 NOTE — Telephone Encounter (Signed)
Received faxed refill request from pharmacy for Metformin Refill sent to pharmacy electronically. 

## 2012-05-12 ENCOUNTER — Ambulatory Visit (INDEPENDENT_AMBULATORY_CARE_PROVIDER_SITE_OTHER): Payer: Medicare Other | Admitting: Internal Medicine

## 2012-05-12 ENCOUNTER — Encounter: Payer: Self-pay | Admitting: Internal Medicine

## 2012-05-12 VITALS — BP 118/72 | HR 77 | Temp 97.6°F | Ht 68.0 in | Wt 189.0 lb

## 2012-05-12 DIAGNOSIS — Z79899 Other long term (current) drug therapy: Secondary | ICD-10-CM

## 2012-05-12 DIAGNOSIS — Z23 Encounter for immunization: Secondary | ICD-10-CM

## 2012-05-12 DIAGNOSIS — E1142 Type 2 diabetes mellitus with diabetic polyneuropathy: Secondary | ICD-10-CM

## 2012-05-12 DIAGNOSIS — I10 Essential (primary) hypertension: Secondary | ICD-10-CM

## 2012-05-12 DIAGNOSIS — K219 Gastro-esophageal reflux disease without esophagitis: Secondary | ICD-10-CM

## 2012-05-12 DIAGNOSIS — E785 Hyperlipidemia, unspecified: Secondary | ICD-10-CM

## 2012-05-12 DIAGNOSIS — F411 Generalized anxiety disorder: Secondary | ICD-10-CM

## 2012-05-12 DIAGNOSIS — E1149 Type 2 diabetes mellitus with other diabetic neurological complication: Secondary | ICD-10-CM

## 2012-05-12 LAB — BASIC METABOLIC PANEL
BUN: 21 mg/dL (ref 6–23)
CO2: 22 mEq/L (ref 19–32)
Calcium: 9.4 mg/dL (ref 8.4–10.5)
Glucose, Bld: 178 mg/dL — ABNORMAL HIGH (ref 70–99)
Sodium: 143 mEq/L (ref 135–145)

## 2012-05-12 LAB — LIPID PANEL
Cholesterol: 179 mg/dL (ref 0–200)
HDL: 40.8 mg/dL (ref >39.00)
Triglycerides: 155 mg/dL — ABNORMAL HIGH (ref 0.0–149.0)

## 2012-05-12 LAB — CBC WITH DIFFERENTIAL/PLATELET
Basophils Absolute: 0 10*3/uL (ref 0.0–0.1)
Eosinophils Absolute: 0.1 10*3/uL (ref 0.0–0.7)
Hemoglobin: 15.4 g/dL (ref 13.0–17.0)
Lymphocytes Relative: 37.5 % (ref 12.0–46.0)
Lymphs Abs: 2.3 10*3/uL (ref 0.7–4.0)
MCHC: 33.3 g/dL (ref 30.0–36.0)
Neutro Abs: 3.1 10*3/uL (ref 1.4–7.7)
RDW: 12.7 % (ref 11.5–14.6)

## 2012-05-12 LAB — HEPATIC FUNCTION PANEL
Albumin: 4.3 g/dL (ref 3.5–5.2)
Bilirubin, Direct: 0 mg/dL (ref 0.0–0.3)
Total Protein: 7.5 g/dL (ref 6.0–8.3)

## 2012-05-12 LAB — HEMOGLOBIN A1C: Hgb A1c MFr Bld: 7.5 % — ABNORMAL HIGH (ref 4.6–6.5)

## 2012-05-12 NOTE — Assessment & Plan Note (Signed)
BP Readings from Last 3 Encounters:  05/12/12 118/72  11/22/11 120/80  05/15/11 128/80    Good control Due for labs

## 2012-05-12 NOTE — Assessment & Plan Note (Signed)
Seems to still have good control Neuropathy in feet still not bad enough for meds

## 2012-05-12 NOTE — Progress Notes (Signed)
Subjective:    Patient ID: Patrick [name], male    DOB: Apr 26, 1936, 76 y.o.   MRN: [id number]  HPI Here for check up Reviewed [employer] recommendations Diabetic education materials given  No new concerns  Checks sugars every AM---occ later in day if he sees some spots 78-140. Mostly under 130 No sig hypoglycemic spells Some feet burning in the night---diabetic lotion helps  Having headaches and dizziness again Relates to fall 10 years ago and had care for a year after Will get dizzy feeling, then headache. Nausea but no vertigo Ice pack helps No stroke like symptoms like weakness, diplopia, speech changes, facial droop Usually in afternoon--he feels the "energy has left my body" Good energy in AM--works outside. Tries to stay hydrated Nap in afternoon  Some acid reflux and needs extra gaviscon No other chest pain No SOB No syncope  Current Outpatient Prescriptions on File Prior to Visit  Medication Sig Dispense Refill  . ACCU-CHEK COMPACT TEST DRUM test strip 1 each as needed.       . ALPRAZolam (XANAX) 1 MG tablet Take 0.5-1 mg by mouth 2 (two) times daily as needed.        Marland Kitchen aspirin 81 MG tablet Take 81 mg by mouth daily.        . fish oil-omega-3 fatty acids 1000 MG capsule Take 2 g by mouth daily.        Marland Kitchen glimepiride (AMARYL) 4 MG tablet Take 4 mg by mouth 2 (two) times daily.        Marland Kitchen latanoprost (XALATAN) 0.005 % ophthalmic solution Place 1 drop into both eyes Daily.      Marland Kitchen lisinopril (PRINIVIL,ZESTRIL) 40 MG tablet Take 40 mg by mouth daily.        . metFORMIN (GLUCOPHAGE) 1000 MG tablet Take 1 tablet (1,000 mg total) by mouth 2 (two) times daily with a meal. ,then take 1/2 tab at bedtime  75 tablet  8  . ranitidine (ZANTAC) 150 MG tablet Take 150 mg by mouth 2 (two) times daily. For acid reflux symptoms       . verapamil (CALAN-SR) 240 MG CR tablet Take 240 mg by mouth at bedtime.         Current Facility-Administered Medications on File Prior to Visit    Medication Dose Route Frequency Provider Last Rate Last Dose  . 0.9 %  sodium chloride infusion  500 mL Intravenous Continuous Meryl Dare, MD,FACG        No Known Allergies  Past Medical History  Diagnosis Date  . Anxiety   . Diabetes mellitus   . GERD (gastroesophageal reflux disease)   . Hypertension   . Peptic ulcer disease   . Rosacea   . Hyperlipidemia   . Renal cyst   . Irregular heartbeat     Dr. (his) stated that it was "nothing to worry about."    Past Surgical History  Procedure Date  . Cholecystectomy   . Partial gastrectomy 1974  . Knee arthroscopy     left    Family History  Problem Relation Age of Onset  . Cancer Brother     prostate  . Colon cancer Brother     History   Social History  . Marital Status: Married    Spouse Name: N/A    Number of Children: 2  . Years of Education: N/A   Occupational History  . RETIRED, electrician    Social History Main Topics  . Smoking status: Never  Smoker   . Smokeless tobacco: Never Used  . Alcohol Use: No  . Drug Use: Not on file  . Sexually Active: Not on file   Other Topics Concern  . Not on file   Social History Narrative  . No narrative on file   Review of Systems Skin ancers removed from left temple and left shoulder---[facility] Sleeps okay Appetite is fine Weight is stable    Objective:   Physical Exam  Constitutional: He appears well-developed and well-nourished. No distress.  Neck: Normal range of motion. Neck supple. No thyromegaly present.       No muscle tension  Cardiovascular: Normal rate, regular rhythm, normal heart sounds and intact distal pulses.  Exam reveals no gallop.   No murmur heard. Pulmonary/Chest: Effort normal and breath sounds normal. No respiratory distress. He has no wheezes. He has no rales.  Abdominal: Soft. There is no tenderness.  Musculoskeletal: He exhibits no edema and no tenderness.  Lymphadenopathy:    He has no cervical adenopathy.   Skin: No rash noted. No erythema.       Mild mycotic changes in toenails  Psychiatric: He has a normal mood and affect. His behavior is normal. Thought content normal.          Assessment & Plan:

## 2012-05-12 NOTE — Assessment & Plan Note (Signed)
Uses the xanax once or twice a day Headaches/dizziness probably from tension headaches/dehydration---discussed

## 2012-05-12 NOTE — Assessment & Plan Note (Signed)
Ongoing regurgitation Okay on the ranitidine and prn gaviscon

## 2012-05-12 NOTE — Assessment & Plan Note (Signed)
Lab Results  Component Value Date   LDLCALC 91 05/15/2011   Due for labs On fish oil

## 2012-05-15 ENCOUNTER — Encounter: Payer: Self-pay | Admitting: *Deleted

## 2012-05-21 ENCOUNTER — Telehealth: Payer: Self-pay

## 2012-05-21 NOTE — Telephone Encounter (Signed)
Pt request 05/12/12 lab results.Patient notified as instructed by telephone and pt aware will receive copy in mail.

## 2012-07-20 ENCOUNTER — Ambulatory Visit (INDEPENDENT_AMBULATORY_CARE_PROVIDER_SITE_OTHER): Payer: Medicare Other

## 2012-07-20 DIAGNOSIS — Z23 Encounter for immunization: Secondary | ICD-10-CM

## 2012-10-14 ENCOUNTER — Other Ambulatory Visit: Payer: Self-pay | Admitting: *Deleted

## 2012-10-14 MED ORDER — METFORMIN HCL 1000 MG PO TABS: 1000.0000 mg | ORAL_TABLET | Freq: Two times a day (BID) | ORAL | Status: DC

## 2012-11-10 ENCOUNTER — Ambulatory Visit (INDEPENDENT_AMBULATORY_CARE_PROVIDER_SITE_OTHER): Payer: Medicare Other | Admitting: Internal Medicine

## 2012-11-10 ENCOUNTER — Encounter: Payer: Self-pay | Admitting: Internal Medicine

## 2012-11-10 VITALS — BP 112/70 | HR 76 | Temp 98.1°F | Ht 68.0 in | Wt 190.0 lb

## 2012-11-10 DIAGNOSIS — E785 Hyperlipidemia, unspecified: Secondary | ICD-10-CM

## 2012-11-10 DIAGNOSIS — K219 Gastro-esophageal reflux disease without esophagitis: Secondary | ICD-10-CM

## 2012-11-10 DIAGNOSIS — E1149 Type 2 diabetes mellitus with other diabetic neurological complication: Secondary | ICD-10-CM

## 2012-11-10 DIAGNOSIS — I10 Essential (primary) hypertension: Secondary | ICD-10-CM

## 2012-11-10 NOTE — Progress Notes (Signed)
Subjective:    Patient ID: Patrick [name], male    DOB: 06/21/1936, 77 y.o.   MRN: [id number]  HPI Here for follow up Has had a "quick pain" under left mid ribs Attributes this to reflux and will take gaviscon with improvement Only every few weeks Usually before supper Hasn't been taking zantac regularly No swallowing  Hasn't been as active with the cold weather Plans to join the Y Some change in stamina--but not major Will occ get brief edema in left foot---did have past football injury No SOB  Checks sugars every AM Most under 130 Only occ in 150's No hypoglycemic reactions--but if he is late for lunch when working outside, he can feel it  Current Outpatient Prescriptions on File Prior to Visit  Medication Sig Dispense Refill  . ACCU-CHEK COMPACT TEST DRUM test strip 1 each as needed.       . ALPRAZolam (XANAX) 1 MG tablet Take 0.5-1 mg by mouth 2 (two) times daily as needed.        Marland Kitchen aspirin 81 MG tablet Take 81 mg by mouth daily.        . fish oil-omega-3 fatty acids 1000 MG capsule Take 2 g by mouth daily.        Marland Kitchen glimepiride (AMARYL) 4 MG tablet Take 4 mg by mouth 2 (two) times daily.        Marland Kitchen latanoprost (XALATAN) 0.005 % ophthalmic solution Place 1 drop into both eyes Daily.      Marland Kitchen lisinopril (PRINIVIL,ZESTRIL) 40 MG tablet Take 40 mg by mouth daily.        . metFORMIN (GLUCOPHAGE) 1000 MG tablet Take 1 tablet (1,000 mg total) by mouth 2 (two) times daily with a meal. ,then take 1/2 tab at bedtime  75 tablet  6  . ranitidine (ZANTAC) 150 MG tablet Take 150 mg by mouth 2 (two) times daily. For acid reflux symptoms       . verapamil (CALAN-SR) 240 MG CR tablet Take 240 mg by mouth at bedtime.         Current Facility-Administered Medications on File Prior to Visit  Medication Dose Route Frequency Provider Last Rate Last Dose  . 0.9 %  sodium chloride infusion  500 mL Intravenous Continuous Meryl Dare, MD,FACG        No Known Allergies  Past Medical History    Diagnosis Date  . Anxiety   . Diabetes mellitus   . GERD (gastroesophageal reflux disease)   . Hypertension   . Peptic ulcer disease   . Rosacea   . Hyperlipidemia   . Renal cyst   . Irregular heartbeat     Dr. (his) stated that it was "nothing to worry about."    Past Surgical History  Procedure Date  . Cholecystectomy   . Partial gastrectomy 1974  . Knee arthroscopy     left    Family History  Problem Relation Age of Onset  . Cancer Brother     prostate  . Colon cancer Brother     History   Social History  . Marital Status: Married    Spouse Name: N/A    Number of Children: 2  . Years of Education: N/A   Occupational History  . RETIRED, electrician    Social History Main Topics  . Smoking status: Never Smoker   . Smokeless tobacco: Never Used  . Alcohol Use: No  . Drug Use: Not on file  . Sexually Active: Not on file  Other Topics Concern  . Not on file   Social History Narrative  . No narrative on file   Review of Systems Sleeps okay Appetite is fine Weight is up 2#     Objective:   Physical Exam  Constitutional: He appears well-developed and well-nourished. No distress.  Neck: Normal range of motion. Neck supple. No thyromegaly present.  Cardiovascular: Normal rate, regular rhythm, normal heart sounds and intact distal pulses.  Exam reveals no gallop.   No murmur heard. Pulmonary/Chest: Effort normal and breath sounds normal. No respiratory distress. He has no wheezes. He has no rales.  Abdominal: Soft. He exhibits no distension. There is no tenderness. There is no guarding.  Musculoskeletal: He exhibits no edema and no tenderness.  Lymphadenopathy:    He has no cervical adenopathy.  Skin: No rash noted.       No foot lesions  Psychiatric: He has a normal mood and affect. His behavior is normal.          Assessment & Plan:

## 2012-11-10 NOTE — Assessment & Plan Note (Signed)
Lab Results  Component Value Date   HGBA1C 7.5* 05/12/2012   Still seems to have good control Upcoming eye exam Check A1c again

## 2012-11-10 NOTE — Assessment & Plan Note (Signed)
Lab Results  Component Value Date   LDLCALC 107* 05/12/2012   Close to goal on fish oil Will recheck next time

## 2012-11-10 NOTE — Patient Instructions (Signed)
Please start taking the ranitidine regularly---at least once a day---to control the heartburn

## 2012-11-10 NOTE — Assessment & Plan Note (Signed)
Seems to be having intermittent symptoms Advised regular ranitidine

## 2012-11-10 NOTE — Assessment & Plan Note (Signed)
BP Readings from Last 3 Encounters:  11/10/12 112/70  05/12/12 118/72  11/22/11 120/80   Good control still No changes

## 2012-11-11 ENCOUNTER — Encounter: Payer: Self-pay | Admitting: *Deleted

## 2013-04-01 ENCOUNTER — Telehealth: Payer: Self-pay | Admitting: Internal Medicine

## 2013-04-01 ENCOUNTER — Ambulatory Visit (INDEPENDENT_AMBULATORY_CARE_PROVIDER_SITE_OTHER): Payer: Medicare Other | Admitting: Internal Medicine

## 2013-04-01 ENCOUNTER — Encounter: Payer: Self-pay | Admitting: Internal Medicine

## 2013-04-01 VITALS — BP 138/80 | HR 91 | Temp 98.4°F | Wt 192.0 lb

## 2013-04-01 DIAGNOSIS — R0789 Other chest pain: Secondary | ICD-10-CM | POA: Insufficient documentation

## 2013-04-01 NOTE — Telephone Encounter (Signed)
Okay to wait for 1:45 unless he worsens

## 2013-04-01 NOTE — Assessment & Plan Note (Signed)
Doesn't seem to be ischemic Did start after he went off the ranitidine Discussed going back on it Will review at his follow up  Consider change to PPI If persists, set up stress test

## 2013-04-01 NOTE — Telephone Encounter (Signed)
RN had call back in queue to follow up with pt prior to app.  Unable to contact pt d/t busy signal all day.  Callback # was one number off.  Pt was at office, called pt per since it was in queue.  No sx to report.  Instructed caller to callback as needed and to f/u as directed.

## 2013-04-01 NOTE — Telephone Encounter (Signed)
Patient Information:  Caller Name: Marian  Phone: [phone]  Patient: Gailen, Venne  Gender: Male  DOB: 03-15-1936  Age: 77 Years  PCP: Tillman Abide [facility])  Office Follow Up:  Does the office need to follow up with this patient?: N/A  Instructions For The Office: N/A  RN Note:  Caller wants to come to office and if provider wants him to go to the ED then he will go to the ED.  Pt has an appt 04/01/13 @ 1345 with Tommi Rumps. [place].  Symptoms  Reason For Call & Symptoms: Occasional sharp pain under left rib cage for the past 3-4 days.  Pain comes and goes.  Doesn't last long.  When asked about arm pain caller mentioned left arm has a faint odd feeling at times.  No breathing difficulties.  Caller refused ED and wants an appt.  Caller thinks it maybe acid reflux and wants Dr. Alphonsus Sias to evaluate him.     Reviewed Health History In EMR: N/A  Reviewed Medications In EMR: N/A  Reviewed Allergies In EMR: N/A  Reviewed Surgeries / Procedures: N/A  Date of Onset of Symptoms: 03/29/2013  Treatments Tried: OTC acid reflux  Treatments Tried Worked: No  Guideline(s) Used:  Chest Pain  Disposition Per Guideline:   Go to ED Now  Reason For Disposition Reached:   Severe chest pain  Advice Given:  Call Back If:  Severe chest pain  Constant chest pain lasting longer than 5 minutes  Difficulty breathing  Fever  You become worse.  Patient Will Follow Care Advice:  YES

## 2013-04-01 NOTE — Patient Instructions (Signed)
Restart the ranitidine twice a day and DO NOT STOP IT!! If you get worse pain, or you are short of breath at the same time, call 911. If the pain persists despite the ranitidine---call me to let me know

## 2013-04-01 NOTE — Progress Notes (Signed)
Subjective:    Patient ID: Patrick [name], male    DOB: 26-Feb-1936, 77 y.o.   MRN: [id number]  HPI Has had increased chest pain for about 3 days Sharp, brief pains on the left side---tried gaviscon Then awoken this AM at 4---had pain on right side Gaviscon didn't help Still with some right chest pain No SOB No nausea or diaphoresis  Only occasional indigestion---gets with heavy lifting  Not related to eating He really thinks this is acid  Used the zantac regularly for short time---stopped about a month ago Didn't get more Hasn't worked out at [employer] in the past couple of weeks Able to push mow the lawn---stamina seems stable over the past 6-12 months at least  Current Outpatient Prescriptions on File Prior to Visit  Medication Sig Dispense Refill  . ACCU-CHEK COMPACT TEST DRUM test strip 1 each as needed.       . ALPRAZolam (XANAX) 1 MG tablet Take 0.5-1 mg by mouth 2 (two) times daily as needed.        Marland Kitchen aspirin 81 MG tablet Take 81 mg by mouth daily.        . fish oil-omega-3 fatty acids 1000 MG capsule Take 2 g by mouth daily.        Marland Kitchen glimepiride (AMARYL) 4 MG tablet Take 4 mg by mouth 2 (two) times daily.        Marland Kitchen latanoprost (XALATAN) 0.005 % ophthalmic solution Place 1 drop into both eyes Daily.      Marland Kitchen lisinopril (PRINIVIL,ZESTRIL) 40 MG tablet Take 40 mg by mouth daily.        . metFORMIN (GLUCOPHAGE) 1000 MG tablet Take 1 tablet (1,000 mg total) by mouth 2 (two) times daily with a meal. ,then take 1/2 tab at bedtime  75 tablet  6  . ranitidine (ZANTAC) 150 MG tablet Take 150 mg by mouth 2 (two) times daily. For acid reflux symptoms       . verapamil (CALAN-SR) 240 MG CR tablet Take 240 mg by mouth at bedtime.         No current facility-administered medications on file prior to visit.    No Known Allergies  Past Medical History  Diagnosis Date  . Anxiety   . Diabetes mellitus   . GERD (gastroesophageal reflux disease)   . Hypertension   . Peptic ulcer disease    . Rosacea   . Hyperlipidemia   . Renal cyst   . Irregular heartbeat     Dr. (his) stated that it was "nothing to worry about."    Past Surgical History  Procedure Laterality Date  . Cholecystectomy    . Partial gastrectomy  1974  . Knee arthroscopy      left    Family History  Problem Relation Age of Onset  . Cancer Brother     prostate  . Colon cancer Brother     History   Social History  . Marital Status: Married    Spouse Name: N/A    Number of Children: 2  . Years of Education: N/A   Occupational History  . RETIRED, electrician    Social History Main Topics  . Smoking status: Never Smoker   . Smokeless tobacco: Never Used  . Alcohol Use: No  . Drug Use: Not on file  . Sexually Active: Not on file   Other Topics Concern  . Not on file   Social History Narrative  . No narrative on file   Review  of Systems Appetite is fine Weight stable Knot on left ankle---did note this was obscurred after being on his feet a while (some edema) Sleeps okay-- uses 3 pillows to prevent acid reflux. No PND    Objective:   Physical Exam  Constitutional: He appears well-developed and well-nourished. No distress.  Neck: Normal range of motion. Neck supple. No thyromegaly present.  Cardiovascular: Normal rate, regular rhythm, normal heart sounds and intact distal pulses.  Exam reveals no gallop.   No murmur heard. Pulmonary/Chest: Effort normal and breath sounds normal. No respiratory distress. He has no wheezes. He has no rales.  Abdominal: Soft. There is no tenderness.  Musculoskeletal: He exhibits no edema and no tenderness.  Lymphadenopathy:    He has no cervical adenopathy.  Psychiatric: He has a normal mood and affect. His behavior is normal.          Assessment & Plan:

## 2013-04-02 NOTE — Telephone Encounter (Signed)
Patient was seen yesterday.

## 2013-05-11 ENCOUNTER — Other Ambulatory Visit: Payer: Self-pay | Admitting: Internal Medicine

## 2013-06-09 ENCOUNTER — Encounter: Payer: Self-pay | Admitting: Internal Medicine

## 2013-06-09 ENCOUNTER — Ambulatory Visit (INDEPENDENT_AMBULATORY_CARE_PROVIDER_SITE_OTHER): Payer: Medicare Other | Admitting: Internal Medicine

## 2013-06-09 VITALS — BP 120/80 | HR 68 | Temp 97.5°F | Wt 193.0 lb

## 2013-06-09 DIAGNOSIS — E1149 Type 2 diabetes mellitus with other diabetic neurological complication: Secondary | ICD-10-CM

## 2013-06-09 DIAGNOSIS — Z0000 Encounter for general adult medical examination without abnormal findings: Secondary | ICD-10-CM | POA: Insufficient documentation

## 2013-06-09 DIAGNOSIS — K219 Gastro-esophageal reflux disease without esophagitis: Secondary | ICD-10-CM

## 2013-06-09 DIAGNOSIS — I10 Essential (primary) hypertension: Secondary | ICD-10-CM

## 2013-06-09 DIAGNOSIS — E785 Hyperlipidemia, unspecified: Secondary | ICD-10-CM

## 2013-06-09 DIAGNOSIS — F411 Generalized anxiety disorder: Secondary | ICD-10-CM

## 2013-06-09 LAB — HEPATIC FUNCTION PANEL
Alkaline Phosphatase: 42 U/L (ref 39–117)
Bilirubin, Direct: 0 mg/dL (ref 0.0–0.3)
Total Bilirubin: 0.7 mg/dL (ref 0.3–1.2)
Total Protein: 7.4 g/dL (ref 6.0–8.3)

## 2013-06-09 LAB — BASIC METABOLIC PANEL
BUN: 21 mg/dL (ref 6–23)
CO2: 27 mEq/L (ref 19–32)
Calcium: 9.3 mg/dL (ref 8.4–10.5)
Creatinine, Ser: 1 mg/dL (ref 0.4–1.5)
GFR: 81.2 mL/min (ref >60.00)
Glucose, Bld: 172 mg/dL — ABNORMAL HIGH (ref 70–99)
Sodium: 137 mEq/L (ref 135–145)

## 2013-06-09 LAB — CBC WITH DIFFERENTIAL/PLATELET
Basophils Absolute: 0 10*3/uL (ref 0.0–0.1)
Eosinophils Absolute: 0.2 10*3/uL (ref 0.0–0.7)
Hemoglobin: 15.1 g/dL (ref 13.0–17.0)
Lymphocytes Relative: 36.4 % (ref 12.0–46.0)
MCHC: 34 g/dL (ref 30.0–36.0)
Monocytes Relative: 7.1 % (ref 3.0–12.0)
Neutro Abs: 3.4 10*3/uL (ref 1.4–7.7)
Neutrophils Relative %: 53.8 % (ref 43.0–77.0)
Platelets: 226 10*3/uL (ref 150.0–400.0)
RDW: 12.9 % (ref 11.5–14.6)

## 2013-06-09 LAB — LIPID PANEL
HDL: 35.6 mg/dL — ABNORMAL LOW (ref >39.00)
Triglycerides: 151 mg/dL — ABNORMAL HIGH (ref 0.0–149.0)
VLDL: 30.2 mg/dL (ref 0.0–40.0)

## 2013-06-09 LAB — HEMOGLOBIN A1C: Hgb A1c MFr Bld: 7.4 % — ABNORMAL HIGH (ref 4.6–6.5)

## 2013-06-09 LAB — TSH: TSH: 1.73 u[IU]/mL (ref 0.35–5.50)

## 2013-06-09 LAB — HM DIABETES FOOT EXAM

## 2013-06-09 NOTE — Assessment & Plan Note (Signed)
Doing well UTD on colon and imms

## 2013-06-09 NOTE — Assessment & Plan Note (Signed)
Seems to have good control still Due for labs Mild neuropathy---not enough for meds

## 2013-06-09 NOTE — Assessment & Plan Note (Signed)
Uses alprazolam regularly

## 2013-06-09 NOTE — Assessment & Plan Note (Signed)
BP Readings from Last 3 Encounters:  06/09/13 120/80  04/01/13 138/80  11/10/12 112/70   Good control No changes needed

## 2013-06-09 NOTE — Assessment & Plan Note (Signed)
Better with regular rantidine

## 2013-06-09 NOTE — Assessment & Plan Note (Signed)
Discussed statins for control He really doesn't want it--so if still close to 100, will hold off

## 2013-06-09 NOTE — Progress Notes (Signed)
Subjective:    Patient ID: Patrick [name], male    DOB: [birth year], 77 y.o.   MRN: [id number]  HPI Here for physical Doing well--- not having the chest pain anymore while on ranitidine bid Does notice some easy fatigue (like cutting lawn)--but just in the past few weeks  Now dating former high school classmate This is going well  Checks sugars every morning 110-140 usually Has slacked off on exercise--plans to get back to the [facility]  Discussed renal cyst Goes back 10 years Stable 2008-2010. Simple cyst---no further imaging unless pain  Current Outpatient Prescriptions on File Prior to Visit  Medication Sig Dispense Refill  . ACCU-CHEK COMPACT TEST DRUM test strip 1 each as needed.       . ALPRAZolam (XANAX) 1 MG tablet Take 0.5-1 mg by mouth 2 (two) times daily as needed.        Marland Kitchen aspirin 81 MG tablet Take 81 mg by mouth daily.        . fish oil-omega-3 fatty acids 1000 MG capsule Take 2 g by mouth daily.        Marland Kitchen glimepiride (AMARYL) 4 MG tablet Take 4 mg by mouth 2 (two) times daily.        Marland Kitchen latanoprost (XALATAN) 0.005 % ophthalmic solution Place 1 drop into both eyes Daily.      Marland Kitchen lisinopril (PRINIVIL,ZESTRIL) 40 MG tablet Take 40 mg by mouth daily.        . metFORMIN (GLUCOPHAGE) 1000 MG tablet TAKE 1 TABLET BY MOUTH BEFORE BREAKFAST AND DINNER AND 1/2 TABLET AT BEDTIME FOR DIABETES  75 tablet  11  . ranitidine (ZANTAC) 150 MG tablet Take 150 mg by mouth 2 (two) times daily. For acid reflux symptoms       . verapamil (CALAN-SR) 240 MG CR tablet Take 240 mg by mouth at bedtime.         No current facility-administered medications on file prior to visit.    No Known Allergies  Past Medical History  Diagnosis Date  . Anxiety   . Diabetes mellitus   . GERD (gastroesophageal reflux disease)   . Hypertension   . Peptic ulcer disease   . Rosacea   . Hyperlipidemia   . Renal cyst   . Irregular heartbeat     Dr. (his) stated that it was "nothing to worry about."    Past  Surgical History  Procedure Laterality Date  . Cholecystectomy    . Partial gastrectomy  1974  . Knee arthroscopy      left    Family History  Problem Relation Age of Onset  . Cancer Brother     prostate  . Colon cancer Brother     History   Social History  . Marital Status: Married    Spouse Name: N/A    Number of Children: 2  . Years of Education: N/A   Occupational History  . RETIRED, electrician    Social History Main Topics  . Smoking status: Never Smoker   . Smokeless tobacco: Never Used  . Alcohol Use: No  . Drug Use: Not on file  . Sexual Activity: Not on file   Other Topics Concern  . Not on file   Social History Narrative   No living will   Asks that son make health care decisions.   Would accept resuscitation attempts   No tube feeds if cognitively unaware   Review of Systems  Constitutional: Negative for fatigue and unexpected weight change.  Wears seat belt  HENT: Negative for hearing loss, congestion, rhinorrhea, dental problem and tinnitus.        Regular with dentist  Eyes: Negative for visual disturbance.       Rx for glaucoma  Respiratory: Negative for cough, chest tightness and shortness of breath.   Cardiovascular: Positive for leg swelling. Negative for chest pain and palpitations.       Mild edema on left if on his feet for a long time--previous fracture  Gastrointestinal: Negative for nausea, vomiting, abdominal pain, constipation and blood in stool.  Endocrine: Negative for cold intolerance and heat intolerance.  Genitourinary: Negative for urgency, frequency and difficulty urinating.       No sexual problems  Musculoskeletal: Negative for back pain, joint swelling and arthralgias.  Skin: Negative for rash.       Had some actinics treated recently  Allergic/Immunologic: Positive for environmental allergies. Negative for immunocompromised state.       Mild grass sensitivity  Neurological: Positive for tremors, numbness and  headaches. Negative for dizziness, syncope, weakness and light-headedness.       Sinus headache when out mowing lawn Stable numbness in feet. Tingling and burning--mild  Hematological: Negative for adenopathy. Bruises/bleeds easily.  Psychiatric/Behavioral: Negative for sleep disturbance and dysphoric mood. The patient is nervous/anxious.        Mild anxiety/shakes --- at times        Objective:   Physical Exam  Constitutional: He is oriented to person, place, and time. He appears well-developed and well-nourished. No distress.  HENT:  Head: Normocephalic and atraumatic.  Right Ear: External ear normal.  Left Ear: External ear normal.  Mouth/Throat: Oropharynx is clear and moist. No oropharyngeal exudate.  Eyes: Conjunctivae and EOM are normal. Pupils are equal, round, and reactive to light.  Neck: Normal range of motion. Neck supple. No thyromegaly present.  Cardiovascular: Normal rate, regular rhythm, normal heart sounds and intact distal pulses.  Exam reveals no gallop.   No murmur heard. Pulmonary/Chest: Effort normal and breath sounds normal. No respiratory distress. He has no wheezes. He has no rales.  Abdominal: Soft. There is no tenderness.  Musculoskeletal: He exhibits no edema and no tenderness.  Lymphadenopathy:    He has no cervical adenopathy.  Neurological: He is alert and oriented to person, place, and time.  Skin: No rash noted. No erythema.  No foot lesions  Psychiatric: He has a normal mood and affect. His behavior is normal.          Assessment & Plan:

## 2013-06-14 ENCOUNTER — Encounter: Payer: Self-pay | Admitting: *Deleted

## 2013-07-14 ENCOUNTER — Ambulatory Visit (INDEPENDENT_AMBULATORY_CARE_PROVIDER_SITE_OTHER): Payer: Medicare Other

## 2013-07-14 DIAGNOSIS — Z23 Encounter for immunization: Secondary | ICD-10-CM

## 2013-08-30 LAB — HM DIABETES EYE EXAM

## 2013-09-30 LAB — LIPID PANEL
Cholesterol: 164 mg/dL (ref 0–200)
LDL Cholesterol: 97 mg/dL

## 2013-09-30 LAB — HEMOGLOBIN A1C: HEMOGLOBIN A1C: 7.6 % — AB (ref 4.0–6.0)

## 2013-11-05 ENCOUNTER — Telehealth: Payer: Self-pay | Admitting: *Deleted

## 2013-11-05 NOTE — Telephone Encounter (Signed)
Form done and signed

## 2013-11-05 NOTE — Telephone Encounter (Signed)
Received prior auth request for metformin. Max quantity covered by insurance is 2 tablets per day, unless priro auth quantity limit override is requested. Prior auth paperwork obtained and placed in your inbox.

## 2013-11-09 NOTE — Telephone Encounter (Signed)
Prior auth no longer required per insurance. Auth paperwork to be scanned in pt's medical record.

## 2013-12-10 ENCOUNTER — Encounter: Payer: Self-pay | Admitting: Internal Medicine

## 2013-12-10 ENCOUNTER — Ambulatory Visit (INDEPENDENT_AMBULATORY_CARE_PROVIDER_SITE_OTHER): Payer: Medicare HMO | Admitting: Internal Medicine

## 2013-12-10 VITALS — BP 130/80 | HR 84 | Temp 98.0°F | Wt 193.0 lb

## 2013-12-10 DIAGNOSIS — F411 Generalized anxiety disorder: Secondary | ICD-10-CM

## 2013-12-10 DIAGNOSIS — E785 Hyperlipidemia, unspecified: Secondary | ICD-10-CM

## 2013-12-10 DIAGNOSIS — I10 Essential (primary) hypertension: Secondary | ICD-10-CM

## 2013-12-10 DIAGNOSIS — E1149 Type 2 diabetes mellitus with other diabetic neurological complication: Secondary | ICD-10-CM

## 2013-12-10 DIAGNOSIS — E1142 Type 2 diabetes mellitus with diabetic polyneuropathy: Secondary | ICD-10-CM

## 2013-12-10 NOTE — Progress Notes (Signed)
Subjective:    Patient ID: Patrick [name], male    DOB: 07/27/1936, 78 y.o.   MRN: [id number]  HPI Doing okay  Got bad bug around Christmas Got seen by Dr Hardin Negus and got meds for diarrhea Had blood work done then  Sugars did run up when he was sick Up in 140's and once 178 Now back down a little A1c was 7.6% Hasn't been walking as much due to weather Still with burning in feet-- tries to put up with it. Uses freeze rub at night Prefers no med for this  Discussed cholesterol again He really prefers to avoid statin--knows a lot of folks who had side effects  No chest pain No SOB No dizziness or syncope  Uses zantac regularly Acid is controlled with this  Current Outpatient Prescriptions on File Prior to Visit  Medication Sig Dispense Refill  . ACCU-CHEK COMPACT TEST DRUM test strip 1 each as needed.       . ALPRAZolam (XANAX) 1 MG tablet Take 0.5-1 mg by mouth 2 (two) times daily as needed.        Marland Kitchen aspirin 81 MG tablet Take 81 mg by mouth daily.        . fish oil-omega-3 fatty acids 1000 MG capsule Take 2 g by mouth daily.        Marland Kitchen glimepiride (AMARYL) 4 MG tablet Take 4 mg by mouth 2 (two) times daily.        Marland Kitchen latanoprost (XALATAN) 0.005 % ophthalmic solution Place 1 drop into both eyes Daily.      Marland Kitchen lisinopril (PRINIVIL,ZESTRIL) 40 MG tablet Take 40 mg by mouth daily.        . metFORMIN (GLUCOPHAGE) 1000 MG tablet TAKE 1 TABLET BY MOUTH BEFORE BREAKFAST AND DINNER AND 1/2 TABLET AT BEDTIME FOR DIABETES  75 tablet  11  . ranitidine (ZANTAC) 150 MG tablet Take 150 mg by mouth 2 (two) times daily. For acid reflux symptoms       . verapamil (CALAN-SR) 240 MG CR tablet Take 240 mg by mouth at bedtime.         No current facility-administered medications on file prior to visit.    No Known Allergies  Past Medical History  Diagnosis Date  . Anxiety   . Diabetes mellitus   . GERD (gastroesophageal reflux disease)   . Hypertension   . Peptic ulcer disease   .  Rosacea   . Hyperlipidemia   . Renal cyst   . Irregular heartbeat     Dr. (his) stated that it was "nothing to worry about."    Past Surgical History  Procedure Laterality Date  . Cholecystectomy    . Partial gastrectomy  1974  . Knee arthroscopy      left    Family History  Problem Relation Age of Onset  . Cancer Brother     prostate  . Colon cancer Brother     History   Social History  . Marital Status: Married    Spouse Name: N/A    Number of Children: 2  . Years of Education: N/A   Occupational History  . RETIRED, electrician    Social History Main Topics  . Smoking status: Never Smoker   . Smokeless tobacco: Never Used  . Alcohol Use: No  . Drug Use: Not on file  . Sexual Activity: Not on file   Other Topics Concern  . Not on file   Social History Narrative   No  living will   Asks that son make health care decisions.   Would accept resuscitation attempts   No tube feeds if cognitively unaware   Review of Systems Weight is stable Bowels otherwise okay    Objective:   Physical Exam  Constitutional: He appears well-developed and well-nourished. No distress.  Neck: Normal range of motion. Neck supple. No thyromegaly present.  Cardiovascular: Normal rate, regular rhythm, normal heart sounds and intact distal pulses.  Exam reveals no gallop.   No murmur heard. Pulmonary/Chest: Effort normal and breath sounds normal. No respiratory distress. He has no wheezes. He has no rales.  Musculoskeletal: He exhibits no edema and no tenderness.  Lymphadenopathy:    He has no cervical adenopathy.  Skin:  No foot lesions  Psychiatric: He has a normal mood and affect. His behavior is normal.          Assessment & Plan:

## 2013-12-10 NOTE — Assessment & Plan Note (Signed)
BP Readings from Last 3 Encounters:  12/10/13 130/80  06/09/13 120/80  04/01/13 138/80   Good control

## 2013-12-10 NOTE — Assessment & Plan Note (Signed)
Good control  No changes needed

## 2013-12-10 NOTE — Assessment & Plan Note (Signed)
Rarely uses alprazolam

## 2013-12-10 NOTE — Assessment & Plan Note (Signed)
Lab Results  Component Value Date   LDLCALC 97 09/30/2013   Lower at last check Prefers no statin

## 2013-12-10 NOTE — Assessment & Plan Note (Signed)
Mild burning Prefers no meds

## 2013-12-13 ENCOUNTER — Telehealth: Payer: Self-pay | Admitting: Internal Medicine

## 2013-12-13 NOTE — Telephone Encounter (Signed)
Relevant patient education mailed to patient.  

## 2014-03-29 ENCOUNTER — Telehealth: Payer: Self-pay | Admitting: Internal Medicine

## 2014-03-29 NOTE — Telephone Encounter (Signed)
What is he asking He checked the box for being over 74 and doesn't need a doctor's note for this  Is he claiming medical disability? If so, what is the reason I need to be supporting?

## 2014-03-29 NOTE — Telephone Encounter (Signed)
Spoke with patient and he's a diabetic and has hypertension and can't sit for jury duty. Per pt

## 2014-03-29 NOTE — Telephone Encounter (Signed)
Pt dropped off jury summons form to be filled out Form on desk

## 2014-03-29 NOTE — Telephone Encounter (Signed)
.  left message to have patient return my call.  

## 2014-03-30 NOTE — Telephone Encounter (Signed)
That kind of vague report will not get him excused. Is there some particular reason he can't sit in a jury box for several hours?

## 2014-03-30 NOTE — Telephone Encounter (Signed)
Pt wants to take a chance with the note stating he has diabetes and hypertension, he doesn't want to sit for jury duty because he needs to eat whenever.

## 2014-03-30 NOTE — Telephone Encounter (Signed)
Pt returned your call.  

## 2014-03-30 NOTE — Telephone Encounter (Signed)
.  left message to have patient return my call.  

## 2014-03-31 NOTE — Telephone Encounter (Signed)
Noted  I don't think a letter stating DM and HTN would have made any more difference. He does get 1 deferral due to age---can reeval if he gets called again to serve

## 2014-03-31 NOTE — Telephone Encounter (Signed)
Patient came by upset this morning asking for his letter back, I explained that he would be excused from this session of jury duty but he may still be called again in the future.

## 2014-05-04 ENCOUNTER — Other Ambulatory Visit: Payer: Self-pay | Admitting: Internal Medicine

## 2014-06-06 ENCOUNTER — Other Ambulatory Visit: Payer: Self-pay | Admitting: Internal Medicine

## 2014-06-29 ENCOUNTER — Encounter: Payer: Self-pay | Admitting: Internal Medicine

## 2014-06-29 ENCOUNTER — Ambulatory Visit (INDEPENDENT_AMBULATORY_CARE_PROVIDER_SITE_OTHER): Payer: Commercial Managed Care - HMO | Admitting: Internal Medicine

## 2014-06-29 VITALS — BP 116/74 | HR 84 | Temp 97.8°F | Wt 188.0 lb

## 2014-06-29 DIAGNOSIS — E119 Type 2 diabetes mellitus without complications: Secondary | ICD-10-CM

## 2014-06-29 DIAGNOSIS — E1149 Type 2 diabetes mellitus with other diabetic neurological complication: Secondary | ICD-10-CM

## 2014-06-29 LAB — CBC
HCT: 44.7 % (ref 39.0–52.0)
Hemoglobin: 15 g/dL (ref 13.0–17.0)
MCHC: 33.6 g/dL (ref 30.0–36.0)
MCV: 94 fl (ref 78.0–100.0)
Platelets: 276 10*3/uL (ref 150.0–400.0)
RBC: 4.75 Mil/uL (ref 4.22–5.81)
RDW: 12.9 % (ref 11.5–15.5)
WBC: 9.6 10*3/uL (ref 4.0–10.5)

## 2014-06-29 LAB — HEMOGLOBIN A1C: Hgb A1c MFr Bld: 7.5 % — ABNORMAL HIGH (ref 4.6–6.5)

## 2014-06-29 LAB — GLUCOSE, POCT (MANUAL RESULT ENTRY): POC GLUCOSE: 167 mg/dL — AB (ref 70–99)

## 2014-06-29 NOTE — Progress Notes (Signed)
Pre visit review using our clinic review tool, if applicable. No additional management support is needed unless otherwise documented below in the visit note. 

## 2014-06-29 NOTE — Assessment & Plan Note (Signed)
Will check CBC and A1C today May need to adjust medications Flu shot today Foot exam today

## 2014-06-29 NOTE — Progress Notes (Signed)
Subjective:    Patient ID: Patrick [name], male    DOB: Jul 10, 1936, 78 y.o.   MRN: [id number]  HPI  Pt presents to the clinic today with c/o dizziness. He reports this started 1 week ago. He has noticed some "floaters" in his visual fields. He reports that his last diabetic eye exam was 08/2013. He reports the dizziness and floaters are chronic for him and he is not really concerned about this. He is concerned about his blood sugars. He reports his fasting blood sugars have been as high as 184.  He noticed that they have been higher since 8/20. He is on Metformin and Amaryl and has been taking that as directed. He denies any change in his diet or activity level.  His last A1C was 7.6 09/2013.   Review of Systems      Past Medical History  Diagnosis Date  . Anxiety   . Diabetes mellitus   . GERD (gastroesophageal reflux disease)   . Hypertension   . Peptic ulcer disease   . Rosacea   . Hyperlipidemia   . Renal cyst   . Irregular heartbeat     Dr. (his) stated that it was "nothing to worry about."    Current Outpatient Prescriptions  Medication Sig Dispense Refill  . ACCU-CHEK COMPACT TEST DRUM test strip 1 each as needed.       . ALPRAZolam (XANAX) 1 MG tablet Take 0.5-1 mg by mouth 2 (two) times daily as needed.        Marland Kitchen aspirin 81 MG tablet Take 81 mg by mouth daily.        . fish oil-omega-3 fatty acids 1000 MG capsule Take 2 g by mouth daily.        Marland Kitchen glimepiride (AMARYL) 4 MG tablet Take 4 mg by mouth 2 (two) times daily.        Marland Kitchen latanoprost (XALATAN) 0.005 % ophthalmic solution Place 1 drop into both eyes Daily.      Marland Kitchen lisinopril (PRINIVIL,ZESTRIL) 40 MG tablet Take 40 mg by mouth daily.        . metFORMIN (GLUCOPHAGE) 1000 MG tablet TAKE 1 TABLET BY MOUTH BEFORE BREAKFAST AND DINNER, AND 1/2 TABLET ATBEDTIME FOR DIABETES  75 tablet  0  . ranitidine (ZANTAC) 150 MG tablet Take 150 mg by mouth 2 (two) times daily. For acid reflux symptoms       . verapamil (CALAN-SR)  240 MG CR tablet Take 240 mg by mouth at bedtime.         No current facility-administered medications for this visit.    No Known Allergies  Family History  Problem Relation Age of Onset  . Cancer Brother     prostate  . Colon cancer Brother     History   Social History  . Marital Status: Married    Spouse Name: N/A    Number of Children: 2  . Years of Education: N/A   Occupational History  . RETIRED, electrician    Social History Main Topics  . Smoking status: Never Smoker   . Smokeless tobacco: Never Used  . Alcohol Use: No  . Drug Use: Not on file  . Sexual Activity: Not on file   Other Topics Concern  . Not on file   Social History Narrative   No living will   Asks that son make health care decisions.   Would accept resuscitation attempts   No tube feeds if cognitively unaware  Constitutional: Denies fever, malaise, fatigue, headache or abrupt weight changes.  HEENT: Pt reports vision changes. Denies eye pain, eye redness, ear pain, ringing in the ears, wax buildup, runny nose, nasal congestion, bloody nose, or sore throat. Respiratory: Denies difficulty breathing, shortness of breath, cough or sputum production.   Cardiovascular: Denies chest pain, chest tightness, palpitations or swelling in the hands or feet.  Gastrointestinal: Denies abdominal pain, bloating, constipation, diarrhea or blood in the stool.  Neurological: Pt reports dizziness. Denies difficulty with memory, difficulty with speech or problems with balance and coordination.   No other specific complaints in a complete review of systems (except as listed in HPI above).  Objective:   Physical Exam   BP 116/74  Pulse 84  Temp(Src) 97.8 F (36.6 C) (Oral)  Wt 188 lb (85.276 kg)  SpO2 98% Wt Readings from Last 3 Encounters:  06/29/14 188 lb (85.276 kg)  12/10/13 193 lb (87.544 kg)  06/09/13 193 lb (87.544 kg)    General: Appears his stated age, well developed, well nourished in  NAD. HEENT: Head: normal shape and size; Eyes: sclera slightly injected, no icterus, conjunctiva pink, PERRLA and EOMs intact; Cardiovascular: Normal rate and rhythm. S1,S2 noted.  No murmur, rubs or gallops noted. Pulmonary/Chest: Normal effort and positive vesicular breath sounds. No respiratory distress. No wheezes, rales or ronchi noted.    BMET    Component Value Date/Time   NA 137 06/09/2013 0843   K 4.7 06/09/2013 0843   CL 104 06/09/2013 0843   CO2 27 06/09/2013 0843   GLUCOSE 172* 06/09/2013 0843   BUN 21 06/09/2013 0843   CREATININE 1.0 06/09/2013 0843   CALCIUM 9.3 06/09/2013 0843   GFRNONAA 78.99 07/30/2010 1042   GFRAA 107 12/20/2008 0000    Lipid Panel     Component Value Date/Time   CHOL 164 09/30/2013   TRIG 151.0* 06/09/2013 0843   HDL 35.60* 06/09/2013 0843   CHOLHDL 5 06/09/2013 0843   VLDL 30.2 06/09/2013 0843   LDLCALC 97 09/30/2013    CBC    Component Value Date/Time   WBC 6.3 06/09/2013 0843   RBC 4.76 06/09/2013 0843   HGB 15.1 06/09/2013 0843   HCT 44.6 06/09/2013 0843   PLT 226.0 06/09/2013 0843   MCV 93.7 06/09/2013 0843   MCHC 34.0 06/09/2013 0843   RDW 12.9 06/09/2013 0843   LYMPHSABS 2.3 06/09/2013 0843   MONOABS 0.4 06/09/2013 0843   EOSABS 0.2 06/09/2013 0843   BASOSABS 0.0 06/09/2013 0843    Hgb A1C Lab Results  Component Value Date   HGBA1C 7.6* 09/30/2013        Assessment & Plan:

## 2014-06-29 NOTE — Patient Instructions (Addendum)

## 2014-07-01 ENCOUNTER — Encounter: Payer: Medicare HMO | Admitting: Internal Medicine

## 2014-07-06 ENCOUNTER — Other Ambulatory Visit: Payer: Self-pay | Admitting: Internal Medicine

## 2014-07-08 ENCOUNTER — Ambulatory Visit (INDEPENDENT_AMBULATORY_CARE_PROVIDER_SITE_OTHER): Payer: Medicare HMO | Admitting: Internal Medicine

## 2014-07-08 ENCOUNTER — Encounter: Payer: Self-pay | Admitting: Internal Medicine

## 2014-07-08 VITALS — BP 120/80 | HR 64 | Temp 97.9°F | Ht 66.5 in | Wt 189.0 lb

## 2014-07-08 DIAGNOSIS — Z23 Encounter for immunization: Secondary | ICD-10-CM

## 2014-07-08 DIAGNOSIS — R413 Other amnesia: Secondary | ICD-10-CM

## 2014-07-08 DIAGNOSIS — Z7189 Other specified counseling: Secondary | ICD-10-CM | POA: Insufficient documentation

## 2014-07-08 DIAGNOSIS — E1149 Type 2 diabetes mellitus with other diabetic neurological complication: Secondary | ICD-10-CM

## 2014-07-08 DIAGNOSIS — E1142 Type 2 diabetes mellitus with diabetic polyneuropathy: Secondary | ICD-10-CM

## 2014-07-08 DIAGNOSIS — F411 Generalized anxiety disorder: Secondary | ICD-10-CM

## 2014-07-08 DIAGNOSIS — G3184 Mild cognitive impairment, so stated: Secondary | ICD-10-CM

## 2014-07-08 DIAGNOSIS — I10 Essential (primary) hypertension: Secondary | ICD-10-CM

## 2014-07-08 DIAGNOSIS — Z0000 Encounter for general adult medical examination without abnormal findings: Secondary | ICD-10-CM

## 2014-07-08 LAB — HM DIABETES FOOT EXAM

## 2014-07-08 NOTE — Progress Notes (Signed)
Pre visit review using our clinic review tool, if applicable. No additional management support is needed unless otherwise documented below in the visit note. 

## 2014-07-08 NOTE — Assessment & Plan Note (Signed)
Lab Results  Component Value Date   HGBA1C 7.5* 06/29/2014   Good control still fastings back under 120

## 2014-07-08 NOTE — Assessment & Plan Note (Signed)
He is in some denial about the memory issues No functional decline Asked him to review with son so he can monitor (and bring in son next time if possible)

## 2014-07-08 NOTE — Progress Notes (Signed)
Subjective:    Patient ID: KENDRA WOOLFORD, male    DOB: [birth year], 78 y.o.   MRN: [id number]  HPI Here for Medicare wellness and follow up Reviewed advanced directives No falls Did have some depressed mood with recent sugar and edema problems. No anhedonia Sees Dr Hardin Negus for acute care at times, [facility] for his eyes Reviewed meds No tobacco or alcohol Tries to walk reguarly No vision or hearing problems No cognitive problems  Had spell of seeing spots Some swelling in legs Sugars up 150-180 fasting Now feels back to normal for the most part Sugars now back under 120 Vision okay--due to go back to [facility] soon for glaucoma follow up  Overall diabetes control still fine No symptoms of hypoglycemia Still has feet tingling--the swelling is better No ulcers or pain  No chest pain No SOB--will exercise when feet are not swollen Tries to walk 3 days per week- and keeps up yard  Voids okay Nocturia x 1 usually No daytime urgency or increased frequency (other than close to bedtime) No hematuria  Continues on fish oil Tries to eat right Weight is stable  Current Outpatient Prescriptions on File Prior to Visit  Medication Sig Dispense Refill  . ACCU-CHEK COMPACT TEST DRUM test strip 1 each as needed.       . ALPRAZolam (XANAX) 1 MG tablet Take 0.5-1 mg by mouth 2 (two) times daily as needed.        Marland Kitchen aspirin 81 MG tablet Take 81 mg by mouth daily.        . fish oil-omega-3 fatty acids 1000 MG capsule Take 2 g by mouth daily.        Marland Kitchen glimepiride (AMARYL) 4 MG tablet Take 4 mg by mouth 2 (two) times daily.        Marland Kitchen latanoprost (XALATAN) 0.005 % ophthalmic solution Place 1 drop into both eyes Daily.      Marland Kitchen lisinopril (PRINIVIL,ZESTRIL) 40 MG tablet Take 40 mg by mouth daily.        . metFORMIN (GLUCOPHAGE) 1000 MG tablet TAKE 1 TABLET BY MOUTH BEFORE BREAKFAST AND DINNER, AND 1/2 TABLET ATBEDTIME FOR DIABETES  75 tablet  0  . ranitidine (ZANTAC) 150 MG tablet  Take 150 mg by mouth 2 (two) times daily. For acid reflux symptoms       . verapamil (CALAN-SR) 240 MG CR tablet Take 240 mg by mouth at bedtime.         No current facility-administered medications on file prior to visit.    No Known Allergies  Past Medical History  Diagnosis Date  . Anxiety   . Diabetes mellitus   . GERD (gastroesophageal reflux disease)   . Hypertension   . Peptic ulcer disease   . Rosacea   . Hyperlipidemia   . Renal cyst   . Irregular heartbeat     Dr. (his) stated that it was "nothing to worry about."    Past Surgical History  Procedure Laterality Date  . Cholecystectomy    . Partial gastrectomy  1974  . Knee arthroscopy      left    Family History  Problem Relation Age of Onset  . Cancer Brother     prostate  . Colon cancer Brother   . Cancer Sister   . Cancer Brother     prostate cancer  . Cancer Brother     prostate    History   Social History  . Marital Status: Married  Spouse Name: N/A    Number of Children: 2  . Years of Education: N/A   Occupational History  . RETIRED, electrician    Social History Main Topics  . Smoking status: Never Smoker   . Smokeless tobacco: Never Used  . Alcohol Use: No  . Drug Use: Not on file  . Sexual Activity: Not on file   Other Topics Concern  . Not on file   Social History Narrative   No living will   Asks that son make health care decisions.   Would accept resuscitation attempts   No tube feeds if cognitively unaware   Review of Systems Sleeps well Bowels are fine No rash or suspicious skin spots    Objective:   Physical Exam  Constitutional: He is oriented to person, place, and time. He appears well-developed and well-nourished. No distress.  HENT:  Mouth/Throat: Oropharynx is clear and moist. No oropharyngeal exudate.  Neck: Normal range of motion. Neck supple. No thyromegaly present.  Cardiovascular: Normal rate, regular rhythm, normal heart sounds and intact distal  pulses.  Exam reveals no gallop.   No murmur heard. Pulmonary/Chest: Effort normal and breath sounds normal. No respiratory distress. He has no wheezes. He has no rales.  Abdominal: Soft. There is no tenderness.  Musculoskeletal: He exhibits no edema and no tenderness.  Lymphadenopathy:    He has no cervical adenopathy.  Neurological: He is alert and oriented to person, place, and time.  President-- "Obama, ?" [phone] [id number] Recall 0/3  Skin: No rash noted. No erythema.  No foot lesions  Psychiatric: He has a normal mood and affect. His behavior is normal.          Assessment & Plan:

## 2014-07-08 NOTE — Assessment & Plan Note (Signed)
I have personally reviewed the Medicare Annual Wellness questionnaire and have noted 1. The patient's medical and social history 2. Their use of alcohol, tobacco or illicit drugs 3. Their current medications and supplements 4. The patient's functional ability including ADL's, fall risks, home safety risks and hearing or visual             impairment. 5. Diet and physical activities 6. Evidence for depression or mood disorders  The patients weight, height, BMI and visual acuity have been recorded in the chart I have made referrals, counseling and provided education to the patient based review of the above and I have provided the pt with a written personalized care plan for preventive services.  I have provided you with a copy of your personalized plan for preventive services. Please take the time to review along with your updated medication list.  He wanted PSA---I advised against this States he got his flu shot--UTD with other imms UTD with colon No other interventions  Has some cognitive issues--asked him to discuss with son

## 2014-07-08 NOTE — Assessment & Plan Note (Signed)
BP Readings from Last 3 Encounters:  07/08/14 120/80  06/29/14 116/74  12/10/13 130/80   Good control still

## 2014-07-08 NOTE — Patient Instructions (Signed)
Please bring in your son with your next visit so we can review how your memory is

## 2014-07-08 NOTE — Assessment & Plan Note (Signed)
Uses alprazolam bid regularly Hasn't tolerated wean

## 2014-07-08 NOTE — Assessment & Plan Note (Signed)
Mild without pain so no treatment needed

## 2014-07-08 NOTE — Assessment & Plan Note (Signed)
See social history 

## 2014-08-02 LAB — HM DIABETES EYE EXAM

## 2014-08-03 ENCOUNTER — Other Ambulatory Visit: Payer: Self-pay | Admitting: Internal Medicine

## 2015-01-27 ENCOUNTER — Encounter: Payer: Self-pay | Admitting: Internal Medicine

## 2015-01-27 ENCOUNTER — Ambulatory Visit (INDEPENDENT_AMBULATORY_CARE_PROVIDER_SITE_OTHER): Payer: Commercial Managed Care - HMO | Admitting: Internal Medicine

## 2015-01-27 VITALS — BP 120/80 | HR 70 | Temp 98.0°F | Wt 186.0 lb

## 2015-01-27 DIAGNOSIS — I10 Essential (primary) hypertension: Secondary | ICD-10-CM | POA: Diagnosis not present

## 2015-01-27 DIAGNOSIS — E1149 Type 2 diabetes mellitus with other diabetic neurological complication: Secondary | ICD-10-CM

## 2015-01-27 DIAGNOSIS — S069X0S Unspecified intracranial injury without loss of consciousness, sequela: Secondary | ICD-10-CM | POA: Diagnosis not present

## 2015-01-27 DIAGNOSIS — G3184 Mild cognitive impairment, so stated: Secondary | ICD-10-CM | POA: Diagnosis not present

## 2015-01-27 DIAGNOSIS — E1140 Type 2 diabetes mellitus with diabetic neuropathy, unspecified: Secondary | ICD-10-CM | POA: Diagnosis not present

## 2015-01-27 LAB — COMPREHENSIVE METABOLIC PANEL
ALK PHOS: 48 U/L (ref 39–117)
ALT: 18 U/L (ref 0–53)
AST: 16 U/L (ref 0–37)
Albumin: 4.2 g/dL (ref 3.5–5.2)
BUN: 21 mg/dL (ref 6–23)
CO2: 30 mEq/L (ref 19–32)
CREATININE: 0.99 mg/dL (ref 0.40–1.50)
Calcium: 9.4 mg/dL (ref 8.4–10.5)
Chloride: 102 mEq/L (ref 96–112)
GFR: 78.03 mL/min (ref >60.00)
Glucose, Bld: 171 mg/dL — ABNORMAL HIGH (ref 70–99)
Potassium: 4.8 mEq/L (ref 3.5–5.1)
Sodium: 137 mEq/L (ref 135–145)
Total Bilirubin: 0.4 mg/dL (ref 0.2–1.2)
Total Protein: 7.1 g/dL (ref 6.0–8.3)

## 2015-01-27 LAB — CBC WITH DIFFERENTIAL/PLATELET
Basophils Absolute: 0 10*3/uL (ref 0.0–0.1)
Basophils Relative: 0.3 % (ref 0.0–3.0)
EOS ABS: 0.1 10*3/uL (ref 0.0–0.7)
EOS PCT: 2 % (ref 0.0–5.0)
HCT: 44.5 % (ref 39.0–52.0)
Hemoglobin: 15.2 g/dL (ref 13.0–17.0)
Lymphocytes Relative: 39.6 % (ref 12.0–46.0)
Lymphs Abs: 2.6 10*3/uL (ref 0.7–4.0)
MCHC: 34.1 g/dL (ref 30.0–36.0)
MCV: 92.8 fl (ref 78.0–100.0)
Monocytes Absolute: 0.5 10*3/uL (ref 0.1–1.0)
Monocytes Relative: 7.1 % (ref 3.0–12.0)
NEUTROS ABS: 3.3 10*3/uL (ref 1.4–7.7)
Neutrophils Relative %: 51 % (ref 43.0–77.0)
Platelets: 233 10*3/uL (ref 150.0–400.0)
RBC: 4.8 Mil/uL (ref 4.22–5.81)
RDW: 13.1 % (ref 11.5–15.5)
WBC: 6.5 10*3/uL (ref 4.0–10.5)

## 2015-01-27 LAB — T4, FREE: Free T4: 0.93 ng/dL (ref 0.60–1.60)

## 2015-01-27 LAB — LIPID PANEL
CHOLESTEROL: 160 mg/dL (ref 0–200)
HDL: 34.6 mg/dL — ABNORMAL LOW (ref >39.00)
LDL CALC: 93 mg/dL (ref 0–99)
NonHDL: 125.4
Total CHOL/HDL Ratio: 5
Triglycerides: 162 mg/dL — ABNORMAL HIGH (ref 0.0–149.0)
VLDL: 32.4 mg/dL (ref 0.0–40.0)

## 2015-01-27 LAB — HEMOGLOBIN A1C: HEMOGLOBIN A1C: 7.3 % — AB (ref 4.6–6.5)

## 2015-01-27 NOTE — Progress Notes (Signed)
Subjective:    Patient ID: Patrick [name], male    DOB: 1936-04-20, 79 y.o.   MRN: [id number]  HPI Here for follow up of chronic medical conditions Son is here   Reviewed his memory No apparent change Son is not aware any cognitive problems  He notes occasional vertigo He relates this to concussion from fall about 11 years ago This is intermittent-- every 1-2 weeks This gets him anxious especially in car Uses the alprazolam prn in those cases  Checks his sugars every morning and prn (if he feels it is dropping) Runs 87-150's Mostly under 130 No serious hypoglycemic spells Still with burning and sensation changes in feet. It limits his walking  No chest pain No SOB No separate dizziness Rare edema in evening--like if up on feet a long time. Better in AM  Current Outpatient Prescriptions on File Prior to Visit  Medication Sig Dispense Refill  . ACCU-CHEK COMPACT TEST DRUM test strip 1 each as needed.     . ALPRAZolam (XANAX) 1 MG tablet Take 0.5-1 mg by mouth 2 (two) times daily as needed.      Marland Kitchen aspirin 81 MG tablet Take 81 mg by mouth daily.      . fish oil-omega-3 fatty acids 1000 MG capsule Take 2 g by mouth daily.      Marland Kitchen glimepiride (AMARYL) 4 MG tablet Take 4 mg by mouth 2 (two) times daily.      Marland Kitchen latanoprost (XALATAN) 0.005 % ophthalmic solution Place 1 drop into both eyes Daily.    Marland Kitchen lisinopril (PRINIVIL,ZESTRIL) 40 MG tablet Take 40 mg by mouth daily.      . metFORMIN (GLUCOPHAGE) 1000 MG tablet TAKE 1 TABLET BY MOUTH BEFORE BREAKFAST AND DINNER, AND 1/2 TABLET ATBEDTIME FOR DIABETES 75 tablet 5  . ranitidine (ZANTAC) 150 MG tablet Take 150 mg by mouth 2 (two) times daily. For acid reflux symptoms     . verapamil (CALAN-SR) 240 MG CR tablet Take 240 mg by mouth at bedtime.       No current facility-administered medications on file prior to visit.    No Known Allergies  Past Medical History  Diagnosis Date  . Anxiety   . Diabetes mellitus   . GERD  (gastroesophageal reflux disease)   . Hypertension   . Peptic ulcer disease   . Rosacea   . Hyperlipidemia   . Renal cyst   . Irregular heartbeat     Dr. (his) stated that it was "nothing to worry about."    Past Surgical History  Procedure Laterality Date  . Cholecystectomy    . Partial gastrectomy  1974  . Knee arthroscopy      left    Family History  Problem Relation Age of Onset  . Cancer Brother     prostate  . Colon cancer Brother   . Cancer Sister   . Cancer Brother     prostate cancer  . Cancer Brother     prostate    History   Social History  . Marital Status: Married    Spouse Name: N/A  . Number of Children: 2  . Years of Education: N/A   Occupational History  . RETIRED, electrician    Social History Main Topics  . Smoking status: Never Smoker   . Smokeless tobacco: Never Used  . Alcohol Use: No  . Drug Use: Not on file  . Sexual Activity: Not on file   Other Topics Concern  . Not  on file   Social History Narrative   No living will   Asks that son make health care decisions.   Would accept resuscitation attempts   No tube feeds if cognitively unaware   Review of Systems Appetite is good Weight stable Sleeps fine Bowels good No trouble voiding    Objective:   Physical Exam  Constitutional: He appears well-developed and well-nourished. No distress.  Neck: Normal range of motion. Neck supple. No thyromegaly present.  Cardiovascular: Normal rate, regular rhythm, normal heart sounds and intact distal pulses.  Exam reveals no gallop.   No murmur heard. Pulmonary/Chest: Effort normal and breath sounds normal. No respiratory distress. He has no wheezes. He has no rales.  Musculoskeletal: He exhibits no edema or tenderness.  Lymphadenopathy:    He has no cervical adenopathy.  Skin:  No foot lesions  Psychiatric: He has a normal mood and affect. His behavior is normal.          Assessment & Plan:

## 2015-01-27 NOTE — Assessment & Plan Note (Signed)
Seems to have good control Checks regularly Mild neuropathy without sig pain---so no Rx

## 2015-01-27 NOTE — Assessment & Plan Note (Signed)
Per son, his cognition is fine and stable Will just monitor and repeat testing yearly

## 2015-01-27 NOTE — Assessment & Plan Note (Signed)
BP Readings from Last 3 Encounters:  01/27/15 120/80  07/08/14 120/80  06/29/14 116/74   Good control No changes needed

## 2015-01-27 NOTE — Progress Notes (Signed)
Pre visit review using our clinic review tool, if applicable. No additional management support is needed unless otherwise documented below in the visit note. 

## 2015-01-27 NOTE — Assessment & Plan Note (Signed)
He still gets some vertigo and secondary anxiety from past concussion Does okay but limits driving and uses the alprazolam prn

## 2015-01-30 ENCOUNTER — Encounter: Payer: Self-pay | Admitting: *Deleted

## 2015-01-31 ENCOUNTER — Other Ambulatory Visit: Payer: Self-pay | Admitting: Internal Medicine

## 2015-05-29 ENCOUNTER — Encounter: Payer: Self-pay | Admitting: Gastroenterology

## 2015-07-18 ENCOUNTER — Ambulatory Visit (INDEPENDENT_AMBULATORY_CARE_PROVIDER_SITE_OTHER): Payer: Commercial Managed Care - HMO

## 2015-07-18 DIAGNOSIS — Z23 Encounter for immunization: Secondary | ICD-10-CM

## 2015-08-04 ENCOUNTER — Ambulatory Visit (INDEPENDENT_AMBULATORY_CARE_PROVIDER_SITE_OTHER): Payer: Commercial Managed Care - HMO | Admitting: Internal Medicine

## 2015-08-04 ENCOUNTER — Encounter: Payer: Self-pay | Admitting: Internal Medicine

## 2015-08-04 ENCOUNTER — Encounter: Payer: Self-pay | Admitting: *Deleted

## 2015-08-04 VITALS — BP 122/70 | HR 81 | Temp 97.5°F | Wt 187.0 lb

## 2015-08-04 DIAGNOSIS — E1149 Type 2 diabetes mellitus with other diabetic neurological complication: Secondary | ICD-10-CM

## 2015-08-04 DIAGNOSIS — I10 Essential (primary) hypertension: Secondary | ICD-10-CM | POA: Diagnosis not present

## 2015-08-04 DIAGNOSIS — F39 Unspecified mood [affective] disorder: Secondary | ICD-10-CM | POA: Diagnosis not present

## 2015-08-04 DIAGNOSIS — S069X0S Unspecified intracranial injury without loss of consciousness, sequela: Secondary | ICD-10-CM | POA: Diagnosis not present

## 2015-08-04 LAB — HM DIABETES FOOT EXAM

## 2015-08-04 LAB — HEMOGLOBIN A1C: HEMOGLOBIN A1C: 7.2 % — AB (ref 4.6–6.5)

## 2015-08-04 NOTE — Assessment & Plan Note (Signed)
Seems to still be well controlled Will check A1c

## 2015-08-04 NOTE — Assessment & Plan Note (Signed)
Mostly just mild anxiety Does fine with the alprazolam

## 2015-08-04 NOTE — Assessment & Plan Note (Signed)
Cognition stable No dizziness lately

## 2015-08-04 NOTE — Progress Notes (Signed)
Pre visit review using our clinic review tool, if applicable. No additional management support is needed unless otherwise documented below in the visit note. 

## 2015-08-04 NOTE — Progress Notes (Signed)
Subjective:    Patient ID: Patrick [name], male    DOB: [birth year], 79 y.o.   MRN: [id number]  HPI Here for follow up of diabetes and other chronic medical conditions  Feels better now that it is not so hot Concerned about "warts" on chest and back  Vision is okay Glaucoma suspect in left eye Due for eye appt--needs referral  Checks sugars daily in AM 74-159---most under 130 No hypoglycemic reactions Starting to walk again that it is not hot No sores or numbness in feet  Some foot swelling after working outside--rare Better with elevation No chest pain or SOB No palpitations No dizziness or syncope  Mood has been okay Anxiety is controlled with daily alprazolam (may need second if rough day) No depression or anhedonia  Current Outpatient Prescriptions on File Prior to Visit  Medication Sig Dispense Refill  . ACCU-CHEK COMPACT TEST DRUM test strip 1 each as needed.     . ALPRAZolam (XANAX) 1 MG tablet Take 0.5-1 mg by mouth 2 (two) times daily as needed.      Marland Kitchen aspirin 81 MG tablet Take 81 mg by mouth daily.      . fish oil-omega-3 fatty acids 1000 MG capsule Take 2 g by mouth daily.      Marland Kitchen glimepiride (AMARYL) 4 MG tablet Take 4 mg by mouth 2 (two) times daily.      Marland Kitchen latanoprost (XALATAN) 0.005 % ophthalmic solution Place 1 drop into both eyes Daily.    Marland Kitchen lisinopril (PRINIVIL,ZESTRIL) 40 MG tablet Take 40 mg by mouth daily.      . metFORMIN (GLUCOPHAGE) 1000 MG tablet TAKE 1 TABLET BY MOUTH BEFORE BREAKFAST AND DINNER, AND 1/2 A TABLET BY MOUTH ATBEDTIME FOR DIABETES 75 tablet 11  . ranitidine (ZANTAC) 150 MG tablet Take 150 mg by mouth 2 (two) times daily. For acid reflux symptoms     . verapamil (CALAN-SR) 240 MG CR tablet Take 240 mg by mouth at bedtime.       No current facility-administered medications on file prior to visit.    No Known Allergies  Past Medical History  Diagnosis Date  . Anxiety   . Diabetes mellitus   . GERD (gastroesophageal reflux  disease)   . Hypertension   . Peptic ulcer disease   . Rosacea   . Hyperlipidemia   . Renal cyst   . Irregular heartbeat     Dr. (his) stated that it was "nothing to worry about."    Past Surgical History  Procedure Laterality Date  . Cholecystectomy    . Partial gastrectomy  1974  . Knee arthroscopy      left    Family History  Problem Relation Age of Onset  . Cancer Brother     prostate  . Colon cancer Brother   . Cancer Sister   . Cancer Brother     prostate cancer  . Cancer Brother     prostate    Social History   Social History  . Marital Status: Married    Spouse Name: N/A  . Number of Children: 2  . Years of Education: N/A   Occupational History  . RETIRED, electrician    Social History Main Topics  . Smoking status: Never Smoker   . Smokeless tobacco: Never Used  . Alcohol Use: No  . Drug Use: Not on file  . Sexual Activity: Not on file   Other Topics Concern  . Not on file   Social  History Narrative   No living will   Asks that son make health care decisions.   Would accept resuscitation attempts   No tube feeds if cognitively unaware   Review of Systems Sleeps well Appetite is good---has tried to cut back Weight stable No falls    Objective:   Physical Exam  Constitutional: He appears well-developed and well-nourished. No distress.  Neck: Normal range of motion. Neck supple. No thyromegaly present.  Cardiovascular: Normal rate, regular rhythm, normal heart sounds and intact distal pulses.  Exam reveals no gallop.   No murmur heard. Pulmonary/Chest: Effort normal and breath sounds normal. No respiratory distress. He has no wheezes. He has no rales.  Musculoskeletal:  Trace ankle edema  Lymphadenopathy:    He has no cervical adenopathy.  Neurological:  Normal sensation in plantar feet  Skin: No rash noted.  Seborrheic keratoses on back and non specific papules on upper left chest No foot lesions           Assessment & Plan:

## 2015-08-04 NOTE — Assessment & Plan Note (Signed)
BP Readings from Last 3 Encounters:  08/04/15 122/70  01/27/15 120/80  07/08/14 120/80   Good control No changes needed

## 2016-01-17 ENCOUNTER — Other Ambulatory Visit: Payer: Self-pay | Admitting: Internal Medicine

## 2016-02-02 ENCOUNTER — Encounter: Payer: Self-pay | Admitting: Internal Medicine

## 2016-02-02 ENCOUNTER — Ambulatory Visit (INDEPENDENT_AMBULATORY_CARE_PROVIDER_SITE_OTHER): Payer: Commercial Managed Care - HMO | Admitting: Internal Medicine

## 2016-02-02 VITALS — BP 118/80 | HR 75 | Temp 97.5°F | Wt 190.0 lb

## 2016-02-02 DIAGNOSIS — E1149 Type 2 diabetes mellitus with other diabetic neurological complication: Secondary | ICD-10-CM | POA: Diagnosis not present

## 2016-02-02 DIAGNOSIS — F39 Unspecified mood [affective] disorder: Secondary | ICD-10-CM

## 2016-02-02 DIAGNOSIS — I10 Essential (primary) hypertension: Secondary | ICD-10-CM | POA: Diagnosis not present

## 2016-02-02 DIAGNOSIS — S069X0S Unspecified intracranial injury without loss of consciousness, sequela: Secondary | ICD-10-CM

## 2016-02-02 LAB — LIPID PANEL
CHOL/HDL RATIO: 4
CHOLESTEROL: 171 mg/dL (ref 0–200)
HDL: 39.7 mg/dL (ref >39.00)
LDL Cholesterol: 97 mg/dL (ref 0–99)
NonHDL: 130.81
TRIGLYCERIDES: 167 mg/dL — AB (ref 0.0–149.0)
VLDL: 33.4 mg/dL (ref 0.0–40.0)

## 2016-02-02 LAB — CBC WITH DIFFERENTIAL/PLATELET
BASOS ABS: 0 10*3/uL (ref 0.0–0.1)
BASOS PCT: 0.4 % (ref 0.0–3.0)
EOS ABS: 0.1 10*3/uL (ref 0.0–0.7)
Eosinophils Relative: 2 % (ref 0.0–5.0)
HEMATOCRIT: 43.6 % (ref 39.0–52.0)
HEMOGLOBIN: 14.5 g/dL (ref 13.0–17.0)
LYMPHS PCT: 37.3 % (ref 12.0–46.0)
Lymphs Abs: 2.4 10*3/uL (ref 0.7–4.0)
MCHC: 33.3 g/dL (ref 30.0–36.0)
MCV: 94.4 fl (ref 78.0–100.0)
MONO ABS: 0.5 10*3/uL (ref 0.1–1.0)
Monocytes Relative: 8.2 % (ref 3.0–12.0)
Neutro Abs: 3.3 10*3/uL (ref 1.4–7.7)
Neutrophils Relative %: 52.1 % (ref 43.0–77.0)
Platelets: 253 10*3/uL (ref 150.0–400.0)
RBC: 4.62 Mil/uL (ref 4.22–5.81)
RDW: 13.6 % (ref 11.5–15.5)
WBC: 6.4 10*3/uL (ref 4.0–10.5)

## 2016-02-02 LAB — COMPREHENSIVE METABOLIC PANEL
ALBUMIN: 4.3 g/dL (ref 3.5–5.2)
ALK PHOS: 50 U/L (ref 39–117)
ALT: 18 U/L (ref 0–53)
AST: 16 U/L (ref 0–37)
BILIRUBIN TOTAL: 0.5 mg/dL (ref 0.2–1.2)
BUN: 25 mg/dL — AB (ref 6–23)
CALCIUM: 10.1 mg/dL (ref 8.4–10.5)
CO2: 29 mEq/L (ref 19–32)
CREATININE: 1 mg/dL (ref 0.40–1.50)
Chloride: 102 mEq/L (ref 96–112)
GFR: 76.92 mL/min (ref >60.00)
Glucose, Bld: 181 mg/dL — ABNORMAL HIGH (ref 70–99)
Potassium: 5 mEq/L (ref 3.5–5.1)
SODIUM: 138 meq/L (ref 135–145)
TOTAL PROTEIN: 7.2 g/dL (ref 6.0–8.3)

## 2016-02-02 LAB — HEMOGLOBIN A1C: Hgb A1c MFr Bld: 7.6 % — ABNORMAL HIGH (ref 4.6–6.5)

## 2016-02-02 MED ORDER — GABAPENTIN 300 MG PO CAPS: 300.0000 mg | ORAL_CAPSULE | Freq: Every day | ORAL | Status: DC

## 2016-02-02 NOTE — Assessment & Plan Note (Signed)
Ongoing anxiety as well Discussed trying to cut back on the alprazolam

## 2016-02-02 NOTE — Progress Notes (Signed)
Pre visit review using our clinic review tool, if applicable. No additional management support is needed unless otherwise documented below in the visit note. 

## 2016-02-02 NOTE — Patient Instructions (Signed)
Please start the gabapentin for the pain in your feet. Take 1 at bedtime for a week or so. If no problems, but the pain continues then, you can increase to 2. Try not to take the alprazolam with this. Please let me know if you have any problems with this, or if it isn't helping. Also let me know if it helps at night but you still have problems during the day.

## 2016-02-02 NOTE — Assessment & Plan Note (Signed)
Still gets vertigo at times Feels the alprazolam helps

## 2016-02-02 NOTE — Progress Notes (Signed)
Subjective:    Patient ID: Patrick [name], male    DOB: [birth year], 80 y.o.   MRN: [id number]  HPI Here for follow up of chronic health conditions---including diabetes  Checks sugars every morning 106-160 Did have 1 spell of the shakes at night---sugar 56. Better with peanut butter crackers Having more trouble with foot pain and stinging. Worst at night but is affecting him during the day and affecting his walking  Still gets vertigo--related to past concussion Alprazolam will help that Uses twice a day frequently to prevent the dizzy feeling Also gets anxiety at times--like out in the car  No chest pain No SOB No syncope Mild edema  Current Outpatient Prescriptions on File Prior to Visit  Medication Sig Dispense Refill  . ACCU-CHEK COMPACT TEST DRUM test strip 1 each as needed.     . ALPRAZolam (XANAX) 1 MG tablet Take 0.5-1 mg by mouth 2 (two) times daily as needed.      Marland Kitchen aspirin 81 MG tablet Take 81 mg by mouth daily.      . fish oil-omega-3 fatty acids 1000 MG capsule Take 2 g by mouth daily.      Marland Kitchen glimepiride (AMARYL) 4 MG tablet TAKE 1 TABLET BY MOUTH TWICE A DAY FOR DIABETES 60 tablet 0  . latanoprost (XALATAN) 0.005 % ophthalmic solution Place 1 drop into both eyes Daily.    Marland Kitchen lisinopril (PRINIVIL,ZESTRIL) 40 MG tablet TAKE 1 TABLET BY MOUTH EVERY MORNING FORBLOOD PRESSURE 30 tablet 0  . metFORMIN (GLUCOPHAGE) 1000 MG tablet TAKE 1 TABLET BY MOUTH BEFORE BREAKFAST AND DINNER, AND TAKE 1/2 TABLET AT BEDTIME FOR DIABETES 45 tablet 0  . ranitidine (ZANTAC) 150 MG tablet Take 150 mg by mouth 2 (two) times daily. For acid reflux symptoms     . verapamil (CALAN-SR) 240 MG CR tablet Take 240 mg by mouth at bedtime.       No current facility-administered medications on file prior to visit.    No Known Allergies  Past Medical History  Diagnosis Date  . Anxiety   . Diabetes mellitus   . GERD (gastroesophageal reflux disease)   . Hypertension   . Peptic ulcer disease    . Rosacea   . Hyperlipidemia   . Renal cyst   . Irregular heartbeat     Dr. (his) stated that it was "nothing to worry about."    Past Surgical History  Procedure Laterality Date  . Cholecystectomy    . Partial gastrectomy  1974  . Knee arthroscopy      left    Family History  Problem Relation Age of Onset  . Cancer Brother     prostate  . Colon cancer Brother   . Cancer Sister   . Cancer Brother     prostate cancer  . Cancer Brother     prostate    Social History   Social History  . Marital Status: Married    Spouse Name: N/A  . Number of Children: 2  . Years of Education: N/A   Occupational History  . RETIRED, electrician    Social History Main Topics  . Smoking status: Never Smoker   . Smokeless tobacco: Never Used  . Alcohol Use: No  . Drug Use: Not on file  . Sexual Activity: Not on file   Other Topics Concern  . Not on file   Social History Narrative   No living will   Asks that son make health care decisions.  Would accept resuscitation attempts   No tube feeds if cognitively unaware   Review of Systems Has gained 3# Trying to walk but "my feet are killing me" Occasional ankle swelling Stress in family--brother died around Christmas, sister with MI    Objective:   Physical Exam  Constitutional: He appears well-developed and well-nourished. No distress.  Neck: Normal range of motion. Neck supple. No thyromegaly present.  Cardiovascular: Normal rate, regular rhythm, normal heart sounds and intact distal pulses.  Exam reveals no gallop.   No murmur heard. Pulmonary/Chest: Effort normal and breath sounds normal. No respiratory distress. He has no wheezes. He has no rales.  Musculoskeletal: He exhibits no edema.  Lymphadenopathy:    He has no cervical adenopathy.  Neurological:  Sig decreased sensation in feet  Skin:  No foot lesions  Psychiatric:  Mildly anxious--paces around the room, etc          Assessment & Plan:

## 2016-02-02 NOTE — Assessment & Plan Note (Signed)
BP Readings from Last 3 Encounters:  02/02/16 118/80  08/04/15 122/70  01/27/15 120/80   Good control No changes needed

## 2016-02-02 NOTE — Assessment & Plan Note (Signed)
Seems to still have good control Neuropathy very much worse--will try starting gabapentin

## 2016-02-19 ENCOUNTER — Other Ambulatory Visit: Payer: Self-pay | Admitting: Internal Medicine

## 2016-02-19 NOTE — Telephone Encounter (Signed)
Approved: #60 x 0 

## 2016-02-19 NOTE — Telephone Encounter (Signed)
Left refill on voice mail at pharmacy  

## 2016-02-19 NOTE — Telephone Encounter (Signed)
Last filled 01-18-16 #60 Last OV 02-02-16 No Future OV

## 2016-03-19 ENCOUNTER — Other Ambulatory Visit: Payer: Self-pay | Admitting: Internal Medicine

## 2016-03-19 NOTE — Telephone Encounter (Signed)
Pharmacy line is busy.

## 2016-03-19 NOTE — Telephone Encounter (Signed)
Last filled 02-19-16 #60 Last OV 02-02-16 No Future OV

## 2016-03-19 NOTE — Telephone Encounter (Signed)
Approved: #60 x 0 

## 2016-03-19 NOTE — Telephone Encounter (Signed)
Left refill on voice mail at pharmacy  

## 2016-04-16 ENCOUNTER — Other Ambulatory Visit: Payer: Self-pay | Admitting: Internal Medicine

## 2016-04-16 ENCOUNTER — Telehealth: Payer: Self-pay

## 2016-04-16 NOTE — Telephone Encounter (Signed)
I called the patient to set up a 96month follow-up appointment for October. He wanted to let you know he stopped taking thgabapentin. He said it made him "crazy". He felt nauseous, dizzy, lightheaded. He tried taking less of the medication and still felt bad. So, he stopped it all together. He wanted you to know so you could discuss it at his OV in October. Unless you want to try something else now.

## 2016-04-16 NOTE — Telephone Encounter (Signed)
Approved: #60 x 0 Have him schedule follow up in October or November

## 2016-04-16 NOTE — Telephone Encounter (Signed)
Called in alprazolam on voice mail. Verapamil sent electronically. Spoke to pt. Made appointment 08-07-16

## 2016-04-16 NOTE — Telephone Encounter (Signed)
Alprazolam last filled 03-19-16 #60. Last OV 02-02-16 No Future Visit

## 2016-04-17 NOTE — Telephone Encounter (Signed)
Left message to call office

## 2016-04-17 NOTE — Telephone Encounter (Signed)
Spoke to the patient. He said for now, he thinks he can wait until October. He will call if not.

## 2016-04-17 NOTE — Telephone Encounter (Signed)
No, he was right to stop it if he had those effects. Please put it on his allergy list. If he has symptoms that can't wait till the October visit, have him let me know and I can consider alternatives. Otherwise, we can wait.

## 2016-04-17 NOTE — Telephone Encounter (Signed)
Pt returned your call best number [phone]

## 2016-05-21 LAB — HM DIABETES EYE EXAM

## 2016-05-22 ENCOUNTER — Other Ambulatory Visit: Payer: Self-pay | Admitting: *Deleted

## 2016-05-22 MED ORDER — ALPRAZOLAM 1 MG PO TABS: ORAL_TABLET | ORAL | 0 refills | Status: DC

## 2016-05-22 NOTE — Telephone Encounter (Signed)
Rx called in to pharmacy. 

## 2016-05-22 NOTE — Telephone Encounter (Signed)
Ok to refill in Dr. Letvak's absence? 

## 2016-05-22 NOTE — Telephone Encounter (Signed)
Ok to phone in Xanax 

## 2016-06-06 ENCOUNTER — Encounter: Payer: Self-pay | Admitting: Internal Medicine

## 2016-06-24 ENCOUNTER — Other Ambulatory Visit: Payer: Self-pay

## 2016-06-24 MED ORDER — ALPRAZOLAM 1 MG PO TABS: ORAL_TABLET | ORAL | 0 refills | Status: DC

## 2016-06-24 NOTE — Telephone Encounter (Signed)
Left refill on voice mail at pharmacy  

## 2016-06-24 NOTE — Telephone Encounter (Signed)
Approved: #60 x 0 

## 2016-06-24 NOTE — Telephone Encounter (Signed)
Last filled 05-23-16 #60 Last OV 02-02-16 Next OV 08-07-16

## 2016-07-10 ENCOUNTER — Ambulatory Visit (INDEPENDENT_AMBULATORY_CARE_PROVIDER_SITE_OTHER): Payer: Commercial Managed Care - HMO | Admitting: Internal Medicine

## 2016-07-10 ENCOUNTER — Encounter: Payer: Self-pay | Admitting: Internal Medicine

## 2016-07-10 VITALS — BP 138/86 | HR 77 | Temp 97.6°F | Ht 66.5 in | Wt 191.0 lb

## 2016-07-10 DIAGNOSIS — Z7189 Other specified counseling: Secondary | ICD-10-CM

## 2016-07-10 DIAGNOSIS — R4689 Other symptoms and signs involving appearance and behavior: Secondary | ICD-10-CM

## 2016-07-10 DIAGNOSIS — E1149 Type 2 diabetes mellitus with other diabetic neurological complication: Secondary | ICD-10-CM | POA: Diagnosis not present

## 2016-07-10 DIAGNOSIS — Z0000 Encounter for general adult medical examination without abnormal findings: Secondary | ICD-10-CM | POA: Diagnosis not present

## 2016-07-10 DIAGNOSIS — Z23 Encounter for immunization: Secondary | ICD-10-CM

## 2016-07-10 DIAGNOSIS — I10 Essential (primary) hypertension: Secondary | ICD-10-CM

## 2016-07-10 DIAGNOSIS — F39 Unspecified mood [affective] disorder: Secondary | ICD-10-CM | POA: Diagnosis not present

## 2016-07-10 DIAGNOSIS — S069X0S Unspecified intracranial injury without loss of consciousness, sequela: Secondary | ICD-10-CM | POA: Diagnosis not present

## 2016-07-10 LAB — HM DIABETES FOOT EXAM

## 2016-07-10 LAB — HEMOGLOBIN A1C: Hgb A1c MFr Bld: 7.5 % — ABNORMAL HIGH (ref 4.6–6.5)

## 2016-07-10 NOTE — Patient Instructions (Signed)
DASH Eating Plan  DASH stands for "Dietary Approaches to Stop Hypertension." The DASH eating plan is a healthy eating plan that has been shown to reduce high blood pressure (hypertension). Additional health benefits may include reducing the risk of type 2 diabetes mellitus, heart disease, and stroke. The DASH eating plan may also help with weight loss.  WHAT DO I NEED TO KNOW ABOUT THE DASH EATING PLAN?  For the DASH eating plan, you will follow these general guidelines:  · Choose foods with a percent daily value for sodium of less than 5% (as listed on the food label).  · Use salt-free seasonings or herbs instead of table salt or sea salt.  · Check with your health care provider or pharmacist before using salt substitutes.  · Eat lower-sodium products, often labeled as "lower sodium" or "no salt added."  · Eat fresh foods.  · Eat more vegetables, fruits, and low-fat dairy products.  · Choose whole grains. Look for the word "whole" as the first word in the ingredient list.  · Choose fish and skinless chicken or turkey more often than red meat. Limit fish, poultry, and meat to 6 oz (170 g) each day.  · Limit sweets, desserts, sugars, and sugary drinks.  · Choose heart-healthy fats.  · Limit cheese to 1 oz (28 g) per day.  · Eat more home-cooked food and less restaurant, buffet, and fast food.  · Limit fried foods.  · Cook foods using methods other than frying.  · Limit canned vegetables. If you do use them, rinse them well to decrease the sodium.  · When eating at a restaurant, ask that your food be prepared with less salt, or no salt if possible.  WHAT FOODS CAN I EAT?  Seek help from a dietitian for individual calorie needs.  Grains  Whole grain or whole wheat bread. Brown rice. Whole grain or whole wheat pasta. Quinoa, bulgur, and whole grain cereals. Low-sodium cereals. Corn or whole wheat flour tortillas. Whole grain cornbread. Whole grain crackers. Low-sodium crackers.  Vegetables  Fresh or frozen vegetables  (raw, steamed, roasted, or grilled). Low-sodium or reduced-sodium tomato and vegetable juices. Low-sodium or reduced-sodium tomato sauce and paste. Low-sodium or reduced-sodium canned vegetables.   Fruits  All fresh, canned (in natural juice), or frozen fruits.  Meat and Other Protein Products  Ground beef (85% or leaner), grass-fed beef, or beef trimmed of fat. Skinless chicken or turkey. Ground chicken or turkey. Pork trimmed of fat. All fish and seafood. Eggs. Dried beans, peas, or lentils. Unsalted nuts and seeds. Unsalted canned beans.  Dairy  Low-fat dairy products, such as skim or 1% milk, 2% or reduced-fat cheeses, low-fat ricotta or cottage cheese, or plain low-fat yogurt. Low-sodium or reduced-sodium cheeses.  Fats and Oils  Tub margarines without trans fats. Light or reduced-fat mayonnaise and salad dressings (reduced sodium). Avocado. Safflower, olive, or canola oils. Natural peanut or almond butter.  Other  Unsalted popcorn and pretzels.  The items listed above may not be a complete list of recommended foods or beverages. Contact your dietitian for more options.  WHAT FOODS ARE NOT RECOMMENDED?  Grains  White bread. White pasta. White rice. Refined cornbread. Bagels and croissants. Crackers that contain trans fat.  Vegetables  Creamed or fried vegetables. Vegetables in a cheese sauce. Regular canned vegetables. Regular canned tomato sauce and paste. Regular tomato and vegetable juices.  Fruits  Dried fruits. Canned fruit in light or heavy syrup. Fruit juice.  Meat and Other Protein   Products  Fatty cuts of meat. Ribs, chicken wings, bacon, sausage, bologna, salami, chitterlings, fatback, hot dogs, bratwurst, and packaged luncheon meats. Salted nuts and seeds. Canned beans with salt.  Dairy  Whole or 2% milk, cream, half-and-half, and cream cheese. Whole-fat or sweetened yogurt. Full-fat cheeses or blue cheese. Nondairy creamers and whipped toppings. Processed cheese, cheese spreads, or cheese  curds.  Condiments  Onion and garlic salt, seasoned salt, table salt, and sea salt. Canned and packaged gravies. Worcestershire sauce. Tartar sauce. Barbecue sauce. Teriyaki sauce. Soy sauce, including reduced sodium. Steak sauce. Fish sauce. Oyster sauce. Cocktail sauce. Horseradish. Ketchup and mustard. Meat flavorings and tenderizers. Bouillon cubes. Hot sauce. Tabasco sauce. Marinades. Taco seasonings. Relishes.  Fats and Oils  Butter, stick margarine, lard, shortening, ghee, and bacon fat. Coconut, palm kernel, or palm oils. Regular salad dressings.  Other  Pickles and olives. Salted popcorn and pretzels.  The items listed above may not be a complete list of foods and beverages to avoid. Contact your dietitian for more information.  WHERE CAN I FIND MORE INFORMATION?  National Heart, Lung, and [employer] Institute: www.nhlbi.nih.gov/health/health-topics/topics/dash/     This information is not intended to replace advice given to you by your health care provider. Make sure you discuss any questions you have with your health care provider.     Document Released: 10/03/2011 Document Revised: 11/04/2014 Document Reviewed: 08/18/2013  Elsevier Interactive Patient Education ©2016 Elsevier Inc.

## 2016-07-10 NOTE — Assessment & Plan Note (Signed)
BP Readings from Last 3 Encounters:  07/10/16 138/86  02/02/16 118/80  08/04/15 122/70   Good control No changes needed

## 2016-07-10 NOTE — Assessment & Plan Note (Signed)
Now formally done See social history

## 2016-07-10 NOTE — Assessment & Plan Note (Signed)
Stable cognitive changes and chronic vertigo The alprazolam helps the vertigo (couldn't successfully wean down)

## 2016-07-10 NOTE — Addendum Note (Signed)
Addended by: Pilar Grammes on: 07/10/2016 02:17 PM   Modules accepted: Orders

## 2016-07-10 NOTE — Assessment & Plan Note (Signed)
I have personally reviewed the Medicare Annual Wellness questionnaire and have noted 1. The patient's medical and social history 2. Their use of alcohol, tobacco or illicit drugs 3. Their current medications and supplements 4. The patient's functional ability including ADL's, fall risks, home safety risks and hearing or visual             impairment. 5. Diet and physical activities 6. Evidence for depression or mood disorders  The patients weight, height, BMI and visual acuity have been recorded in the chart I have made referrals, counseling and provided education to the patient based review of the above and I have provided the pt with a written personalized care plan for preventive services.  I have provided you with a copy of your personalized plan for preventive services. Please take the time to review along with your updated medication list.  prevnar and flu vaccine today No cancer screening due to age Discussed fitness

## 2016-07-10 NOTE — Assessment & Plan Note (Signed)
Chronic anxiety and some dysthymia Does okay with the alprazolam and social interaction

## 2016-07-10 NOTE — Progress Notes (Signed)
Pre visit review using our clinic review tool, if applicable. No additional management support is needed unless otherwise documented below in the visit note. 

## 2016-07-10 NOTE — Progress Notes (Signed)
Subjective:    Patient ID: Patrick [name], male    DOB: [birth year], 80 y.o.   MRN: [id number]  HPI Here for initial Medicare wellness visit and follow up of chronic health conditions Reviewed form and advanced directives Reviewed other doctors No alcohol or tobacco No falls No worsening of chronic memory problems Stays active with yard work, Social research officer, government. Does some weight training. Considering rejoining the [employer] is okay. No problems with hearing Independent with instrumental ADLs. He still drives but can't drive extended times on interstate Ongoing stable memory problems--since head trauma Chronic mood issues  He checks his sugars every morning --and at night if running up Often under 110--but then will have spells where it is around 150 Has had some shaky spells (like mowing lawn)--may be 80. Will eat something and it gets better Some edema in feet. Ongoing abnormal sensation but no severe pain Uses the gabapentin 300mg  at bedtime (not every night)  No falling or stumbling Still has some vertigo Still needs the alprazolam regularly--tried to cut down but didn't do well (dizziness and panic attacks)  Anxiety is generally controlled with alprazolam Some depressed mood--gets lonely. Better if he visits friends or his girlfriend Not really anhedonic  No chest pain--but gets indigestion if he eats tomatos Uses zantac prn No SOB No palpitations  Current Outpatient Prescriptions on File Prior to Visit  Medication Sig Dispense Refill  . ACCU-CHEK COMPACT TEST DRUM test strip 1 each as needed.     . ALPRAZolam (XANAX) 1 MG tablet TAKE 1 TABLET BY MOUTH 2 TIMES A DAY AS NEEDED 60 tablet 0  . aspirin 81 MG tablet Take 81 mg by mouth daily.      . fish oil-omega-3 fatty acids 1000 MG capsule Take 2 g by mouth daily.      Marland Kitchen glimepiride (AMARYL) 4 MG tablet TAKE 1 TABLET BY MOUTH TWICE A DAY FOR DIABETES 60 tablet 11  . latanoprost (XALATAN) 0.005 % ophthalmic solution Place 1 drop into  both eyes Daily.    Marland Kitchen lisinopril (PRINIVIL,ZESTRIL) 40 MG tablet TAKE 1 TABLET BY MOUTH EVERY MORNING FORBLOOD PRESSURE 30 tablet 11  . metFORMIN (GLUCOPHAGE) 1000 MG tablet TAKE 1 TABLET BY MOUTH BEFORE BREAKFAST AND 1 TABLET BEFORE DINNER, AND TAKE 1/2TABLET AT BEDTIME FOR DIABETES 75 tablet 11  . ranitidine (ZANTAC) 150 MG tablet Take 150 mg by mouth 2 (two) times daily. For acid reflux symptoms     . verapamil (CALAN-SR) 240 MG CR tablet TAKE 1 TABLET BY MOUTH EVERY MORNING FORBLOOD PRESSURE 30 tablet 4   No current facility-administered medications on file prior to visit.     Allergies  Allergen Reactions  . Gabapentin Other (See Comments)    Dizziness, lightheaded, nausea    Past Medical History:  Diagnosis Date  . Anxiety   . Diabetes mellitus   . GERD (gastroesophageal reflux disease)   . Hyperlipidemia   . Hypertension   . Irregular heartbeat    Dr. (his) stated that it was "nothing to worry about."  . Peptic ulcer disease   . Renal cyst   . Rosacea     Past Surgical History:  Procedure Laterality Date  . CHOLECYSTECTOMY    . KNEE ARTHROSCOPY     left  . PARTIAL GASTRECTOMY  1974    Family History  Problem Relation Age of Onset  . Cancer Brother     prostate  . Colon cancer Brother   . Cancer Sister   .  Cancer Brother     prostate cancer  . Cancer Brother     prostate    Social History   Social History  . Marital status: Married    Spouse name: N/A  . Number of children: 2  . Years of education: N/A   Occupational History  . RETIRED, electrician Retired   Social History Main Topics  . Smoking status: Never Smoker  . Smokeless tobacco: Never Used  . Alcohol use No  . Drug use: Unknown  . Sexual activity: Not on file   Other Topics Concern  . Not on file   Social History Narrative   Now has living will   Son is health care POA   Would accept resuscitation attempts   No tube feeds if cognitively unaware   Review of Systems Wears seat  belt Teeth are okay--regular with dentist (Dr Olena Heckle) Appetite is good Weight is stable Sleeps well Bowels are okay. No blood Voids okay. Nocturia x 1 and stable. No daytime problems No rash or suspicious lesions    Objective:   Physical Exam  Constitutional: He is oriented to person, place, and time. He appears well-developed and well-nourished. No distress.  HENT:  Mouth/Throat: Oropharynx is clear and moist. No oropharyngeal exudate.  Neck: Normal range of motion. Neck supple. No thyromegaly present.  Cardiovascular: Normal rate, regular rhythm, normal heart sounds and intact distal pulses.  Exam reveals no gallop.   No murmur heard. Pulmonary/Chest: Effort normal and breath sounds normal. No respiratory distress. He has no wheezes. He has no rales.  Abdominal: Soft. There is no tenderness.  Musculoskeletal: He exhibits no edema or tenderness.  Lymphadenopathy:    He has no cervical adenopathy.  Neurological: He is alert and oriented to person, place, and time.  President-- "Daisy Floro, ?" [phone]? [id number] Recall 2/3  Decreased sensation in feet  Skin: No rash noted. No erythema.  No foot lesions  Psychiatric: He has a normal mood and affect. His behavior is normal.          Assessment & Plan:

## 2016-07-10 NOTE — Assessment & Plan Note (Signed)
Control seems to be okay Using the gabapentin at night

## 2016-07-23 ENCOUNTER — Other Ambulatory Visit: Payer: Self-pay

## 2016-07-23 MED ORDER — ALPRAZOLAM 1 MG PO TABS: ORAL_TABLET | ORAL | 0 refills | Status: DC

## 2016-07-23 NOTE — Telephone Encounter (Signed)
Approved: #60 x 0 

## 2016-07-23 NOTE — Telephone Encounter (Signed)
Last filled 06-24-16 #60Last OV 07-10-16 Next OV 01-07-17

## 2016-07-23 NOTE — Telephone Encounter (Signed)
Left refill on voice mail at pharmacy  

## 2016-08-07 ENCOUNTER — Ambulatory Visit: Payer: Commercial Managed Care - HMO | Admitting: Internal Medicine

## 2016-08-13 ENCOUNTER — Other Ambulatory Visit: Payer: Self-pay | Admitting: Internal Medicine

## 2016-08-20 ENCOUNTER — Other Ambulatory Visit: Payer: Self-pay

## 2016-08-20 MED ORDER — ALPRAZOLAM 1 MG PO TABS: ORAL_TABLET | ORAL | 0 refills | Status: DC

## 2016-08-20 NOTE — Telephone Encounter (Signed)
Left refill on voice mail at pharmacy  

## 2016-08-20 NOTE — Telephone Encounter (Signed)
Approved: #60 x 0 

## 2016-08-20 NOTE — Telephone Encounter (Signed)
Last filled 07-23-16 #60 Last OV 07-10-16 Next OV 01-07-17

## 2016-09-20 ENCOUNTER — Other Ambulatory Visit: Payer: Self-pay | Admitting: Internal Medicine

## 2016-09-23 ENCOUNTER — Other Ambulatory Visit: Payer: Self-pay

## 2016-09-23 MED ORDER — ALPRAZOLAM 1 MG PO TABS: ORAL_TABLET | ORAL | 0 refills | Status: DC

## 2016-09-23 NOTE — Telephone Encounter (Signed)
Left refill on voice mail at pharmacy  

## 2016-09-23 NOTE — Telephone Encounter (Signed)
Last filled 08-20-16 #60 Lst OV 07-10-16 Next OV 01-07-17

## 2016-09-23 NOTE — Telephone Encounter (Signed)
Approved: #60 x 0 

## 2016-10-14 ENCOUNTER — Other Ambulatory Visit: Payer: Self-pay | Admitting: Internal Medicine

## 2016-10-14 NOTE — Telephone Encounter (Signed)
Approved:okay #60 x 0 but he should not request refill again till the end of January (he is early)

## 2016-10-14 NOTE — Telephone Encounter (Signed)
Last filled 09-23-16 #60 Last OV 07-10-16 Next OV 01-07-17 Pharmacy says pt want to pickup on the 21st.

## 2016-10-14 NOTE — Telephone Encounter (Signed)
Left refill on voice mail at pharmacy  

## 2016-11-08 ENCOUNTER — Ambulatory Visit (INDEPENDENT_AMBULATORY_CARE_PROVIDER_SITE_OTHER): Payer: Self-pay | Admitting: Internal Medicine

## 2016-11-08 ENCOUNTER — Encounter: Payer: Self-pay | Admitting: Internal Medicine

## 2016-11-08 VITALS — BP 130/86 | HR 104 | Temp 97.6°F | Resp 18 | Wt 186.0 lb

## 2016-11-08 DIAGNOSIS — J0100 Acute maxillary sinusitis, unspecified: Secondary | ICD-10-CM

## 2016-11-08 MED ORDER — AMOXICILLIN 500 MG PO TABS: 1000.0000 mg | ORAL_TABLET | Freq: Two times a day (BID) | ORAL | 0 refills | Status: AC

## 2016-11-08 NOTE — Progress Notes (Signed)
Subjective:    Patient ID: Patrick [name], male    DOB: [birth year], 81 y.o.   MRN: [id number]  HPI Here due to respiratory illness  Having head congestion Coughing up some phlegm--not too bad Headache Nasty nasal drainage with blowing nose No fever, shakes or chills No SOB Some sore throat due to the drainage No ear pain Started week okay  Tried coricidin HPB--- raised his sugars though (up to 170's)  Current Outpatient Prescriptions on File Prior to Visit  Medication Sig Dispense Refill  . ACCU-CHEK AVIVA PLUS test strip USE AS DIRECTED TO CHECK BLOOD SUGAR EVERY MORNING 50 each 11  . ALPRAZolam (XANAX) 1 MG tablet TAKE 1 TABLET BY MOUTH 2 TIMES A DAY AS NEEDED 60 tablet 0  . Ascorbic Acid (VITAMIN C) 1000 MG tablet Take 1,000 mg by mouth daily.    Marland Kitchen aspirin 81 MG tablet Take 81 mg by mouth daily.      . cholecalciferol (VITAMIN D) 1000 units tablet Take 1,000 Units by mouth daily.    Marland Kitchen desonide (DESOWEN) 0.05 % lotion APPLY TO AFFECTED AREA ON FACE TWICE DAILY 118 mL 0  . fish oil-omega-3 fatty acids 1000 MG capsule Take 2 g by mouth daily.      Marland Kitchen glimepiride (AMARYL) 4 MG tablet TAKE 1 TABLET BY MOUTH TWICE A DAY FOR DIABETES 60 tablet 11  . latanoprost (XALATAN) 0.005 % ophthalmic solution Place 1 drop into both eyes Daily.    Marland Kitchen lisinopril (PRINIVIL,ZESTRIL) 40 MG tablet TAKE 1 TABLET BY MOUTH EVERY MORNING FORBLOOD PRESSURE 30 tablet 11  . metFORMIN (GLUCOPHAGE) 1000 MG tablet TAKE 1 TABLET BY MOUTH BEFORE BREAKFAST AND 1 TABLET BEFORE DINNER, AND TAKE 1/2TABLET AT BEDTIME FOR DIABETES 75 tablet 11  . ranitidine (ZANTAC) 150 MG tablet Take 150 mg by mouth 2 (two) times daily. For acid reflux symptoms     . verapamil (CALAN-SR) 240 MG CR tablet TAKE 1 TABLET BY MOUTH EVERY MORNING FORBLOOD PRESSURE 30 tablet 3  . vitamin E 400 UNIT capsule Take 400 Units by mouth daily.     No current facility-administered medications on file prior to visit.     Allergies  Allergen  Reactions  . Gabapentin Other (See Comments)    Dizziness, lightheaded, nausea    Past Medical History:  Diagnosis Date  . Anxiety   . Diabetes mellitus   . GERD (gastroesophageal reflux disease)   . Hyperlipidemia   . Hypertension   . Irregular heartbeat    Dr. (his) stated that it was "nothing to worry about."  . Peptic ulcer disease   . Renal cyst   . Rosacea     Past Surgical History:  Procedure Laterality Date  . CHOLECYSTECTOMY    . KNEE ARTHROSCOPY     left  . PARTIAL GASTRECTOMY  1974    Family History  Problem Relation Age of Onset  . Cancer Brother     prostate  . Colon cancer Brother   . Cancer Sister   . Cancer Brother     prostate cancer  . Cancer Brother     prostate    Social History   Social History  . Marital status: Married    Spouse name: N/A  . Number of children: 2  . Years of education: N/A   Occupational History  . RETIRED, electrician Retired   Social History Main Topics  . Smoking status: Never Smoker  . Smokeless tobacco: Never Used  . Alcohol use  No  . Drug use: Unknown  . Sexual activity: Not on file   Other Topics Concern  . Not on file   Social History Narrative   Now has living will   Son is health care POA   Would accept resuscitation attempts   No tube feeds if cognitively unaware   Review of Systems  No rash No vomiting or diarrhea Appetite is okay---he cut back some and weight back to his previous     Objective:   Physical Exam  Constitutional: He appears well-nourished. No distress.  HENT:  No sinus tenderness Moderate nasal inflammation TMs fine (?retraction on left) No sig pharyngeal injection  Neck: No thyromegaly present.  Pulmonary/Chest: Effort normal and breath sounds normal. No respiratory distress. He has no wheezes. He has no rales.  Lymphadenopathy:    He has no cervical adenopathy.  Skin: No rash noted.          Assessment & Plan:

## 2016-11-08 NOTE — Patient Instructions (Signed)
Please start the antibiotic if you are worsening over the next few days, instead of improving.

## 2016-11-08 NOTE — Progress Notes (Signed)
Pre visit review using our clinic review tool, if applicable. No additional management support is needed unless otherwise documented below in the visit note. 

## 2016-11-08 NOTE — Assessment & Plan Note (Signed)
Still may just be viral Discussed supportive care Start amoxil if worsens

## 2016-11-19 ENCOUNTER — Other Ambulatory Visit: Payer: Self-pay | Admitting: Internal Medicine

## 2016-11-19 DIAGNOSIS — H401131 Primary open-angle glaucoma, bilateral, mild stage: Secondary | ICD-10-CM | POA: Diagnosis not present

## 2016-11-19 MED ORDER — ONETOUCH ULTRA SYSTEM W/DEVICE KIT: 1.0000 | PACK | Freq: Once | 0 refills | Status: AC

## 2016-11-19 MED ORDER — ONETOUCH DELICA LANCETS 33G MISC: 1.0000 [IU] | 3 refills | Status: DC | PRN

## 2016-11-19 MED ORDER — GLUCOSE BLOOD VI STRP: ORAL_STRIP | 12 refills | Status: DC

## 2016-11-19 MED ORDER — ONETOUCH DELICA LANCING DEV MISC: 1.0000 | Freq: Once | 0 refills | Status: AC

## 2016-11-26 ENCOUNTER — Other Ambulatory Visit: Payer: Self-pay | Admitting: Internal Medicine

## 2016-11-27 NOTE — Telephone Encounter (Signed)
Last filled 10-23-16 #60 Last regular OV 07-10-16 Next OV 01-07-17

## 2016-11-27 NOTE — Telephone Encounter (Signed)
Approved: #60 x 0 

## 2016-11-27 NOTE — Telephone Encounter (Signed)
Left refill on voice mail at pharmacy  

## 2016-12-06 ENCOUNTER — Other Ambulatory Visit: Payer: Self-pay | Admitting: Internal Medicine

## 2016-12-17 ENCOUNTER — Other Ambulatory Visit: Payer: Self-pay | Admitting: Internal Medicine

## 2016-12-17 NOTE — Telephone Encounter (Signed)
He is too early for this  Find out what is going on

## 2016-12-17 NOTE — Telephone Encounter (Signed)
Spoke to pt. He said he has he has 10 left which is 5 days worth. He said he will check with Sherren Mocha at the pharmacy to see if he did not receive the total 60 that was sent in. He said he does not take extra. Cannot account for 5 days early.

## 2016-12-17 NOTE — Telephone Encounter (Signed)
Last filled 11-27-16 #60 Last OV Acute 11-13-16 Next OV 01-07-17

## 2016-12-25 ENCOUNTER — Other Ambulatory Visit: Payer: Self-pay | Admitting: Internal Medicine

## 2016-12-25 NOTE — Telephone Encounter (Signed)
Approved: #60 x 0 

## 2016-12-25 NOTE — Telephone Encounter (Signed)
Last f/u 06/2016-CPE

## 2016-12-25 NOTE — Telephone Encounter (Signed)
Rx called in to requested pharmacy 

## 2017-01-07 ENCOUNTER — Ambulatory Visit: Payer: Commercial Managed Care - HMO | Admitting: Internal Medicine

## 2017-01-15 ENCOUNTER — Other Ambulatory Visit: Payer: Self-pay | Admitting: Internal Medicine

## 2017-01-17 ENCOUNTER — Encounter (INDEPENDENT_AMBULATORY_CARE_PROVIDER_SITE_OTHER): Payer: Self-pay

## 2017-01-17 ENCOUNTER — Ambulatory Visit (INDEPENDENT_AMBULATORY_CARE_PROVIDER_SITE_OTHER): Payer: PPO | Admitting: Internal Medicine

## 2017-01-17 ENCOUNTER — Encounter: Payer: Self-pay | Admitting: Internal Medicine

## 2017-01-17 VITALS — BP 122/82 | HR 69 | Temp 97.5°F | Wt 189.0 lb

## 2017-01-17 DIAGNOSIS — F39 Unspecified mood [affective] disorder: Secondary | ICD-10-CM | POA: Diagnosis not present

## 2017-01-17 DIAGNOSIS — E1149 Type 2 diabetes mellitus with other diabetic neurological complication: Secondary | ICD-10-CM

## 2017-01-17 DIAGNOSIS — I10 Essential (primary) hypertension: Secondary | ICD-10-CM

## 2017-01-17 DIAGNOSIS — S069X0S Unspecified intracranial injury without loss of consciousness, sequela: Secondary | ICD-10-CM

## 2017-01-17 LAB — LIPID PANEL
CHOL/HDL RATIO: 5
Cholesterol: 185 mg/dL (ref 0–200)
HDL: 37.8 mg/dL — ABNORMAL LOW (ref >39.00)
LDL CALC: 109 mg/dL — AB (ref 0–99)
NonHDL: 147.28
Triglycerides: 191 mg/dL — ABNORMAL HIGH (ref 0.0–149.0)
VLDL: 38.2 mg/dL (ref 0.0–40.0)

## 2017-01-17 LAB — COMPREHENSIVE METABOLIC PANEL
ALT: 21 U/L (ref 0–53)
AST: 17 U/L (ref 0–37)
Albumin: 4.4 g/dL (ref 3.5–5.2)
Alkaline Phosphatase: 46 U/L (ref 39–117)
BUN: 24 mg/dL — ABNORMAL HIGH (ref 6–23)
CALCIUM: 9.9 mg/dL (ref 8.4–10.5)
CHLORIDE: 103 meq/L (ref 96–112)
CO2: 27 meq/L (ref 19–32)
CREATININE: 1.08 mg/dL (ref 0.40–1.50)
GFR: 70.21 mL/min (ref >60.00)
Glucose, Bld: 179 mg/dL — ABNORMAL HIGH (ref 70–99)
POTASSIUM: 5.3 meq/L — AB (ref 3.5–5.1)
SODIUM: 140 meq/L (ref 135–145)
Total Bilirubin: 0.5 mg/dL (ref 0.2–1.2)
Total Protein: 7.2 g/dL (ref 6.0–8.3)

## 2017-01-17 LAB — CBC WITH DIFFERENTIAL/PLATELET
Basophils Absolute: 0 10*3/uL (ref 0.0–0.1)
Basophils Relative: 0.3 % (ref 0.0–3.0)
EOS ABS: 0.1 10*3/uL (ref 0.0–0.7)
Eosinophils Relative: 2.1 % (ref 0.0–5.0)
HEMATOCRIT: 44.8 % (ref 39.0–52.0)
HEMOGLOBIN: 15 g/dL (ref 13.0–17.0)
LYMPHS PCT: 44.5 % (ref 12.0–46.0)
Lymphs Abs: 2.7 10*3/uL (ref 0.7–4.0)
MCHC: 33.5 g/dL (ref 30.0–36.0)
MCV: 94.6 fl (ref 78.0–100.0)
MONOS PCT: 7.9 % (ref 3.0–12.0)
Monocytes Absolute: 0.5 10*3/uL (ref 0.1–1.0)
NEUTROS ABS: 2.7 10*3/uL (ref 1.4–7.7)
Neutrophils Relative %: 45.2 % (ref 43.0–77.0)
PLATELETS: 254 10*3/uL (ref 150.0–400.0)
RBC: 4.74 Mil/uL (ref 4.22–5.81)
RDW: 13.3 % (ref 11.5–15.5)
WBC: 6 10*3/uL (ref 4.0–10.5)

## 2017-01-17 LAB — T4, FREE: Free T4: 0.83 ng/dL (ref 0.60–1.60)

## 2017-01-17 LAB — HEMOGLOBIN A1C: HEMOGLOBIN A1C: 7.5 % — AB (ref 4.6–6.5)

## 2017-01-17 NOTE — Assessment & Plan Note (Signed)
Mild anxiety/panic as long as he takes the alprazolam Lost 3 brothers in past year

## 2017-01-17 NOTE — Progress Notes (Signed)
Subjective:    Patient ID: Patrick [name], male    DOB: 26-Sep-1936, 81 y.o.   MRN: [id number]  HPI Here for follow up of diabetes and other chronic health conditions  Doing fairly well Having trouble with legs and feet--- "give out on me when I try to work" Feels weak after working for an hour or so--- sits and rests, and then can go back to work No chest pain---other than occasional acid reflux flares (better with gaviscon) No SOB  Mild memory issues persists Since the head injury Trouble with names and numbers Still with some anxiety at times--- needs the alprazolam to ward off panic attacks (this has happened since the head injury--30 foot fall onto cement with head) Still can't drive on interstate much---"makes me drunk" Takes the alprazolam regularly--can't tolerate stopping  Checks sugars Just got new meter--- running 10 higher than the other Usually 150-160 No hypoglycemic reactions Mild pain and numbness of feet persist  Current Outpatient Prescriptions on File Prior to Visit  Medication Sig Dispense Refill  . ALPRAZolam (XANAX) 1 MG tablet TAKE 1 TABLET BY MOUTH TWICE A DAY AS NEEDED 60 tablet 0  . Ascorbic Acid (VITAMIN C) 1000 MG tablet Take 1,000 mg by mouth daily.    Marland Kitchen aspirin 81 MG tablet Take 81 mg by mouth daily.      . cholecalciferol (VITAMIN D) 1000 units tablet Take 1,000 Units by mouth daily.    Marland Kitchen desonide (DESOWEN) 0.05 % lotion APPLY TO AFFECTED AREA ON FACE TWICE DAILY 118 mL 0  . fish oil-omega-3 fatty acids 1000 MG capsule Take 2 g by mouth daily.      Marland Kitchen glimepiride (AMARYL) 4 MG tablet TAKE 1 TABLET BY MOUTH TWICE A DAY FOR DIABETES 60 tablet 11  . glucose blood (ONE TOUCH ULTRA TEST) test strip Use to obtain blood sugar. E11.49 100 each 12  . latanoprost (XALATAN) 0.005 % ophthalmic solution Place 1 drop into both eyes Daily.    Marland Kitchen lisinopril (PRINIVIL,ZESTRIL) 40 MG tablet TAKE 1 TABLET BY MOUTH EVERY MORNING FORBLOOD PRESSURE 30 tablet 11  .  metFORMIN (GLUCOPHAGE) 1000 MG tablet TAKE 1 TABLET BY MOUTH BEFORE BREAKFAST AND 1 TABLET BEFORE DINNER, AND TAKE 1/2TABLET AT BEDTIME FOR DIABETES 75 tablet 11  . ONETOUCH DELICA LANCETS 73X MISC 1 Units by Does not apply route as needed. Use one to obtain blood sugar. Dx  E11.49 100 each 3  . ranitidine (ZANTAC) 150 MG tablet Take 150 mg by mouth 2 (two) times daily. For acid reflux symptoms     . verapamil (CALAN-SR) 240 MG CR tablet TAKE 1 TABLET BY MOUTH EVERY MORNING FORBLOOD PRESSURE 30 tablet 5  . vitamin E 400 UNIT capsule Take 400 Units by mouth daily.     No current facility-administered medications on file prior to visit.     Allergies  Allergen Reactions  . Gabapentin Other (See Comments)    Dizziness, lightheaded, nausea    Past Medical History:  Diagnosis Date  . Anxiety   . Diabetes mellitus   . GERD (gastroesophageal reflux disease)   . Hyperlipidemia   . Hypertension   . Irregular heartbeat    Dr. (his) stated that it was "nothing to worry about."  . Peptic ulcer disease   . Renal cyst   . Rosacea     Past Surgical History:  Procedure Laterality Date  . CHOLECYSTECTOMY    . KNEE ARTHROSCOPY     left  . PARTIAL  GASTRECTOMY  1974    Family History  Problem Relation Age of Onset  . Cancer Brother     prostate  . Colon cancer Brother   . Cancer Sister   . Cancer Brother     prostate cancer  . Cancer Brother     prostate    Social History   Social History  . Marital status: Married    Spouse name: N/A  . Number of children: 2  . Years of education: N/A   Occupational History  . RETIRED, electrician Retired   Social History Main Topics  . Smoking status: Never Smoker  . Smokeless tobacco: Never Used  . Alcohol use No  . Drug use: Unknown  . Sexual activity: Not on file   Other Topics Concern  . Not on file   Social History Narrative   Now has living will   Son is health care POA   Would accept resuscitation attempts   No tube  feeds if cognitively unaware   Review of Systems  Sleeps fairly well-- awakens 4AM, even if he goes to sleep at 11PM Enjoys nap at times Appetite is okay Weight is stable Bowels are fine    Objective:   Physical Exam  Constitutional: He appears well-nourished. No distress.  Neck: No thyromegaly present.  Cardiovascular: Normal rate, regular rhythm, normal heart sounds and intact distal pulses.  Exam reveals no gallop.   No murmur heard. Pulmonary/Chest: Effort normal and breath sounds normal. No respiratory distress. He has no wheezes. He has no rales.  Abdominal: Soft. There is no tenderness.  Musculoskeletal:  Trace ankle edema  Lymphadenopathy:    He has no cervical adenopathy.  Skin: No rash noted.  No foot lesions  Psychiatric: He has a normal mood and affect. His behavior is normal.          Assessment & Plan:

## 2017-01-17 NOTE — Assessment & Plan Note (Signed)
He feels he is running some higher Goal under 8%--action if much higher Mild neuropathy--no Rx needed Giving out in legs is probably stamina--discussed exercise

## 2017-01-17 NOTE — Assessment & Plan Note (Signed)
BP Readings from Last 3 Encounters:  01/17/17 122/82  11/08/16 130/86  07/10/16 138/86   Good control

## 2017-01-17 NOTE — Progress Notes (Signed)
Pre visit review using our clinic review tool, if applicable. No additional management support is needed unless otherwise documented below in the visit note. 

## 2017-01-17 NOTE — Assessment & Plan Note (Signed)
Mild memory issues which are stable Gets panic at times unless takes the alprazolam (since fall)

## 2017-01-23 ENCOUNTER — Other Ambulatory Visit: Payer: Self-pay | Admitting: Internal Medicine

## 2017-01-23 NOTE — Telephone Encounter (Signed)
Last filled 12-25-16 #60 Last OV 01-17-17 No Future OV  Forward to Allie Bossier in Dr Alla German absence

## 2017-01-23 NOTE — Telephone Encounter (Signed)
Left refill on voice mail at pharmacy  

## 2017-01-29 DIAGNOSIS — L820 Inflamed seborrheic keratosis: Secondary | ICD-10-CM | POA: Diagnosis not present

## 2017-01-29 DIAGNOSIS — L570 Actinic keratosis: Secondary | ICD-10-CM | POA: Diagnosis not present

## 2017-01-29 DIAGNOSIS — L719 Rosacea, unspecified: Secondary | ICD-10-CM | POA: Diagnosis not present

## 2017-01-29 DIAGNOSIS — L578 Other skin changes due to chronic exposure to nonionizing radiation: Secondary | ICD-10-CM | POA: Diagnosis not present

## 2017-01-29 DIAGNOSIS — D1800 Hemangioma unspecified site: Secondary | ICD-10-CM | POA: Diagnosis not present

## 2017-01-29 DIAGNOSIS — D485 Neoplasm of uncertain behavior of skin: Secondary | ICD-10-CM | POA: Diagnosis not present

## 2017-01-29 DIAGNOSIS — Z85828 Personal history of other malignant neoplasm of skin: Secondary | ICD-10-CM | POA: Diagnosis not present

## 2017-01-29 DIAGNOSIS — L821 Other seborrheic keratosis: Secondary | ICD-10-CM | POA: Diagnosis not present

## 2017-01-29 DIAGNOSIS — Z1283 Encounter for screening for malignant neoplasm of skin: Secondary | ICD-10-CM | POA: Diagnosis not present

## 2017-01-29 DIAGNOSIS — L304 Erythema intertrigo: Secondary | ICD-10-CM | POA: Diagnosis not present

## 2017-01-29 DIAGNOSIS — D229 Melanocytic nevi, unspecified: Secondary | ICD-10-CM | POA: Diagnosis not present

## 2017-02-06 ENCOUNTER — Other Ambulatory Visit: Payer: Self-pay | Admitting: *Deleted

## 2017-02-06 MED ORDER — KETOCONAZOLE 2 % EX CREA: TOPICAL_CREAM | CUTANEOUS | 1 refills | Status: DC

## 2017-02-06 NOTE — Telephone Encounter (Signed)
Okay #60 gm x 1 Apply bid prn

## 2017-02-06 NOTE — Telephone Encounter (Signed)
Rx sent to requested pharmacy

## 2017-02-06 NOTE — Telephone Encounter (Signed)
Spoke to [employer] at [facility] who states that pt has yeast infection in his groin. He is wanting nizoral 2% cream. Pt had expired Rx and is no longer on pts med list. pls advise

## 2017-02-17 ENCOUNTER — Other Ambulatory Visit: Payer: Self-pay | Admitting: Internal Medicine

## 2017-03-03 ENCOUNTER — Other Ambulatory Visit: Payer: Self-pay | Admitting: Internal Medicine

## 2017-03-03 NOTE — Telephone Encounter (Signed)
R called to pharmacy as instructed.

## 2017-03-03 NOTE — Telephone Encounter (Signed)
Approved: #60 x 0 

## 2017-03-03 NOTE — Telephone Encounter (Signed)
Received refill electronically Last refill 01/23/17 #60 Last office visit 01/17/17

## 2017-03-25 ENCOUNTER — Other Ambulatory Visit: Payer: Self-pay | Admitting: Internal Medicine

## 2017-03-31 ENCOUNTER — Telehealth: Payer: Self-pay | Admitting: Internal Medicine

## 2017-03-31 NOTE — Telephone Encounter (Signed)
PLEASE NOTE: All timestamps contained within this report are represented as [place] Standard Time. CONFIDENTIALTY NOTICE: This fax transmission is intended only for the addressee. It contains information that is legally privileged, confidential or otherwise protected from use or disclosure. If you are not the intended recipient, you are strictly prohibited from reviewing, disclosing, copying using or disseminating any of this information or taking any action in reliance on or regarding this information. If you have received this fax in error, please notify us immediately by telephone so that we can arrange for its return to [place]. Phone: [phone], Toll-Free: [phone], Fax: [phone] Page: 1 of 2 Call Id: [id number] [place] Patient Name: Patrick [name] Gender: Male DOB: Mar 04, 1936 Age: 81 Y 4 M 8 D Return Phone Number: [phone] (Primary) City/State/Zip: [place] [address] Client [facility] - Client Client Site [place] - Day Physician Viviana Simpler - MD Who Is Calling Patient / Member / Family / Caregiver Call Type Triage / Clinical Relationship To Patient Self Return Phone Number [phone] (Primary) Chief Complaint Dizziness Reason for Call Symptomatic / Request for [place] states for the last few days he has been dizzy and has a blood sugar reading of 250. Appointment Disposition EMR Appointment Scheduled Info pasted into Epic Yes Nurse Assessment Nurse: Genoveva Ill, RN, Lattie Haw Date/Time (Eastern Time): 03/31/2017 3:10:31 PM Confirm and document reason for call. If symptomatic, describe symptoms. ---Caller states for the last few days he has been dizzy and has high blood sugar readings; BS 252; took metformin 1000mg  just now and a little early; also takes glimipiride 4mg  Does the PT have any chronic  conditions? (i.e. diabetes, asthma, etc.) ---Yes List chronic conditions. ---diabetes type 2, HTN Guidelines Guideline Title Affirmed Question Dizziness - Lightheadedness [1] MODERATE dizziness (e.g., interferes with normal activities) AND [2] has NOT been evaluated by physician for this (Exception: dizziness caused by heat exposure, sudden standing, or poor fluid intake) Diabetes - High Blood Sugar [1] Blood glucose > 240 mg/dl (13 mmol/l) AND [2] does not use insulin (e.g., not insulin-dependent; most people with type 2 diabetes) Disp. Time [name] Time) Disposition Final User 03/31/2017 3:23:44 PM Home Care Yes [place]ress, RN, Lattie Haw Referrals REFERRED TO PCP OFFICE Care Advice Given Per Guideline SEE PHYSICIAN WITHIN 24 HOURS: * IF OFFICE WILL BE OPEN: You need to be seen within the next 24 hours. Call your doctor when the office opens, and make an appointment. FLUIDS: Drink several glasses of fruit juice, other clear fluids or water. This will improve hydration and blood glucose. If the weather is hot, make sure the fluids are cold. REST: Lie down with feet elevated for 1 hour. This will improve circulation and increase blood flow to the brain. CALL BACK IF: * Passes out (faints) * You become worse. CARE ADVICE given per Dizziness (Adult) guideline. HIGH BLOOD SUGAR (HYPERGLYCEMIA): * Definition: Fasting blood glucose over 140 mg/dL (7.5 mmol/l) or random blood glucose over 200 mg/dL (11 mmol/l). * Symptoms of mild hyperglycemia: Frequent urination, increased thirst, fatigue, blurred vision. * Symptoms of severe hyperglycemia: Confusion and coma. * Contributing factors: Non-adherence to medicines, nonPLEASE NOTE: All timestamps contained within this report are represented as [place] Standard Time. CONFIDENTIALTY NOTICE: This fax transmission is intended only for the addressee. It contains information that is legally privileged, confidential or otherwise protected from use or disclosure.  If you are not the  intended recipient, you are strictly prohibited from reviewing, disclosing, copying using or disseminating any of this information or taking any action in reliance on or regarding this information. If you have received this fax in error, please notify us immediately by telephone so that we can arrange for its return to [place]. Phone: [phone], Toll-Free: [phone], Fax: [phone] Page: 2 of 2 Call Id: [id number] Care Advice Given Per Guideline adherence to diet, and infection. * Drink at least one glass (8 oz; 240 ml) of water per hour for the next 4 hours (Reason: adequate hydration will reduce hyperglycemia). * Generally, you should try to drink 6-8 glasses of water each day. * Measure your blood glucose before breakfast and before going to bed. * Record the results and show them to your doctor at your next office visit. MEASURE AND RECORD YOUR BLOOD GLUCOSE: * You and your doctor should decide on what your blood glucose goals should be. Typical goals for most non-pregnant adults who perform daily finger-stick blood testing at home shown below. * Preprandial (before meal): 80-130 mg/dL (4.4-7.2 mmol/l) * Your blood sugar continues to get above 240 mg/dl (13 mmol/l). * Your blood sugar continues to be higher than the glucose goals your doctor set for you.It has been longer than 6 months since you had an Hemoglobin A1C test. CALL BACK IF: * Blood glucose over 300 mg/dL (16.5 mmol/l), two or more times in a row. * Vomiting lasting over 4 hours or unable to drink any fluids * Rapid breathing occurs * You become worse or have more questions. CARE ADVICE given per Diabetes - High Blood Sugar (Adult) guideline. Comments User: Margaretha Sheffield, RN Date/Time [name] Time): 03/31/2017 3:30:50 PM appt scheduled 04/01/17 at 59 with Dr. Silvio Pate

## 2017-03-31 NOTE — Telephone Encounter (Signed)
Pt has appt with Dr Silvio Pate 04/01/17 at 11:15.

## 2017-03-31 NOTE — Telephone Encounter (Signed)
Will evaluate at tomorrow's appt

## 2017-03-31 NOTE — Telephone Encounter (Signed)
Patient Name: Patrick [name]  DOB: 1936-05-08    Initial Comment Caller states for the last few days he has been dizzy and has a blood sugar reading of 250.    Nurse Assessment  Nurse: Genoveva Ill, RN, Lattie Haw Date/Time (Eastern Time): 03/31/2017 3:10:31 PM  Confirm and document reason for call. If symptomatic, describe symptoms. ---Caller states for the last few days he has been dizzy and has high blood sugar readings; BS 252; took metformin 1000mg  just now and a little early; also takes glimipiride 4mg   Does the patient have any new or worsening symptoms? ---Yes  Will a triage be completed? ---Yes  Related visit to physician within the last 2 weeks? ---No  Does the PT have any chronic conditions? (i.e. diabetes, asthma, etc.) ---Yes  List chronic conditions. ---diabetes type 2, HTN  Is this a behavioral health or substance abuse call? ---No     Guidelines    Guideline Title Affirmed Question Affirmed Notes  Dizziness - Lightheadedness [1] MODERATE dizziness (e.g., interferes with normal activities) AND [2] has NOT been evaluated by physician for this (Exception: dizziness caused by heat exposure, sudden standing, or poor fluid intake)   Diabetes - High Blood Sugar [1] Blood glucose > 240 mg/dl (13 mmol/l) AND [2] does not use insulin (e.g., not insulin-dependent; most people with type 2 diabetes)    Final Disposition User   Home Care Bur[place], RN, Lattie Haw    Comments  appt scheduled 04/01/17 at 11 with Dr. Silvio Pate   Referrals  REFERRED TO PCP OFFICE   Disagree/Comply: Comply    Disagree/Comply: Comply

## 2017-04-01 ENCOUNTER — Encounter: Payer: Self-pay | Admitting: Internal Medicine

## 2017-04-01 ENCOUNTER — Other Ambulatory Visit: Payer: Self-pay | Admitting: Internal Medicine

## 2017-04-01 ENCOUNTER — Ambulatory Visit (INDEPENDENT_AMBULATORY_CARE_PROVIDER_SITE_OTHER): Payer: PPO | Admitting: Internal Medicine

## 2017-04-01 VITALS — BP 126/88 | Temp 97.5°F | Wt 189.0 lb

## 2017-04-01 DIAGNOSIS — R42 Dizziness and giddiness: Secondary | ICD-10-CM | POA: Diagnosis not present

## 2017-04-01 DIAGNOSIS — E1149 Type 2 diabetes mellitus with other diabetic neurological complication: Secondary | ICD-10-CM

## 2017-04-01 NOTE — Assessment & Plan Note (Signed)
May be related to working out in the heat Transient edema is better now Discussed avoiding working in afternoon No action

## 2017-04-01 NOTE — Telephone Encounter (Signed)
Left refill on voice mail at pharmacy  

## 2017-04-01 NOTE — Telephone Encounter (Signed)
Last filled 03-03-17 #60 Last OV 01-17-17 Next OV Today  No UDS

## 2017-04-01 NOTE — Progress Notes (Signed)
Subjective:    Patient ID: Patrick [name], male    DOB: Feb 21, 1936, 81 y.o.   MRN: [id number]  HPI Here with concerns about his diabetes control  Sugars a bit higher 170-180 fasting (usually 150-160) 252 yesterday in afternoon---felt dizziness after working in yard (was shortly postprandial) Reviewed his numbers---overall looks okay (only a few over 160)  For the past week, has noticed some ankle edema----better now Just one episode of dizziness--- out working in heat "woozy" in afternoons at times (after lunch) No syncope Occ sharp chest pain--relates to "tension" and panic attacks No palpitations No SOB  Current Outpatient Prescriptions on File Prior to Visit  Medication Sig Dispense Refill  . Ascorbic Acid (VITAMIN C) 1000 MG tablet Take 1,000 mg by mouth daily.    Marland Kitchen aspirin 81 MG tablet Take 81 mg by mouth daily.      . cholecalciferol (VITAMIN D) 1000 units tablet Take 1,000 Units by mouth daily.    Marland Kitchen desonide (DESOWEN) 0.05 % lotion APPLY TO AFFECTED AREA ON FACE TWICE DAILY 118 mL 0  . fish oil-omega-3 fatty acids 1000 MG capsule Take 2 g by mouth daily.      Marland Kitchen glimepiride (AMARYL) 4 MG tablet TAKE 1 TABLET BY MOUTH TWICE A DAY FOR DIABETES 180 tablet 3  . glucose blood (ONE TOUCH ULTRA TEST) test strip Use to obtain blood sugar. E11.49 100 each 12  . ketoconazole (NIZORAL) 2 % cream Apply twice daily as needed 60 g 1  . latanoprost (XALATAN) 0.005 % ophthalmic solution Place 1 drop into both eyes Daily.    Marland Kitchen lisinopril (PRINIVIL,ZESTRIL) 40 MG tablet TAKE 1 TABLET BY MOUTH EVERY MORNING FORBLOOD PRESSURE 90 tablet 3  . metFORMIN (GLUCOPHAGE) 1000 MG tablet TAKE 1 TABLET BY MOUTH BEFORE BREAKFAST AND TAKE 1 TABLET BEFORE DINNER, AND TAKE 1/2 TABLET AT BEDTIME FOR DIABETES 75 tablet 3  . ONETOUCH DELICA LANCETS 78I MISC 1 Units by Does not apply route as needed. Use one to obtain blood sugar. Dx  E11.49 100 each 3  . ranitidine (ZANTAC) 150 MG tablet Take 150 mg by mouth  2 (two) times daily. For acid reflux symptoms     . verapamil (CALAN-SR) 240 MG CR tablet TAKE 1 TABLET BY MOUTH EVERY MORNING FORBLOOD PRESSURE 30 tablet 5  . vitamin E 400 UNIT capsule Take 400 Units by mouth daily.     No current facility-administered medications on file prior to visit.     Allergies  Allergen Reactions  . Gabapentin Other (See Comments)    Dizziness, lightheaded, nausea    Past Medical History:  Diagnosis Date  . Anxiety   . Diabetes mellitus   . GERD (gastroesophageal reflux disease)   . Hyperlipidemia   . Hypertension   . Irregular heartbeat    Dr. (his) stated that it was "nothing to worry about."  . Peptic ulcer disease   . Renal cyst   . Rosacea     Past Surgical History:  Procedure Laterality Date  . CHOLECYSTECTOMY    . KNEE ARTHROSCOPY     left  . PARTIAL GASTRECTOMY  1974    Family History  Problem Relation Age of Onset  . Cancer Brother        prostate  . Colon cancer Brother   . Kidney failure Brother   . Cancer Sister   . Heart disease Brother   . Cancer Brother        prostate cancer  . Cancer  Brother        prostate    Social History   Social History  . Marital status: Married    Spouse name: N/A  . Number of children: 2  . Years of education: N/A   Occupational History  . RETIRED, electrician Retired   Social History Main Topics  . Smoking status: Never Smoker  . Smokeless tobacco: Never Used  . Alcohol use No  . Drug use: Unknown  . Sexual activity: Not on file   Other Topics Concern  . Not on file   Social History Narrative   Now has living will   Son is health care POA   Would accept resuscitation attempts   No tube feeds if cognitively unaware   Review of Systems Appetite is okay Weight is stable Sleeps okay in general----never much more than 6 hours    Objective:   Physical Exam  Constitutional: He appears well-developed. No distress.  Neck: No thyromegaly present.  Cardiovascular: Normal  rate, regular rhythm, normal heart sounds and intact distal pulses.  Exam reveals no gallop.   No murmur heard. Pulmonary/Chest: Effort normal and breath sounds normal. No respiratory distress. He has no wheezes. He has no rales.  Musculoskeletal: He exhibits no edema.  Lymphadenopathy:    He has no cervical adenopathy.  Psychiatric: He has a normal mood and affect. His behavior is normal.          Assessment & Plan:

## 2017-04-01 NOTE — Telephone Encounter (Signed)
Approved: #60 x 0 

## 2017-04-01 NOTE — Assessment & Plan Note (Signed)
Reviewed his numbers Doesn't seem to be a big issue Will check A1c in 3 weeks

## 2017-04-15 ENCOUNTER — Other Ambulatory Visit: Payer: Self-pay | Admitting: Internal Medicine

## 2017-04-22 ENCOUNTER — Other Ambulatory Visit (INDEPENDENT_AMBULATORY_CARE_PROVIDER_SITE_OTHER): Payer: PPO

## 2017-04-22 DIAGNOSIS — E1149 Type 2 diabetes mellitus with other diabetic neurological complication: Secondary | ICD-10-CM

## 2017-04-22 LAB — HEMOGLOBIN A1C: Hgb A1c MFr Bld: 7.8 % — ABNORMAL HIGH (ref 4.6–6.5)

## 2017-04-24 ENCOUNTER — Other Ambulatory Visit: Payer: Self-pay | Admitting: Internal Medicine

## 2017-04-29 ENCOUNTER — Other Ambulatory Visit: Payer: Self-pay | Admitting: Internal Medicine

## 2017-04-29 NOTE — Telephone Encounter (Signed)
Approved: #60 x 0 

## 2017-04-29 NOTE — Telephone Encounter (Signed)
Last filled 04-01-17 #60 Last Ov 04-01-17 Next Ov 08-22-17

## 2017-04-29 NOTE — Telephone Encounter (Signed)
Left refill on voice mail at pharmacy  

## 2017-05-21 DIAGNOSIS — H401131 Primary open-angle glaucoma, bilateral, mild stage: Secondary | ICD-10-CM | POA: Diagnosis not present

## 2017-05-21 LAB — HM DIABETES EYE EXAM

## 2017-05-28 ENCOUNTER — Other Ambulatory Visit: Payer: Self-pay | Admitting: Internal Medicine

## 2017-05-28 NOTE — Telephone Encounter (Signed)
Left refill on voice mail at pharmacy  

## 2017-05-28 NOTE — Telephone Encounter (Signed)
Last filled 04-29-17 #60 Last OV 04-01-17 Next OV 08-22-17

## 2017-05-28 NOTE — Telephone Encounter (Signed)
Approved: #60 x 0 

## 2017-06-02 ENCOUNTER — Encounter: Payer: Self-pay | Admitting: Internal Medicine

## 2017-06-16 ENCOUNTER — Other Ambulatory Visit: Payer: Self-pay | Admitting: Internal Medicine

## 2017-07-01 ENCOUNTER — Other Ambulatory Visit: Payer: Self-pay | Admitting: Internal Medicine

## 2017-07-01 NOTE — Telephone Encounter (Signed)
Approved: #60 x 0 

## 2017-07-01 NOTE — Telephone Encounter (Signed)
Last filled 05-28-17 #60 Last OV 04-01-17 Next OV 08-22-17

## 2017-07-01 NOTE — Telephone Encounter (Signed)
Left refill on voice mail at pharmacy  

## 2017-07-07 ENCOUNTER — Other Ambulatory Visit: Payer: Self-pay | Admitting: Internal Medicine

## 2017-07-28 ENCOUNTER — Other Ambulatory Visit: Payer: Self-pay | Admitting: Internal Medicine

## 2017-07-28 NOTE — Telephone Encounter (Signed)
Left refill on voice mail at pharmacy  

## 2017-07-28 NOTE — Telephone Encounter (Signed)
Last filled 07-01-17 #60 Last OV 04-01-17 Next OV 08-22-17

## 2017-07-28 NOTE — Telephone Encounter (Signed)
Approved: #60 x 0 

## 2017-07-31 DIAGNOSIS — L820 Inflamed seborrheic keratosis: Secondary | ICD-10-CM | POA: Diagnosis not present

## 2017-07-31 DIAGNOSIS — D485 Neoplasm of uncertain behavior of skin: Secondary | ICD-10-CM | POA: Diagnosis not present

## 2017-07-31 DIAGNOSIS — L570 Actinic keratosis: Secondary | ICD-10-CM | POA: Diagnosis not present

## 2017-07-31 DIAGNOSIS — L578 Other skin changes due to chronic exposure to nonionizing radiation: Secondary | ICD-10-CM | POA: Diagnosis not present

## 2017-07-31 DIAGNOSIS — Z85828 Personal history of other malignant neoplasm of skin: Secondary | ICD-10-CM | POA: Diagnosis not present

## 2017-07-31 DIAGNOSIS — L821 Other seborrheic keratosis: Secondary | ICD-10-CM | POA: Diagnosis not present

## 2017-07-31 DIAGNOSIS — D225 Melanocytic nevi of trunk: Secondary | ICD-10-CM | POA: Diagnosis not present

## 2017-08-22 ENCOUNTER — Ambulatory Visit (INDEPENDENT_AMBULATORY_CARE_PROVIDER_SITE_OTHER): Payer: PPO | Admitting: Internal Medicine

## 2017-08-22 ENCOUNTER — Encounter: Payer: Self-pay | Admitting: Internal Medicine

## 2017-08-22 VITALS — BP 134/90 | HR 76 | Temp 97.4°F | Ht 66.5 in | Wt 191.0 lb

## 2017-08-22 DIAGNOSIS — Z0000 Encounter for general adult medical examination without abnormal findings: Secondary | ICD-10-CM | POA: Diagnosis not present

## 2017-08-22 DIAGNOSIS — Z7189 Other specified counseling: Secondary | ICD-10-CM

## 2017-08-22 DIAGNOSIS — S069X0S Unspecified intracranial injury without loss of consciousness, sequela: Secondary | ICD-10-CM | POA: Diagnosis not present

## 2017-08-22 DIAGNOSIS — Z23 Encounter for immunization: Secondary | ICD-10-CM | POA: Diagnosis not present

## 2017-08-22 DIAGNOSIS — E1149 Type 2 diabetes mellitus with other diabetic neurological complication: Secondary | ICD-10-CM

## 2017-08-22 DIAGNOSIS — F39 Unspecified mood [affective] disorder: Secondary | ICD-10-CM | POA: Diagnosis not present

## 2017-08-22 DIAGNOSIS — I10 Essential (primary) hypertension: Secondary | ICD-10-CM | POA: Diagnosis not present

## 2017-08-22 LAB — HM DIABETES FOOT EXAM

## 2017-08-22 LAB — HEMOGLOBIN A1C: HEMOGLOBIN A1C: 7.7 % — AB (ref 4.6–6.5)

## 2017-08-22 NOTE — Assessment & Plan Note (Signed)
Ongoing vertigo and cognitive impairment which appear stable

## 2017-08-22 NOTE — Assessment & Plan Note (Signed)
I have personally reviewed the Medicare Annual Wellness questionnaire and have noted 1. The patient's medical and social history 2. Their use of alcohol, tobacco or illicit drugs 3. Their current medications and supplements 4. The patient's functional ability including ADL's, fall risks, home safety risks and hearing or visual             impairment. 5. Diet and physical activities 6. Evidence for depression or mood disorders  The patients weight, height, BMI and visual acuity have been recorded in the chart I have made referrals, counseling and provided education to the patient based review of the above and I have provided the pt with a written personalized care plan for preventive services.  I have provided you with a copy of your personalized plan for preventive services. Please take the time to review along with your updated medication list.  Will update pneumovax and give flu vaccine No cancer screening due to age Discussed working harder on fitness

## 2017-08-22 NOTE — Assessment & Plan Note (Signed)
BP Readings from Last 3 Encounters:  08/22/17 134/90  04/01/17 126/88  01/17/17 122/82   On ACEI  Control acceptable

## 2017-08-22 NOTE — Addendum Note (Signed)
Addended by: Pilar Grammes on: 08/22/2017 11:05 AM   Modules accepted: Orders

## 2017-08-22 NOTE — Progress Notes (Signed)
Subjective:    Patient ID: Patrick [name], male    DOB: 02/12/1936, 81 y.o.   MRN: [id number]  HPI Here for Medicare wellness and follow up of chronic health conditions Reviewed form and advanced directives Reviewed other doctors No tobacco or alcohol Tries to exercise regularly (twice a week) Did get [employer] screening--apparently passed everything Ongoing mild memory and mood issues Drives locally --gets vertigo if on interstate Independent with instrumental ADLs Vision is okay.  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 Most under 160 No hypoglycemia  Occasional heartburn Uses the zantac prn only (2-3 per month) No dysphagia  Current Outpatient Prescriptions on File Prior to Visit  Medication Sig Dispense Refill  . ALPRAZolam (XANAX) 1 MG tablet TAKE 1 TABLET BY MOUTH TWICE (2) DAILY AS NEEDED 60 tablet 0  . Ascorbic Acid (VITAMIN C) 1000 MG tablet Take 1,000 mg by mouth daily.    Marland Kitchen aspirin 81 MG tablet Take 81 mg by mouth daily.      . cholecalciferol (VITAMIN D) 1000 units tablet Take 1,000 Units by mouth daily.    Marland Kitchen desonide (DESOWEN) 0.05 % lotion APPLY TO AFFECTED AREA ON FACE TWICE DAILY 118 mL 0  . fish oil-omega-3 fatty acids 1000 MG capsule Take 2 g by mouth daily.      Marland Kitchen glimepiride (AMARYL) 4 MG tablet TAKE 1 TABLET BY MOUTH TWICE A  DAY FOR DIABETES 180 tablet 3  . glucose blood (ONE TOUCH ULTRA TEST) test strip Use to test blood sugar twice daily 100 each 2  . ketoconazole (NIZORAL) 2 % cream Apply twice daily as needed 60 g 1  . latanoprost (XALATAN) 0.005 % ophthalmic solution Place 1 drop into both eyes Daily.    Marland Kitchen lisinopril (PRINIVIL,ZESTRIL) 40 MG tablet TAKE 1 TABLET BY MOUTH EVERY MORNING FORBLOOD PRESSURE 90 tablet 3  . metFORMIN (GLUCOPHAGE) 1000 MG tablet TAKE 1 TABLET BY MOUTH BEFORE BREAKFAST AND TAKE 1 TAB BEFORE DINNER,AND 1/2 TAB AT BEDTIME FOR DIABETES 75 tablet 11  . ONETOUCH DELICA LANCETS 16B MISC 1 Units by Does not apply route as needed. Use one to obtain blood sugar. Dx  E11.49 100 each 3  . ranitidine (ZANTAC) 150 MG tablet Take 150 mg by mouth 2 (two) times daily. For acid reflux symptoms     . verapamil (CALAN-SR) 240 MG CR tablet TAKE 1 TABLET BY MOUTH EVERY MORNING FORBLOOD PRESSURE 90 tablet 0  . vitamin E 400 UNIT capsule Take 400 Units by mouth daily.     No current facility-administered medications on file prior to visit.     Allergies  Allergen Reactions  . Gabapentin Other (See Comments)    Dizziness, lightheaded, nausea    Past Medical History:  Diagnosis Date  . Anxiety   . Diabetes mellitus   . GERD (gastroesophageal reflux disease)   .  Hyperlipidemia   . Hypertension   . Irregular heartbeat    Dr. (his) stated that it was "nothing to worry about."  . Peptic ulcer disease   . Renal cyst   . Rosacea     Past Surgical History:  Procedure Laterality Date  . CHOLECYSTECTOMY    . KNEE ARTHROSCOPY     left  . PARTIAL GASTRECTOMY  1974    Family History  Problem Relation Age of Onset  . Cancer Brother        prostate  . Colon cancer Brother   . Kidney failure Brother   . Cancer Sister   . Heart disease Brother   . Cancer Brother        prostate cancer  . Cancer Brother        prostate    Social History   Social History  . Marital status: Widowed    Spouse  name: N/A  . Number of children: 2  . Years of education: N/A   Occupational History  . RETIRED, electrician Retired   Social History Main Topics  . Smoking status: Never Smoker  . Smokeless tobacco: Never Used  . Alcohol use No  . Drug use: Unknown  . Sexual activity: Not on file   Other Topics Concern  . Not on file   Social History Narrative   Now has living will   Son is health care POA--no alternate   Would accept resuscitation attempts   No tube feeds if cognitively unaware   Review of Systems Appetite is good Weight up 3# Sleeps well Wears seat belt Teeth okay--keeps up with dentist. Bowels are fine. No blood Voids fine. Nocturia x 1 usually. Increased frequency in day--but not a problem and stream is okay No active skin problems. Keeps up with derm yearly No sig back or joint pain    Objective:   Physical Exam  Constitutional: He is oriented to person, place, and time. He appears well-developed. No distress.  HENT:  Mouth/Throat: Oropharynx is clear and moist. No oropharyngeal exudate.  Neck: No thyromegaly present.  Cardiovascular: Normal rate, regular rhythm, normal heart sounds and intact distal pulses.  Exam reveals no gallop.   No murmur heard. Pulmonary/Chest: Effort normal and breath sounds normal. No respiratory distress. He has no wheezes. He has no rales.  Abdominal: Soft. He exhibits no distension. There is no tenderness. There is no rebound and no guarding.  Musculoskeletal: He exhibits no edema or tenderness.  Lymphadenopathy:    He has no cervical adenopathy.  Neurological: He is alert and oriented to person, place, and time.  Knows month and day--but trouble with year--then got it right President-- "Daisy Floro, ??" [phone] [id number] Recall 3/3  Decreased sensation in feet  Skin: No rash noted.  No foot lesions  Psychiatric: He has a normal mood and affect. His behavior is normal.          Assessment & Plan:

## 2017-08-22 NOTE — Assessment & Plan Note (Signed)
Still seems to have acceptable control Will check A1c

## 2017-08-22 NOTE — Assessment & Plan Note (Signed)
Anxiety and dysthymia which are generally mild Does okay with the alprazolam

## 2017-08-22 NOTE — Assessment & Plan Note (Signed)
See social history 

## 2017-08-28 ENCOUNTER — Other Ambulatory Visit: Payer: Self-pay | Admitting: Internal Medicine

## 2017-08-28 NOTE — Telephone Encounter (Signed)
Left refill on voice mail at pharmacy  

## 2017-08-28 NOTE — Telephone Encounter (Signed)
Last filled 07-28-17 #60 Last OV 08-22-17 No Future OV

## 2017-08-28 NOTE — Telephone Encounter (Signed)
Approved: #60 x 0 

## 2017-09-17 ENCOUNTER — Other Ambulatory Visit: Payer: Self-pay | Admitting: Internal Medicine

## 2017-09-22 ENCOUNTER — Other Ambulatory Visit: Payer: Self-pay | Admitting: Internal Medicine

## 2017-09-24 ENCOUNTER — Other Ambulatory Visit: Payer: Self-pay | Admitting: Internal Medicine

## 2017-09-24 NOTE — Telephone Encounter (Signed)
Left refill on voice mail at pharmacy  

## 2017-09-24 NOTE — Telephone Encounter (Signed)
Approved: #60 x 0 

## 2017-09-24 NOTE — Telephone Encounter (Signed)
Last Rx 08/28/2017. Last OV 07/2017. pls advise

## 2017-10-08 ENCOUNTER — Other Ambulatory Visit: Payer: Self-pay | Admitting: Internal Medicine

## 2017-10-23 ENCOUNTER — Other Ambulatory Visit: Payer: Self-pay | Admitting: Internal Medicine

## 2017-10-23 NOTE — Telephone Encounter (Signed)
Last filled 09-24-17 #60 Last OV 08-22-17  No Future OV

## 2017-10-23 NOTE — Telephone Encounter (Signed)
Approved: #60 x 0 

## 2017-10-23 NOTE — Telephone Encounter (Signed)
Left refill on voice mail at pharmacy  

## 2017-11-04 ENCOUNTER — Other Ambulatory Visit: Payer: Self-pay | Admitting: Internal Medicine

## 2017-11-17 DIAGNOSIS — H401131 Primary open-angle glaucoma, bilateral, mild stage: Secondary | ICD-10-CM | POA: Diagnosis not present

## 2017-11-24 DIAGNOSIS — H401131 Primary open-angle glaucoma, bilateral, mild stage: Secondary | ICD-10-CM | POA: Diagnosis not present

## 2017-11-26 ENCOUNTER — Other Ambulatory Visit: Payer: Self-pay | Admitting: Internal Medicine

## 2017-11-26 NOTE — Telephone Encounter (Signed)
Last filled 10-23-17 #60 Last OV 08-22-17 No Future OV

## 2017-12-25 ENCOUNTER — Other Ambulatory Visit: Payer: Self-pay | Admitting: Internal Medicine

## 2017-12-25 NOTE — Telephone Encounter (Signed)
Last filled: 11/26/17 Last OV: 08/22/17 No future OV

## 2018-01-19 ENCOUNTER — Other Ambulatory Visit: Payer: Self-pay | Admitting: Internal Medicine

## 2018-01-19 NOTE — Telephone Encounter (Signed)
Last filled 12-25-17 #60 Last OV 08-22-17 No Future OV  (Pt always asks for it early to make sure the pharmacy has it in stock)

## 2018-02-02 ENCOUNTER — Other Ambulatory Visit: Payer: Self-pay | Admitting: Internal Medicine

## 2018-02-20 ENCOUNTER — Ambulatory Visit (INDEPENDENT_AMBULATORY_CARE_PROVIDER_SITE_OTHER): Payer: PPO | Admitting: Internal Medicine

## 2018-02-20 ENCOUNTER — Encounter: Payer: Self-pay | Admitting: Internal Medicine

## 2018-02-20 VITALS — BP 114/60 | HR 75 | Temp 97.3°F | Ht 67.0 in | Wt 187.0 lb

## 2018-02-20 DIAGNOSIS — S069X0S Unspecified intracranial injury without loss of consciousness, sequela: Secondary | ICD-10-CM | POA: Diagnosis not present

## 2018-02-20 DIAGNOSIS — R251 Tremor, unspecified: Secondary | ICD-10-CM | POA: Diagnosis not present

## 2018-02-20 DIAGNOSIS — F39 Unspecified mood [affective] disorder: Secondary | ICD-10-CM | POA: Diagnosis not present

## 2018-02-20 DIAGNOSIS — E1149 Type 2 diabetes mellitus with other diabetic neurological complication: Secondary | ICD-10-CM | POA: Diagnosis not present

## 2018-02-20 DIAGNOSIS — I10 Essential (primary) hypertension: Secondary | ICD-10-CM

## 2018-02-20 LAB — COMPREHENSIVE METABOLIC PANEL
ALBUMIN: 4.3 g/dL (ref 3.5–5.2)
ALT: 14 U/L (ref 0–53)
AST: 14 U/L (ref 0–37)
Alkaline Phosphatase: 48 U/L (ref 39–117)
BUN: 21 mg/dL (ref 6–23)
CHLORIDE: 102 meq/L (ref 96–112)
CO2: 26 mEq/L (ref 19–32)
Calcium: 9.1 mg/dL (ref 8.4–10.5)
Creatinine, Ser: 1.08 mg/dL (ref 0.40–1.50)
GFR: 70.01 mL/min (ref >60.00)
GLUCOSE: 166 mg/dL — AB (ref 70–99)
POTASSIUM: 5.3 meq/L — AB (ref 3.5–5.1)
Sodium: 139 mEq/L (ref 135–145)
Total Bilirubin: 0.5 mg/dL (ref 0.2–1.2)
Total Protein: 7.1 g/dL (ref 6.0–8.3)

## 2018-02-20 LAB — LIPID PANEL
CHOL/HDL RATIO: 4
Cholesterol: 158 mg/dL (ref 0–200)
HDL: 42.8 mg/dL (ref >39.00)
LDL Cholesterol: 87 mg/dL (ref 0–99)
NONHDL: 115.57
Triglycerides: 141 mg/dL (ref 0.0–149.0)
VLDL: 28.2 mg/dL (ref 0.0–40.0)

## 2018-02-20 LAB — HEMOGLOBIN A1C: HEMOGLOBIN A1C: 7.7 % — AB (ref 4.6–6.5)

## 2018-02-20 LAB — CBC
HCT: 43.7 % (ref 39.0–52.0)
Hemoglobin: 15 g/dL (ref 13.0–17.0)
MCHC: 34.3 g/dL (ref 30.0–36.0)
MCV: 94.5 fl (ref 78.0–100.0)
PLATELETS: 251 10*3/uL (ref 150.0–400.0)
RBC: 4.63 Mil/uL (ref 4.22–5.81)
RDW: 13.1 % (ref 11.5–15.5)
WBC: 5.7 10*3/uL (ref 4.0–10.5)

## 2018-02-20 MED ORDER — ALPRAZOLAM 1 MG PO TABS: ORAL_TABLET | ORAL | 0 refills | Status: DC

## 2018-02-20 NOTE — Assessment & Plan Note (Signed)
BP Readings from Last 3 Encounters:  02/20/18 114/60  08/22/17 134/90  04/01/17 126/88   Good control

## 2018-02-20 NOTE — Patient Instructions (Signed)
Please try support (compression) socks when you are doing yard work, Social research officer, government

## 2018-02-20 NOTE — Assessment & Plan Note (Signed)
Ongoing dizziness/vertigo and cognitive issues (stable) since concussion

## 2018-02-20 NOTE — Assessment & Plan Note (Signed)
Control has slipped with his caregiving responsibilities Goal still <8%, but no action unless close to or over 9% Discussed trying to get some help with sister (sister is resistant)

## 2018-02-20 NOTE — Assessment & Plan Note (Signed)
Anxiety and panic since concussion Uses the xanax

## 2018-02-20 NOTE — Assessment & Plan Note (Signed)
Hands shake some after doing a lot of yard work Brothers had the same thing Observe only for now

## 2018-02-20 NOTE — Progress Notes (Signed)
Subjective:    Patient ID: ADEM COSTLOW, male    DOB: 1936-09-09, 82 y.o.   MRN: [id number]  HPI Here for follow up of diabetes and other chronic medical conditions  "I have been having a hard time" Son had heart attack--this scared him and he went and got lifeline screening (all okay) Sister fell and needs help---he has to go to help her every morning Brother with shingles--having a tough time--so not able to help with their sister  Sugars running Checks every morning 98-190s but most under 160 Still careful with eating Still does yard work and busy with care for sister Feet swell when he is doing mowing---improve over night with elevation Still with leg aching--medication didn't help No chest pain or SOB  Some dizziness ---vertigo Ongoing memory issues---no progression--since concussion after falling off roof years ago Still with anxiety and panic at times since then Still needs the xanax regularly  Current Outpatient Medications on File Prior to Visit  Medication Sig Dispense Refill  . ALPRAZolam (XANAX) 1 MG tablet TAKE 1 TABLET BY MOUTH TWICE (2) DAILY AS NEEDED 60 tablet 0  . Ascorbic Acid (VITAMIN C) 1000 MG tablet Take 1,000 mg by mouth daily.    Marland Kitchen aspirin 81 MG tablet Take 81 mg by mouth daily.      . cholecalciferol (VITAMIN D) 1000 units tablet Take 1,000 Units by mouth daily.    Marland Kitchen desonide (DESOWEN) 0.05 % lotion APPLY TO AFFECTED AREA ON FACE TWICE DAILY 118 mL 0  . fish oil-omega-3 fatty acids 1000 MG capsule Take 2 g by mouth daily.      Marland Kitchen glimepiride (AMARYL) 4 MG tablet TAKE 1 TABLET BY MOUTH TWICE A DAY FOR DIABETES 180 tablet 3  . glucose blood (ONE TOUCH ULTRA TEST) test strip USE TO TEST BLOOD SUGAR TWICE DAILY 100 each 3  . ketoconazole (NIZORAL) 2 % cream Apply twice daily as needed 60 g 1  . latanoprost (XALATAN) 0.005 % ophthalmic solution Place 1 drop into both eyes Daily.    Marland Kitchen lisinopril (PRINIVIL,ZESTRIL) 40 MG tablet TAKE 1 TABLET BY MOUTH  EVERY MORNING FORBLOOD PRESSURE 90 tablet 3  . metFORMIN (GLUCOPHAGE) 1000 MG tablet TAKE 1 TABLET BY MOUTH BEFORE BREAKFAST AND TAKE 1 TAB BEFORE DINNER,AND 1/2 TAB AT BEDTIME FOR DIABETES 75 tablet 11  . ONETOUCH DELICA LANCETS 63O MISC 1 Units by Does not apply route as needed. Use one to obtain blood sugar. Dx  E11.49 100 each 3  . ranitidine (ZANTAC) 150 MG tablet Take 150 mg by mouth 2 (two) times daily. For acid reflux symptoms     . verapamil (CALAN-SR) 240 MG CR tablet TAKE 1 TABLET BY MOUTH EVERY MORNING FORBLOOD PRESSURE 90 tablet 3  . vitamin E 400 UNIT capsule Take 400 Units by mouth daily.     No current facility-administered medications on file prior to visit.     Allergies  Allergen Reactions  . Gabapentin Other (See Comments)    Dizziness, lightheaded, nausea    Past Medical History:  Diagnosis Date  . Anxiety   . Diabetes mellitus   . GERD (gastroesophageal reflux disease)   . Hyperlipidemia   . Hypertension   . Irregular heartbeat    Dr. (his) stated that it was "nothing to worry about."  . Peptic ulcer disease   . Renal cyst   . Rosacea     Past Surgical History:  Procedure Laterality Date  . CHOLECYSTECTOMY    .  KNEE ARTHROSCOPY     left  . PARTIAL GASTRECTOMY  1974    Family History  Problem Relation Age of Onset  . Cancer Brother        prostate  . Colon cancer Brother   . Kidney failure Brother   . Cancer Sister   . Heart disease Brother   . Cancer Brother        prostate cancer  . Cancer Brother        prostate    Social History   Socioeconomic History  . Marital status: Widowed    Spouse name: Not on file  . Number of children: 2  . Years of education: Not on file  . Highest education level: Not on file  Occupational History  . Occupation: RETIRED, Programmer, systems: RETIRED  Social Needs  . Financial resource strain: Not on file  . Food insecurity:    Worry: Not on file    Inability: Not on file  . Transportation  needs:    Medical: Not on file    Non-medical: Not on file  Tobacco Use  . Smoking status: Never Smoker  . Smokeless tobacco: Never Used  Substance and Sexual Activity  . Alcohol use: No  . Drug use: Not on file  . Sexual activity: Not on file  Lifestyle  . Physical activity:    Days per week: Not on file    Minutes per session: Not on file  . Stress: Not on file  Relationships  . Social connections:    Talks on phone: Not on file    Gets together: Not on file    Attends religious service: Not on file    Active member of club or organization: Not on file    Attends meetings of clubs or organizations: Not on file    Relationship status: Not on file  . Intimate partner violence:    Fear of current or ex partner: Not on file    Emotionally abused: Not on file    Physically abused: Not on file    Forced sexual activity: Not on file  Other Topics Concern  . Not on file  Social History Narrative   Now has living will   Son is health care POA--no alternate   Would accept resuscitation attempts   No tube feeds if cognitively unaware    Review of Systems Not sleeping great with the stress Appetite is okay Bowels are okay    Objective:   Physical Exam  Constitutional: He appears well-developed. No distress.  Neck: No thyromegaly present.  Cardiovascular: Normal rate, regular rhythm, normal heart sounds and intact distal pulses. Exam reveals no gallop.  No murmur heard. Pulmonary/Chest: Effort normal and breath sounds normal. No respiratory distress. He has no wheezes. He has no rales.  Musculoskeletal: He exhibits no tenderness.  Slight right foot edema only  Lymphadenopathy:    He has no cervical adenopathy.  Skin:  No foot lesions  Psychiatric: He has a normal mood and affect. His behavior is normal.          Assessment & Plan:

## 2018-03-19 ENCOUNTER — Other Ambulatory Visit: Payer: Self-pay | Admitting: Internal Medicine

## 2018-03-25 ENCOUNTER — Other Ambulatory Visit: Payer: Self-pay | Admitting: Internal Medicine

## 2018-03-25 NOTE — Telephone Encounter (Signed)
Last filled 02-20-18 #60 Last OV 02-20-18 Next OV 09-04-18

## 2018-04-21 ENCOUNTER — Other Ambulatory Visit: Payer: Self-pay | Admitting: Internal Medicine

## 2018-04-21 NOTE — Telephone Encounter (Signed)
Last filled 03-25-18 #60 Last OV 02-20-18 Next OV 09-04-18  Pt always asks a few days early to make sure the pharmacy has it in stock.

## 2018-04-22 ENCOUNTER — Other Ambulatory Visit: Payer: Self-pay | Admitting: Internal Medicine

## 2018-04-29 ENCOUNTER — Other Ambulatory Visit: Payer: Self-pay | Admitting: Internal Medicine

## 2018-05-19 ENCOUNTER — Other Ambulatory Visit: Payer: Self-pay | Admitting: Internal Medicine

## 2018-05-19 NOTE — Telephone Encounter (Signed)
Electronic refill request Last office visit 02/20/18 last refill 04/22/18 #60 Upcoming appointment 09/04/18

## 2018-05-19 NOTE — Telephone Encounter (Signed)
Refilled times one in pcp absence  

## 2018-06-02 DIAGNOSIS — E119 Type 2 diabetes mellitus without complications: Secondary | ICD-10-CM | POA: Diagnosis not present

## 2018-06-02 LAB — HM DIABETES EYE EXAM

## 2018-06-04 ENCOUNTER — Encounter: Payer: Self-pay | Admitting: Internal Medicine

## 2018-06-16 ENCOUNTER — Other Ambulatory Visit: Payer: Self-pay | Admitting: Family Medicine

## 2018-06-16 NOTE — Telephone Encounter (Signed)
Last filled 05-19-18 #60 Last OV 02-20-18 Next OV 09-04-18

## 2018-07-16 ENCOUNTER — Other Ambulatory Visit: Payer: Self-pay | Admitting: Internal Medicine

## 2018-07-16 NOTE — Telephone Encounter (Signed)
Last filled 06-16-18 #60 Last OV 02-20-18 Next OV 09-04-18

## 2018-07-28 ENCOUNTER — Other Ambulatory Visit: Payer: Self-pay | Admitting: Internal Medicine

## 2018-08-18 ENCOUNTER — Other Ambulatory Visit: Payer: Self-pay | Admitting: Internal Medicine

## 2018-08-18 NOTE — Telephone Encounter (Signed)
Last filled 07-16-18 #60 Last OV 02-20-18 Next OV 09-04-18

## 2018-08-21 ENCOUNTER — Encounter: Payer: PPO | Admitting: Internal Medicine

## 2018-08-25 ENCOUNTER — Encounter: Payer: PPO | Admitting: Internal Medicine

## 2018-09-04 ENCOUNTER — Ambulatory Visit (INDEPENDENT_AMBULATORY_CARE_PROVIDER_SITE_OTHER): Payer: PPO | Admitting: Internal Medicine

## 2018-09-04 ENCOUNTER — Encounter: Payer: Self-pay | Admitting: Internal Medicine

## 2018-09-04 VITALS — BP 118/76 | HR 81 | Temp 97.7°F | Ht 66.75 in | Wt 189.0 lb

## 2018-09-04 DIAGNOSIS — Z0000 Encounter for general adult medical examination without abnormal findings: Secondary | ICD-10-CM

## 2018-09-04 DIAGNOSIS — K219 Gastro-esophageal reflux disease without esophagitis: Secondary | ICD-10-CM | POA: Diagnosis not present

## 2018-09-04 DIAGNOSIS — Z7189 Other specified counseling: Secondary | ICD-10-CM

## 2018-09-04 DIAGNOSIS — S069X0S Unspecified intracranial injury without loss of consciousness, sequela: Secondary | ICD-10-CM

## 2018-09-04 DIAGNOSIS — I10 Essential (primary) hypertension: Secondary | ICD-10-CM

## 2018-09-04 DIAGNOSIS — E1149 Type 2 diabetes mellitus with other diabetic neurological complication: Secondary | ICD-10-CM

## 2018-09-04 DIAGNOSIS — R300 Dysuria: Secondary | ICD-10-CM | POA: Insufficient documentation

## 2018-09-04 LAB — POC URINALSYSI DIPSTICK (AUTOMATED)
BILIRUBIN UA: NEGATIVE
Blood, UA: NEGATIVE
Glucose, UA: NEGATIVE
Ketones, UA: NEGATIVE
LEUKOCYTES UA: NEGATIVE
NITRITE UA: NEGATIVE
PH UA: 5.5 (ref 5.0–8.0)
PROTEIN UA: NEGATIVE
Spec Grav, UA: >=1.03 — AB (ref 1.010–1.025)
Urobilinogen, UA: 0.2 E.U./dL

## 2018-09-04 LAB — POCT GLYCOSYLATED HEMOGLOBIN (HGB A1C): HEMOGLOBIN A1C: 7.3 % — AB (ref 4.0–5.6)

## 2018-09-04 LAB — HM DIABETES FOOT EXAM

## 2018-09-04 NOTE — Progress Notes (Signed)
Hearing Screening   125Hz  250Hz  500Hz  1000Hz  2000Hz  3000Hz  4000Hz  6000Hz  8000Hz   Right ear:   20 25 20   40    Left ear:   20 20 20  20     Vision Screening Comments: August 2019

## 2018-09-04 NOTE — Assessment & Plan Note (Signed)
Doing okay off regular Rx Would add pepcid if increased symptoms

## 2018-09-04 NOTE — Assessment & Plan Note (Addendum)
Seems to have good control Will check A1c Mild neuro sensory symptoms without sig pain Lab Results  Component Value Date   HGBA1C 7.3 (A) 09/04/2018   Good control No changes needed

## 2018-09-04 NOTE — Progress Notes (Signed)
Subjective:    Patient ID: Patrick [name], male    DOB: 08-25-1936, 82 y.o.   MRN: [id number]  HPI Here for Medicare wellness visit and follow up of chronic health conditions Reviewed advanced directives Reviewed other doctors---- [facility], Dr Danley Danker (needs new one), [facility] No alcohol or tobacco No hospitalizations this year No falls No depression or anhedonia Vision and hearing are okay Independent with instrumental ADLs Mild stable memory issues--mostly recall No regular exercise lately  Still gets swelling in feet if he is on his feet Persistent sore areas ---top of right foot and medial right calf He is using neosporin Still has "needles and pins" in both feet Not walking as much due to secondary swelling  Checking sugars every morning 91-144 --rarely higher No hypoglycemic reactions  Stopped zantac due to the recall Uses gaviscon prn if he gets heartburn--- 2-3 times per week No dysphagia  Still gets vertigo and panic symptoms at times Usually if in crowd, or loud noise [place] comes [place] This goes back to concussion from falling off building Xanax still helps  No chest pain--other than acid reflux No SOB No dizziness or syncope No palpitations Does give out a little quicker--- has to take break mowing  Current Outpatient Medications on File Prior to Visit  Medication Sig Dispense Refill  . ALPRAZolam (XANAX) 1 MG tablet TAKE 1 TABLET BY MOUTH TWICE A DAY AS NEEDED 60 tablet 0  . Ascorbic Acid (VITAMIN C) 1000 MG tablet Take 1,000 mg by mouth daily.    Marland Kitchen aspirin 81 MG tablet Take 81 mg by mouth daily.      . cholecalciferol (VITAMIN D) 1000 units tablet Take 1,000 Units by mouth daily.    Marland Kitchen desonide (DESOWEN) 0.05 % lotion APPLY TO AFFECTED AREA ON FACE TWICE DAILY 118 mL 0  . fish oil-omega-3 fatty acids 1000 MG capsule Take 2 g by mouth daily.      Marland Kitchen glimepiride (AMARYL) 4 MG tablet TAKE 1 TABLET  BY MOUTH TWICE A DAY FOR DIABETES 180 tablet 3  . glucose blood (ONE TOUCH ULTRA TEST) test strip USE TO TEST BLOOD SUGAR TWICE DAILY 100 each 3  . ketoconazole (NIZORAL) 2 % cream Apply twice daily as needed 60 g 1  . latanoprost (XALATAN) 0.005 % ophthalmic solution Place 1 drop into both eyes Daily.    Marland Kitchen lisinopril (PRINIVIL,ZESTRIL) 40 MG tablet TAKE 1 TABLET BY MOUTH EVERY MORNING FORBLOOD PRESSURE 90 tablet 3  . metFORMIN (GLUCOPHAGE) 1000 MG tablet TAKE 1 TABLET BY MOUTH BEFORE BREAKFAST AND TAKE 1 TAB BEFORE DINNER,AND 1/2 TAB AT BEDTIME FOR DIABETES 75 tablet 11  . ONETOUCH DELICA LANCETS 38H MISC 1 Units by Does not apply route as needed. Use one to obtain blood sugar. Dx  E11.49 100 each 3  . verapamil (CALAN-SR) 240 MG CR tablet TAKE 1 TABLET BY MOUTH EVERY MORNING FORBLOOD PRESSURE 90 tablet 3  . vitamin E 400 UNIT capsule Take 400 Units by mouth daily.     No current facility-administered medications on file prior to visit.     Allergies  Allergen Reactions  . Gabapentin Other (See Comments)    Dizziness, lightheaded, nausea    Past Medical History:  Diagnosis Date  . Anxiety   . Diabetes mellitus   . GERD (gastroesophageal reflux disease)   . Hyperlipidemia   . Hypertension   . Irregular heartbeat    Dr. (his) stated that  it was "nothing to worry about."  . Peptic ulcer disease   . Renal cyst   . Rosacea     Past Surgical History:  Procedure Laterality Date  . CHOLECYSTECTOMY    . KNEE ARTHROSCOPY     left  . PARTIAL GASTRECTOMY  1974    Family History  Problem Relation Age of Onset  . Cancer Brother        prostate  . Colon cancer Brother   . Kidney failure Brother   . Cancer Sister   . Heart disease Brother   . Cancer Brother        prostate cancer  . Cancer Brother        prostate    Social History   Socioeconomic History  . Marital status: Widowed    Spouse name: Not on file  . Number of children: 2  . Years of education: Not on file  .  Highest education level: Not on file  Occupational History  . Occupation: RETIRED, Programmer, systems: RETIRED  Social Needs  . Financial resource strain: Not on file  . Food insecurity:    Worry: Not on file    Inability: Not on file  . Transportation needs:    Medical: Not on file    Non-medical: Not on file  Tobacco Use  . Smoking status: Never Smoker  . Smokeless tobacco: Never Used  Substance and Sexual Activity  . Alcohol use: No  . Drug use: Not on file  . Sexual activity: Not on file  Lifestyle  . Physical activity:    Days per week: Not on file    Minutes per session: Not on file  . Stress: Not on file  Relationships  . Social connections:    Talks on phone: Not on file    Gets together: Not on file    Attends religious service: Not on file    Active member of club or organization: Not on file    Attends meetings of clubs or organizations: Not on file    Relationship status: Not on file  . Intimate partner violence:    Fear of current or ex partner: Not on file    Emotionally abused: Not on file    Physically abused: Not on file    Forced sexual activity: Not on file  Other Topics Concern  . Not on file  Social History Narrative   Now has living will   Son is health care POA--no alternate   Would accept resuscitation attempts   No tube feeds if cognitively unaware   Review of Systems Appetite is fine Weight is stable Sleeps fine Wears seat belt Teeth okay---- sees dentist No other skin problems --other than right foot/calf Bruises easily Bowels are fine---no blood Had brief period of increased urinary frequency and slight stinging---better after prune juice. Nocturia x 1. Flow okay. No blood     Objective:   Physical Exam  Constitutional: He is oriented to person, place, and time. He appears well-developed. No distress.  HENT:  Mouth/Throat: Oropharynx is clear and moist. No oropharyngeal exudate.  Neck: No thyromegaly present.    Cardiovascular: Normal rate, regular rhythm, normal heart sounds and intact distal pulses. Exam reveals no gallop.  No murmur heard. Respiratory: Effort normal and breath sounds normal. No respiratory distress. He has no wheezes. He has no rales.  GI: Soft. There is no tenderness.  Musculoskeletal: He exhibits no tenderness.  Trace edema  Lymphadenopathy:  He has no cervical adenopathy.  Neurological: He is alert and oriented to person, place, and time.  President--- "Daisy Floro, Georgiann Cocker, Johnson, the peanut man" [phone] [id number] Recall 2/3  Mildly decreased sensation in feet  Skin: No rash noted.  Discoloration without ulcer on 2 spots on right foot/calf Slight plantar callous No other foot lesions  Psychiatric: He has a normal mood and affect. His behavior is normal.           Assessment & Plan:

## 2018-09-04 NOTE — Assessment & Plan Note (Signed)
I have personally reviewed the Medicare Annual Wellness questionnaire and have noted 1. The patient's medical and social history 2. Their use of alcohol, tobacco or illicit drugs 3. Their current medications and supplements 4. The patient's functional ability including ADL's, fall risks, home safety risks and hearing or visual             impairment. 5. Diet and physical activities 6. Evidence for depression or mood disorders  The patients weight, height, BMI and visual acuity have been recorded in the chart I have made referrals, counseling and provided education to the patient based review of the above and I have provided the pt with a written personalized care plan for preventive services.  I have provided you with a copy of your personalized plan for preventive services. Please take the time to review along with your updated medication list.  Had flu vaccine and UTD on other vaccines No cancer screening at his age Discussed fitness

## 2018-09-04 NOTE — Assessment & Plan Note (Signed)
See social history 

## 2018-09-04 NOTE — Patient Instructions (Addendum)
If you are having more trouble with heartburn, start over the counter famotidine 20mg  twice a day. Please try compression socks---to see if you can walk again without them swelling.

## 2018-09-04 NOTE — Assessment & Plan Note (Signed)
Vertigo/panic since concussion Uses the alprazolam prn

## 2018-09-04 NOTE — Assessment & Plan Note (Signed)
BP Readings from Last 3 Encounters:  09/04/18 118/76  02/20/18 114/60  08/22/17 134/90   Good control

## 2018-09-04 NOTE — Assessment & Plan Note (Addendum)
Vague and mild Will check urinalysis  I suspect his symptoms are from concentration--discussed adequate fluids

## 2018-09-16 ENCOUNTER — Other Ambulatory Visit: Payer: Self-pay | Admitting: Internal Medicine

## 2018-09-16 NOTE — Telephone Encounter (Signed)
Last filled 08-18-18 #60 Last OV 09-04-18 Next OV 03-12-19 [place]

## 2018-09-22 ENCOUNTER — Other Ambulatory Visit: Payer: Self-pay | Admitting: Internal Medicine

## 2018-10-13 ENCOUNTER — Other Ambulatory Visit: Payer: Self-pay | Admitting: Internal Medicine

## 2018-10-13 NOTE — Telephone Encounter (Signed)
Last filled 09-16-18 #60 Last OV 09-04-18 Next OV 03-12-19 [place]  Pt always asks for it a few days early to make sure they have it in stock when he is ready for it.  Forwarding to [facility] in Dr Alla German absence

## 2018-10-20 ENCOUNTER — Other Ambulatory Visit: Payer: Self-pay | Admitting: Internal Medicine

## 2018-11-10 ENCOUNTER — Other Ambulatory Visit: Payer: Self-pay | Admitting: Internal Medicine

## 2018-11-11 NOTE — Telephone Encounter (Signed)
Xanax 1mg  Last filled 10-13-18 #60 Last OV 09-04-18 Next OV 03-12-19 [place]  Pt always asks for it a few days early to make sure they have it in stock when he is ready for it.

## 2018-11-17 ENCOUNTER — Encounter: Payer: Self-pay | Admitting: Internal Medicine

## 2018-11-17 ENCOUNTER — Ambulatory Visit (INDEPENDENT_AMBULATORY_CARE_PROVIDER_SITE_OTHER): Payer: PPO | Admitting: Internal Medicine

## 2018-11-17 VITALS — BP 122/70 | HR 81 | Temp 97.6°F | Ht 66.75 in | Wt 187.0 lb

## 2018-11-17 DIAGNOSIS — J0110 Acute frontal sinusitis, unspecified: Secondary | ICD-10-CM | POA: Diagnosis not present

## 2018-11-17 MED ORDER — AMOXICILLIN 500 MG PO TABS: 1000.0000 mg | ORAL_TABLET | Freq: Two times a day (BID) | ORAL | 0 refills | Status: AC

## 2018-11-17 NOTE — Assessment & Plan Note (Signed)
May still be viral Discussed supportive care If worsens in the next few days, start amoxil

## 2018-11-17 NOTE — Progress Notes (Signed)
Subjective:    Patient ID: Patrick [name], male    DOB: [birth year], 83 y.o.   MRN: [id number]  HPI Here due to respiratory illness  Sick for about a week Head and nasal congestion Post nasal drip with coughing that up Frontal pressure No fever No SOB Ears are stopped up---pop at times with jaw movement  Tried coricidin--not clearly helpful Cough more at night  Current Outpatient Medications on File Prior to Visit  Medication Sig Dispense Refill  . ALPRAZolam (XANAX) 1 MG tablet TAKE 1 TABLET BY MOUTH TWICE A DAY AS NEEDED 60 tablet 0  . Ascorbic Acid (VITAMIN C) 1000 MG tablet Take 1,000 mg by mouth daily.    Marland Kitchen aspirin 81 MG tablet Take 81 mg by mouth daily.      . cholecalciferol (VITAMIN D) 1000 units tablet Take 1,000 Units by mouth daily.    Marland Kitchen desonide (DESOWEN) 0.05 % lotion APPLY TO AFFECTED AREA ON FACE TWICE DAILY 118 mL 0  . fish oil-omega-3 fatty acids 1000 MG capsule Take 2 g by mouth daily.      Marland Kitchen glimepiride (AMARYL) 4 MG tablet TAKE 1 TABLET BY MOUTH TWICE A DAY FOR DIABETES 180 tablet 3  . glucose blood (ONE TOUCH ULTRA TEST) test strip USE TO TEST BLOOD SUGAR TWICE DAILY. Dx Code E11.49 100 each 3  . ketoconazole (NIZORAL) 2 % cream Apply twice daily as needed 60 g 1  . latanoprost (XALATAN) 0.005 % ophthalmic solution Place 1 drop into both eyes Daily.    Marland Kitchen lisinopril (PRINIVIL,ZESTRIL) 40 MG tablet TAKE 1 TABLET BY MOUTH EVERY MORNING FORBLOOD PRESSURE 90 tablet 3  . metFORMIN (GLUCOPHAGE) 1000 MG tablet TAKE 1 TABLET BY MOUTH BEFORE BREAKFAST AND TAKE 1 TAB BEFORE DINNER,AND 1/2 TAB AT BEDTIME FOR DIABETES 75 tablet 11  . ONETOUCH DELICA LANCETS 29U MISC 1 Units by Does not apply route as needed. Use one to obtain blood sugar. Dx  E11.49 100 each 3  . verapamil (CALAN-SR) 240 MG CR tablet TAKE 1 TABLET BY MOUTH EVERY MORNING FORBLOOD PRESSURE. 90 tablet 3  . vitamin E 400 UNIT capsule Take 400 Units by mouth daily.     No current facility-administered  medications on file prior to visit.     Allergies  Allergen Reactions  . Gabapentin Other (See Comments)    Dizziness, lightheaded, nausea    Past Medical History:  Diagnosis Date  . Anxiety   . Diabetes mellitus   . GERD (gastroesophageal reflux disease)   . Hyperlipidemia   . Hypertension   . Irregular heartbeat    Dr. (his) stated that it was "nothing to worry about."  . Peptic ulcer disease   . Renal cyst   . Rosacea     Past Surgical History:  Procedure Laterality Date  . CHOLECYSTECTOMY    . KNEE ARTHROSCOPY     left  . PARTIAL GASTRECTOMY  1974    Family History  Problem Relation Age of Onset  . Cancer Brother        prostate  . Colon cancer Brother   . Kidney failure Brother   . Cancer Sister   . Heart disease Brother   . Cancer Brother        prostate cancer  . Cancer Brother        prostate    Social History   Socioeconomic History  . Marital status: Widowed    Spouse name: Not on file  . Number of  children: 2  . Years of education: Not on file  . Highest education level: Not on file  Occupational History  . Occupation: RETIRED, Programmer, systems: RETIRED  Social Needs  . Financial resource strain: Not on file  . Food insecurity:    Worry: Not on file    Inability: Not on file  . Transportation needs:    Medical: Not on file    Non-medical: Not on file  Tobacco Use  . Smoking status: Never Smoker  . Smokeless tobacco: Never Used  Substance and Sexual Activity  . Alcohol use: No  . Drug use: Not on file  . Sexual activity: Not on file  Lifestyle  . Physical activity:    Days per week: Not on file    Minutes per session: Not on file  . Stress: Not on file  Relationships  . Social connections:    Talks on phone: Not on file    Gets together: Not on file    Attends religious service: Not on file    Active member of club or organization: Not on file    Attends meetings of clubs or organizations: Not on file    Relationship  status: Not on file  . Intimate partner violence:    Fear of current or ex partner: Not on file    Emotionally abused: Not on file    Physically abused: Not on file    Forced sexual activity: Not on file  Other Topics Concern  . Not on file  Social History Narrative   Now has living will   Son is health care POA--no alternate   Would accept resuscitation attempts   No tube feeds if cognitively unaware   Review of Systems No vomiting or diarrhea Eating okay    Objective:   Physical Exam  Constitutional: He appears well-developed. No distress.  HENT:  Mouth/Throat: Oropharynx is clear and moist. No oropharyngeal exudate.  Mild frontal tenderness Moderate nasal swelling TMs not inflamed--?slight retraction   Neck: No thyromegaly present.  Respiratory: Effort normal and breath sounds normal. No respiratory distress. He has no wheezes. He has no rales.  Lymphadenopathy:    He has no cervical adenopathy.           Assessment & Plan:

## 2018-11-17 NOTE — Patient Instructions (Signed)
Please start the antibiotic if you are worsening in the next couple of days.

## 2018-12-10 ENCOUNTER — Other Ambulatory Visit: Payer: Self-pay | Admitting: Internal Medicine

## 2018-12-15 ENCOUNTER — Other Ambulatory Visit: Payer: Self-pay | Admitting: Internal Medicine

## 2018-12-15 NOTE — Telephone Encounter (Signed)
Last filled 11-16-18 #60 Last OV 09-04-18 Next OV 03-12-19 [place]

## 2018-12-29 ENCOUNTER — Other Ambulatory Visit: Payer: Self-pay | Admitting: Internal Medicine

## 2019-01-18 ENCOUNTER — Other Ambulatory Visit: Payer: Self-pay | Admitting: Internal Medicine

## 2019-01-18 NOTE — Telephone Encounter (Signed)
Last filled 12-15-18 #60 Last OV Acute 11-17-18 Next OV 03-12-19 [place]

## 2019-02-16 ENCOUNTER — Other Ambulatory Visit: Payer: Self-pay | Admitting: Internal Medicine

## 2019-02-16 DIAGNOSIS — F39 Unspecified mood [affective] disorder: Secondary | ICD-10-CM

## 2019-02-16 NOTE — Telephone Encounter (Signed)
Pt requested Rx refill. Pt had refill on 01/18/2019, QT # 60, R 0. Sent to Dr. Silvio Pate to review.

## 2019-03-12 ENCOUNTER — Ambulatory Visit: Payer: PPO | Admitting: Internal Medicine

## 2019-03-12 ENCOUNTER — Ambulatory Visit (INDEPENDENT_AMBULATORY_CARE_PROVIDER_SITE_OTHER): Payer: PPO | Admitting: Internal Medicine

## 2019-03-12 ENCOUNTER — Encounter: Payer: Self-pay | Admitting: Internal Medicine

## 2019-03-12 DIAGNOSIS — F39 Unspecified mood [affective] disorder: Secondary | ICD-10-CM | POA: Diagnosis not present

## 2019-03-12 DIAGNOSIS — S069X0S Unspecified intracranial injury without loss of consciousness, sequela: Secondary | ICD-10-CM | POA: Diagnosis not present

## 2019-03-12 DIAGNOSIS — E1149 Type 2 diabetes mellitus with other diabetic neurological complication: Secondary | ICD-10-CM

## 2019-03-12 DIAGNOSIS — I10 Essential (primary) hypertension: Secondary | ICD-10-CM

## 2019-03-12 MED ORDER — GABAPENTIN 100 MG PO CAPS: 100.0000 mg | ORAL_CAPSULE | Freq: Every day | ORAL | 11 refills | Status: DC

## 2019-03-12 NOTE — Assessment & Plan Note (Signed)
Lab Results  Component Value Date   HGBA1C 7.3 (A) 09/04/2018   Will recheck labs Sounds like he still has acceptable control Will add gabapentin at bedtime--since he is not sleeping well and stinging in his feet is worse (and part of the sleep problem). Can titrate up if needed

## 2019-03-12 NOTE — Progress Notes (Signed)
Subjective:    Patient ID: Patrick [name], male    DOB: 13-Mar-1936, 83 y.o.   MRN: [id number]  HPI Visit for follow up of diabetes and other chronic health conditions Attempted video but he could not get it set up  Interactive audio and video telecommunications were attempted between this provider and patient, however failed, due to patient having technical difficulties OR patient did not have access to video capability.  We continued and completed visit with audio only.   Virtual Visit via Telephone Note  I connected with [name] on 03/12/19 at 10:00 AM EDT by telephone and verified that I am speaking with the correct person using two identifiers.  Location: Patient: home Provider: office   I discussed the limitations, risks, security and privacy concerns of performing an evaluation and management service by telephone and the availability of in person appointments. I also discussed with the patient that there may be a patient responsible charge related to this service. The patient expressed understanding and agreed to proceed.   History of Present Illness: He is having a problem with the social distancing Will go to the grocery store with mask Goes through drive though every morning to get breakfast for his 55 year old sister  Continues to check sugars--- 110-140 or so No hypoglycemic reactions--but may feel weak if outside working (comes in for some nabs)  Feet still swelling and they ache Seems to happen even if they are up----the stinging is now fairly persistent Notes it at night especially---trouble sleeping at night and naps in day   Still gets intermittent symptoms from the concussion Can't climb a ladder Some panic/vertigo at times--the alprazolam helps this Does take this bid regularly due to anxiety otherwise No persistent depression Gets lonely sometimes--but living alone since 1990  No chest pain No SOB Does sleep flat---but trouble initiating No  PND Nocturia x 1  Current Outpatient Medications on File Prior to Visit  Medication Sig Dispense Refill  . ALPRAZolam (XANAX) 1 MG tablet TAKE 1 TABLET BY MOUTH TWICE A DAY AS NEEDED 60 tablet 0  . Ascorbic Acid (VITAMIN C) 1000 MG tablet Take 1,000 mg by mouth daily.    Marland Kitchen aspirin 81 MG tablet Take 81 mg by mouth daily.      . cholecalciferol (VITAMIN D) 1000 units tablet Take 1,000 Units by mouth daily.    Marland Kitchen desonide (DESOWEN) 0.05 % lotion APPLY TO AFFECTED AREA ON FACE TWICE DAILY 118 mL 0  . fish oil-omega-3 fatty acids 1000 MG capsule Take 2 g by mouth daily.      Marland Kitchen glimepiride (AMARYL) 4 MG tablet TAKE 1 TABLET BY MOUTH TWICE A DAY FOR DIABETES 180 tablet 3  . glucose blood (ONE TOUCH ULTRA TEST) test strip USE TO TEST BLOOD SUGAR TWICE DAILY. Dx Code E11.49 100 each 3  . ketoconazole (NIZORAL) 2 % cream Apply twice daily as needed 60 g 1  . latanoprost (XALATAN) 0.005 % ophthalmic solution Place 1 drop into both eyes Daily.    Marland Kitchen lisinopril (PRINIVIL,ZESTRIL) 40 MG tablet TAKE 1 TABLET BY MOUTH EVERY MORNING FORBLOOD PRESSURE 90 tablet 3  . metFORMIN (GLUCOPHAGE) 1000 MG tablet TAKE 1 TABLET BY MOUTH BEFORE BREAKFAST AND TAKE 1 TAB BEFORE DINNER,AND 1/2 TAB AT BEDTIME FOR DIABETES 75 tablet 11  . ONETOUCH DELICA LANCETS 96Q MISC 1 Units by Does not apply route as needed. Use one to obtain blood sugar. Dx  E11.49 100 each 3  . verapamil (  CALAN-SR) 240 MG CR tablet TAKE 1 TABLET BY MOUTH EVERY MORNING FORBLOOD PRESSURE. 90 tablet 3  . vitamin E 400 UNIT capsule Take 400 Units by mouth daily.     No current facility-administered medications on file prior to visit.     Allergies  Allergen Reactions  . Gabapentin Other (See Comments)    Dizziness, lightheaded, nausea    Past Medical History:  Diagnosis Date  . Anxiety   . Diabetes mellitus   . GERD (gastroesophageal reflux disease)   . Hyperlipidemia   . Hypertension   . Irregular heartbeat    Dr. (his) stated that it was  "nothing to worry about."  . Peptic ulcer disease   . Renal cyst   . Rosacea     Past Surgical History:  Procedure Laterality Date  . CHOLECYSTECTOMY    . KNEE ARTHROSCOPY     left  . PARTIAL GASTRECTOMY  1974    Family History  Problem Relation Age of Onset  . Cancer Brother        prostate  . Colon cancer Brother   . Kidney failure Brother   . Cancer Sister   . Heart disease Brother   . Cancer Brother        prostate cancer  . Cancer Brother        prostate    Social History   Socioeconomic History  . Marital status: Widowed    Spouse name: Not on file  . Number of children: 2  . Years of education: Not on file  . Highest education level: Not on file  Occupational History  . Occupation: RETIRED, Programmer, systems: RETIRED  Social Needs  . Financial resource strain: Not on file  . Food insecurity:    Worry: Not on file    Inability: Not on file  . Transportation needs:    Medical: Not on file    Non-medical: Not on file  Tobacco Use  . Smoking status: Never Smoker  . Smokeless tobacco: Never Used  Substance and Sexual Activity  . Alcohol use: No  . Drug use: Not on file  . Sexual activity: Not on file  Lifestyle  . Physical activity:    Days per week: Not on file    Minutes per session: Not on file  . Stress: Not on file  Relationships  . Social connections:    Talks on phone: Not on file    Gets together: Not on file    Attends religious service: Not on file    Active member of club or organization: Not on file    Attends meetings of clubs or organizations: Not on file    Relationship status: Not on file  . Intimate partner violence:    Fear of current or ex partner: Not on file    Emotionally abused: Not on file    Physically abused: Not on file    Forced sexual activity: Not on file  Other Topics Concern  . Not on file  Social History Narrative   Now has living will   Son is health care POA--no alternate   Would accept  resuscitation attempts   No tube feeds if cognitively unaware   Observations/Objective: Sounds mildly down--but doesn't endorse severe depression, SI, etc No respiratory difficulty  Assessment and Plan: See problem list  Follow Up Instructions:    I discussed the assessment and treatment plan with the patient. The patient was provided an opportunity to ask questions and  all were answered. The patient agreed with the plan and demonstrated an understanding of the instructions.   The patient was advised to call back or seek an in-person evaluation if the symptoms worsen or if the condition fails to improve as anticipated.  I provided 14 minutes of non-face-to-face time during this encounter.   Viviana Simpler, MD    Review of Systems     Objective:   Physical Exam         Assessment & Plan:

## 2019-03-12 NOTE — Assessment & Plan Note (Signed)
BP Readings from Last 3 Encounters:  11/17/18 122/70  09/04/18 118/76  02/20/18 114/60   Generally controlled Due for labs May want to decrease verapamil if swelling is more of an issue

## 2019-03-12 NOTE — Assessment & Plan Note (Signed)
Chronic anxiety issues Now with dysthymia--especially with social distancing Would not add Rx to the xanax for now

## 2019-03-12 NOTE — Assessment & Plan Note (Signed)
Still gets vertigo and anxiety since his concussion Uses the xanax regularly

## 2019-03-16 ENCOUNTER — Other Ambulatory Visit (INDEPENDENT_AMBULATORY_CARE_PROVIDER_SITE_OTHER): Payer: PPO

## 2019-03-16 ENCOUNTER — Other Ambulatory Visit: Payer: Self-pay

## 2019-03-16 DIAGNOSIS — E1149 Type 2 diabetes mellitus with other diabetic neurological complication: Secondary | ICD-10-CM

## 2019-03-16 LAB — COMPREHENSIVE METABOLIC PANEL
ALT: 15 U/L (ref 0–53)
AST: 13 U/L (ref 0–37)
Albumin: 4.1 g/dL (ref 3.5–5.2)
Alkaline Phosphatase: 52 U/L (ref 39–117)
BUN: 18 mg/dL (ref 6–23)
CO2: 30 mEq/L (ref 19–32)
Calcium: 9 mg/dL (ref 8.4–10.5)
Chloride: 100 mEq/L (ref 96–112)
Creatinine, Ser: 0.95 mg/dL (ref 0.40–1.50)
GFR: 76.17 mL/min (ref >60.00)
Glucose, Bld: 141 mg/dL — ABNORMAL HIGH (ref 70–99)
Potassium: 4.7 mEq/L (ref 3.5–5.1)
Sodium: 137 mEq/L (ref 135–145)
Total Bilirubin: 0.4 mg/dL (ref 0.2–1.2)
Total Protein: 6.9 g/dL (ref 6.0–8.3)

## 2019-03-16 LAB — CBC
HCT: 42.6 % (ref 39.0–52.0)
Hemoglobin: 14.6 g/dL (ref 13.0–17.0)
MCHC: 34.2 g/dL (ref 30.0–36.0)
MCV: 93.2 fl (ref 78.0–100.0)
Platelets: 225 10*3/uL (ref 150.0–400.0)
RBC: 4.56 Mil/uL (ref 4.22–5.81)
RDW: 12.7 % (ref 11.5–15.5)
WBC: 5.8 10*3/uL (ref 4.0–10.5)

## 2019-03-16 LAB — LIPID PANEL
Cholesterol: 153 mg/dL (ref 0–200)
HDL: 35.5 mg/dL — ABNORMAL LOW (ref >39.00)
LDL Cholesterol: 84 mg/dL (ref 0–99)
NonHDL: 117.52
Total CHOL/HDL Ratio: 4
Triglycerides: 167 mg/dL — ABNORMAL HIGH (ref 0.0–149.0)
VLDL: 33.4 mg/dL (ref 0.0–40.0)

## 2019-03-16 LAB — HEMOGLOBIN A1C: Hgb A1c MFr Bld: 8.7 % — ABNORMAL HIGH (ref 4.6–6.5)

## 2019-03-16 LAB — T4, FREE: Free T4: 0.78 ng/dL (ref 0.60–1.60)

## 2019-03-17 ENCOUNTER — Other Ambulatory Visit: Payer: Self-pay | Admitting: Internal Medicine

## 2019-03-17 DIAGNOSIS — F39 Unspecified mood [affective] disorder: Secondary | ICD-10-CM

## 2019-03-17 NOTE — Telephone Encounter (Signed)
Last filled 02-17-19 #60 Last OV 03-12-19 Next OV 09-07-19 [place] to [facility] in Dr Alla German absence

## 2019-04-14 ENCOUNTER — Other Ambulatory Visit: Payer: Self-pay | Admitting: Internal Medicine

## 2019-04-14 DIAGNOSIS — F39 Unspecified mood [affective] disorder: Secondary | ICD-10-CM

## 2019-04-15 NOTE — Telephone Encounter (Signed)
Last filled 03-17-19 #60 Last OV 03-12-19 Next OV 09-07-19 [place]

## 2019-05-17 ENCOUNTER — Other Ambulatory Visit: Payer: Self-pay | Admitting: Internal Medicine

## 2019-05-17 DIAGNOSIS — F39 Unspecified mood [affective] disorder: Secondary | ICD-10-CM

## 2019-05-17 NOTE — Telephone Encounter (Signed)
Last filled 04-15-19 #60 Last OV 03-12-19 Next OV 09-07-19 Patrick [name]

## 2019-06-11 ENCOUNTER — Other Ambulatory Visit: Payer: Self-pay | Admitting: Internal Medicine

## 2019-06-11 DIAGNOSIS — F39 Unspecified mood [affective] disorder: Secondary | ICD-10-CM

## 2019-06-11 NOTE — Telephone Encounter (Signed)
Last filled 05-17-19 #60 Last OV 03-12-19 Next OV 09-07-19 [place]

## 2019-06-22 ENCOUNTER — Telehealth: Payer: Self-pay | Admitting: *Deleted

## 2019-06-22 NOTE — Telephone Encounter (Signed)
Patient called with concerns regarding his blood sugar readings. Patient was scheduled for an appointment with Dr. Silvio Pate on 06/25/19, but transferred to triage because of his symptoms.  Patient stated that his FBS's have been running 154 -214 and then they seem to spike between 3:00-4:00 pm. and he is getting readings of 187-200.  Patient stated that he can tell when his blood sugar is elevated because he gets a headache. Patient stated that he gets dizzy sometimes if he stands up too fast. Patient stated that he has gotten where when he is moving the yard on a riding lawnmower he has to take a couple of breaks. Patient stated that he is taking his diabetic medication as listed and has not missed any doses. Patient wants to know if he needs to make any adjustments on his medications before his upcoming appointment?

## 2019-06-22 NOTE — Telephone Encounter (Signed)
Spoke to pt. He said he will hold off until Friday.

## 2019-06-22 NOTE — Telephone Encounter (Signed)
Those really don't seem very worrisome to me---- up to 200 after eating can be normal. The high fasting sugars may warrant an increase in diabetes meds though It is probably okay to wait till Friday--but you can add him on tomorrow at 12:30 or at start of day on Thursday if he prefers

## 2019-06-25 ENCOUNTER — Other Ambulatory Visit: Payer: Self-pay

## 2019-06-25 ENCOUNTER — Ambulatory Visit (INDEPENDENT_AMBULATORY_CARE_PROVIDER_SITE_OTHER): Payer: PPO | Admitting: Internal Medicine

## 2019-06-25 ENCOUNTER — Encounter: Payer: Self-pay | Admitting: Internal Medicine

## 2019-06-25 VITALS — BP 136/80 | HR 72 | Temp 98.0°F | Ht 66.75 in | Wt 187.0 lb

## 2019-06-25 DIAGNOSIS — E1165 Type 2 diabetes mellitus with hyperglycemia: Secondary | ICD-10-CM

## 2019-06-25 DIAGNOSIS — Z23 Encounter for immunization: Secondary | ICD-10-CM

## 2019-06-25 DIAGNOSIS — IMO0002 Reserved for concepts with insufficient information to code with codable children: Secondary | ICD-10-CM

## 2019-06-25 DIAGNOSIS — E1140 Type 2 diabetes mellitus with diabetic neuropathy, unspecified: Secondary | ICD-10-CM

## 2019-06-25 LAB — POCT GLYCOSYLATED HEMOGLOBIN (HGB A1C): Hemoglobin A1C: 7.5 % — AB (ref 4.0–5.6)

## 2019-06-25 NOTE — Assessment & Plan Note (Addendum)
Control seems to have worsened If worse, will consider dapagoflozin Gabapentin helped him sleep ---told him to restart 100mg  at bedtime  Lab Results  Component Value Date   HGBA1C 7.5 (A) 06/25/2019   Surprising that it is that much better Reassured Wouldn't change meds now

## 2019-06-25 NOTE — Progress Notes (Signed)
Subjective:    Patient ID: Patrick [name], male    DOB: 08-Jun-1936, 83 y.o.   MRN: [id number]  HPI Here with concern about his sugars going up even more  Getting sugars in the 150's fasting for the most part Some dizziness and headache in afternoons---may be up to 200 (like before supper) Eating about the same AM readings had been lower 3 months  No chest pain No SOB  Current Outpatient Medications on File Prior to Visit  Medication Sig Dispense Refill  . ALPRAZolam (XANAX) 1 MG tablet TAKE 1 TABLET BY MOUTH TWICE A DAY AS NEEDED 60 tablet 0  . Ascorbic Acid (VITAMIN C) 1000 MG tablet Take 1,000 mg by mouth daily.    Marland Kitchen aspirin 81 MG tablet Take 81 mg by mouth daily.      . cholecalciferol (VITAMIN D) 1000 units tablet Take 1,000 Units by mouth daily.    Marland Kitchen desonide (DESOWEN) 0.05 % lotion APPLY TO AFFECTED AREA ON FACE TWICE DAILY 118 mL 0  . fish oil-omega-3 fatty acids 1000 MG capsule Take 2 g by mouth daily.      Marland Kitchen gabapentin (NEURONTIN) 100 MG capsule Take 1-2 capsules (100-200 mg total) by mouth at bedtime. 60 capsule 11  . glimepiride (AMARYL) 4 MG tablet TAKE 1 TABLET BY MOUTH TWICE A DAY FOR DIABETES 180 tablet 3  . glucose blood (ONE TOUCH ULTRA TEST) test strip USE TO TEST BLOOD SUGAR TWICE DAILY. Dx Code E11.49 100 each 3  . ketoconazole (NIZORAL) 2 % cream Apply twice daily as needed 60 g 1  . latanoprost (XALATAN) 0.005 % ophthalmic solution Place 1 drop into both eyes Daily.    Marland Kitchen lisinopril (PRINIVIL,ZESTRIL) 40 MG tablet TAKE 1 TABLET BY MOUTH EVERY MORNING FORBLOOD PRESSURE 90 tablet 3  . metFORMIN (GLUCOPHAGE) 1000 MG tablet TAKE 1 TABLET BY MOUTH BEFORE BREAKFAST AND TAKE 1 TABLET BEFORE DINNER AND 1/2 TAB AT BEDTIME FOR DIABETES 75 tablet 11  . ONETOUCH DELICA LANCETS 99991111 MISC 1 Units by Does not apply route as needed. Use one to obtain blood sugar. Dx  E11.49 100 each 3  . verapamil (CALAN-SR) 240 MG CR tablet TAKE 1 TABLET BY MOUTH EVERY MORNING FORBLOOD  PRESSURE. 90 tablet 3  . vitamin E 400 UNIT capsule Take 400 Units by mouth daily.     No current facility-administered medications on file prior to visit.     Allergies  Allergen Reactions  . Gabapentin Other (See Comments)    Dizziness, lightheaded, nausea    Past Medical History:  Diagnosis Date  . Anxiety   . Diabetes mellitus   . GERD (gastroesophageal reflux disease)   . Hyperlipidemia   . Hypertension   . Irregular heartbeat    Dr. (his) stated that it was "nothing to worry about."  . Peptic ulcer disease   . Renal cyst   . Rosacea     Past Surgical History:  Procedure Laterality Date  . CHOLECYSTECTOMY    . KNEE ARTHROSCOPY     left  . PARTIAL GASTRECTOMY  1974    Family History  Problem Relation Age of Onset  . Cancer Brother        prostate  . Colon cancer Brother   . Kidney failure Brother   . Cancer Sister   . Heart disease Brother   . Cancer Brother        prostate cancer  . Cancer Brother        prostate  Social History   Socioeconomic History  . Marital status: Widowed    Spouse name: Not on file  . Number of children: 2  . Years of education: Not on file  . Highest education level: Not on file  Occupational History  . Occupation: RETIRED, Programmer, systems: RETIRED  Social Needs  . Financial resource strain: Not on file  . Food insecurity    Worry: Not on file    Inability: Not on file  . Transportation needs    Medical: Not on file    Non-medical: Not on file  Tobacco Use  . Smoking status: Never Smoker  . Smokeless tobacco: Never Used  Substance and Sexual Activity  . Alcohol use: No  . Drug use: Not on file  . Sexual activity: Not on file  Lifestyle  . Physical activity    Days per week: Not on file    Minutes per session: Not on file  . Stress: Not on file  Relationships  . Social Herbalist on phone: Not on file    Gets together: Not on file    Attends religious service: Not on file    Active  member of club or organization: Not on file    Attends meetings of clubs or organizations: Not on file    Relationship status: Not on file  . Intimate partner violence    Fear of current or ex partner: Not on file    Emotionally abused: Not on file    Physically abused: Not on file    Forced sexual activity: Not on file  Other Topics Concern  . Not on file  Social History Narrative   Now has living will   Son is health care POA--no alternate   Would accept resuscitation attempts   No tube feeds if cognitively unaware    Review of Systems Weight is fairly stable Still doesn't sleep great---gabapentin did help but he didn't refill (100mg  was enough)     Objective:   Physical Exam  Constitutional: He appears well-developed. No distress.  Cardiovascular: Normal rate, regular rhythm and normal heart sounds. Exam reveals no gallop.  No murmur heard. Respiratory: Effort normal and breath sounds normal. No respiratory distress. He has no wheezes. He has no rales.  Musculoskeletal:        General: No edema.           Assessment & Plan:

## 2019-07-13 ENCOUNTER — Other Ambulatory Visit: Payer: Self-pay | Admitting: Internal Medicine

## 2019-07-13 DIAGNOSIS — F39 Unspecified mood [affective] disorder: Secondary | ICD-10-CM

## 2019-07-13 NOTE — Telephone Encounter (Signed)
Last filled 06-14-19 #60 Last OV 06-25-19 Next OV 09-07-19 [place]

## 2019-08-04 DIAGNOSIS — H40003 Preglaucoma, unspecified, bilateral: Secondary | ICD-10-CM | POA: Diagnosis not present

## 2019-08-04 DIAGNOSIS — E119 Type 2 diabetes mellitus without complications: Secondary | ICD-10-CM | POA: Diagnosis not present

## 2019-08-04 LAB — HM DIABETES EYE EXAM

## 2019-08-11 ENCOUNTER — Other Ambulatory Visit: Payer: Self-pay | Admitting: Internal Medicine

## 2019-08-11 DIAGNOSIS — F39 Unspecified mood [affective] disorder: Secondary | ICD-10-CM

## 2019-08-12 NOTE — Telephone Encounter (Signed)
Last filled 07-13-19 #60 Last OV 06-25-19 Next OV 09-07-19 [place]  Sent to [facility] in Dr Alla German absence

## 2019-08-17 ENCOUNTER — Encounter: Payer: Self-pay | Admitting: Ophthalmology

## 2019-09-07 ENCOUNTER — Ambulatory Visit (INDEPENDENT_AMBULATORY_CARE_PROVIDER_SITE_OTHER): Payer: PPO | Admitting: Internal Medicine

## 2019-09-07 ENCOUNTER — Other Ambulatory Visit: Payer: Self-pay

## 2019-09-07 ENCOUNTER — Encounter: Payer: Self-pay | Admitting: Internal Medicine

## 2019-09-07 VITALS — BP 140/88 | HR 97 | Temp 98.3°F | Ht 66.5 in | Wt 185.0 lb

## 2019-09-07 DIAGNOSIS — E1140 Type 2 diabetes mellitus with diabetic neuropathy, unspecified: Secondary | ICD-10-CM

## 2019-09-07 DIAGNOSIS — G3184 Mild cognitive impairment, so stated: Secondary | ICD-10-CM

## 2019-09-07 DIAGNOSIS — I10 Essential (primary) hypertension: Secondary | ICD-10-CM

## 2019-09-07 DIAGNOSIS — Z7189 Other specified counseling: Secondary | ICD-10-CM | POA: Diagnosis not present

## 2019-09-07 DIAGNOSIS — K219 Gastro-esophageal reflux disease without esophagitis: Secondary | ICD-10-CM | POA: Diagnosis not present

## 2019-09-07 DIAGNOSIS — R21 Rash and other nonspecific skin eruption: Secondary | ICD-10-CM | POA: Diagnosis not present

## 2019-09-07 DIAGNOSIS — E1165 Type 2 diabetes mellitus with hyperglycemia: Secondary | ICD-10-CM

## 2019-09-07 DIAGNOSIS — IMO0002 Reserved for concepts with insufficient information to code with codable children: Secondary | ICD-10-CM

## 2019-09-07 DIAGNOSIS — S069X0S Unspecified intracranial injury without loss of consciousness, sequela: Secondary | ICD-10-CM

## 2019-09-07 DIAGNOSIS — Z0000 Encounter for general adult medical examination without abnormal findings: Secondary | ICD-10-CM | POA: Diagnosis not present

## 2019-09-07 LAB — POCT GLYCOSYLATED HEMOGLOBIN (HGB A1C): Hemoglobin A1C: 7.7 % — AB (ref 4.0–5.6)

## 2019-09-07 LAB — HM DIABETES FOOT EXAM

## 2019-09-07 MED ORDER — FLUCONAZOLE 150 MG PO TABS: 150.0000 mg | ORAL_TABLET | ORAL | 2 refills | Status: DC

## 2019-09-07 NOTE — Assessment & Plan Note (Signed)
Stable or even better than before No action needed

## 2019-09-07 NOTE — Progress Notes (Signed)
Subjective:    Patient ID: Patrick [name], male    DOB: 1936/02/02, 83 y.o.   MRN: [id number]  HPI Here for Medicare wellness visit and follow up of chronic health conditions Reviewed advanced directives Reviewed other doctors---new dentist on [place], [place], [facility] No hospitalizations or surgery in the past year Vision is fine Hearing seems good--no functional problems No falls No depression or anhedonia Stays active in yard--no separate exercise. Does walk some Independent with instrumental ADLs Mild memory issues seem stable  His only concern is swelling in his feet Seem to improve at times--then come back Uses lotion at night--and they are better in the morning Worsen when up doing work on the yard, etc No pain but does have some pitting Still has decreased sensation on bottom of feet--did try gabapentin for a while and it might have helped him sleep (hasn't refilled)  No SOB No chest pain No palpitations No syncope. Does have some orthostatic dizziness if he stands up quickly after bending (like to pull weeds) No headaches--or rarely (if out in sun a long time)  Checking sugars 120-150 fasting No low sugar reactions  Still gets some vertigo Ongoing mild memory issues Nerves act up at times since the concussion---xanax helps Not depressed but affected by the social distancing issues  Will occasionally have heartburn--takes gaviscon and water with relief Every other week or so No dysphagia  Current Outpatient Medications on File Prior to Visit  Medication Sig Dispense Refill  . ALPRAZolam (XANAX) 1 MG tablet TAKE 1 TABLET BY MOUTH TWICE A DAY AS NEEDED 60 tablet 0  . Ascorbic Acid (VITAMIN C) 1000 MG tablet Take 1,000 mg by mouth daily.    Marland Kitchen aspirin 81 MG tablet Take 81 mg by mouth daily.      . cholecalciferol (VITAMIN D) 1000 units tablet Take 1,000 Units by mouth daily.    Marland Kitchen desonide (DESOWEN) 0.05 % lotion APPLY TO AFFECTED AREA ON  FACE TWICE DAILY 118 mL 0  . fish oil-omega-3 fatty acids 1000 MG capsule Take 2 g by mouth daily.      Marland Kitchen glimepiride (AMARYL) 4 MG tablet TAKE 1 TABLET BY MOUTH TWICE A DAY FOR DIABETES 180 tablet 3  . glucose blood (ONE TOUCH ULTRA TEST) test strip USE TO TEST BLOOD SUGAR TWICE DAILY. Dx Code E11.49 100 each 3  . ketoconazole (NIZORAL) 2 % cream Apply twice daily as needed 60 g 1  . latanoprost (XALATAN) 0.005 % ophthalmic solution Place 1 drop into both eyes Daily.    Marland Kitchen lisinopril (PRINIVIL,ZESTRIL) 40 MG tablet TAKE 1 TABLET BY MOUTH EVERY MORNING FORBLOOD PRESSURE 90 tablet 3  . metFORMIN (GLUCOPHAGE) 1000 MG tablet TAKE 1 TABLET BY MOUTH BEFORE BREAKFAST AND TAKE 1 TABLET BEFORE DINNER AND 1/2 TAB AT BEDTIME FOR DIABETES 75 tablet 11  . ONETOUCH DELICA LANCETS 99991111 MISC 1 Units by Does not apply route as needed. Use one to obtain blood sugar. Dx  E11.49 100 each 3  . verapamil (CALAN-SR) 240 MG CR tablet TAKE 1 TABLET BY MOUTH EVERY MORNING FORBLOOD PRESSURE. 90 tablet 3  . vitamin E 400 UNIT capsule Take 400 Units by mouth daily.     No current facility-administered medications on file prior to visit.     Allergies  Allergen Reactions  . Gabapentin Other (See Comments)    Dizziness, lightheaded, nausea    Past Medical History:  Diagnosis Date  . Anxiety   . Diabetes mellitus   .  GERD (gastroesophageal reflux disease)   . Hyperlipidemia   . Hypertension   . Irregular heartbeat    Dr. (his) stated that it was "nothing to worry about."  . Peptic ulcer disease   . Renal cyst   . Rosacea     Past Surgical History:  Procedure Laterality Date  . CHOLECYSTECTOMY    . KNEE ARTHROSCOPY     left  . PARTIAL GASTRECTOMY  1974    Family History  Problem Relation Age of Onset  . Cancer Brother        prostate  . Colon cancer Brother   . Kidney failure Brother   . Cancer Sister   . Heart disease Brother   . Cancer Brother        prostate cancer  . Cancer Brother         prostate    Social History   Socioeconomic History  . Marital status: Widowed    Spouse name: Not on file  . Number of children: 2  . Years of education: Not on file  . Highest education level: Not on file  Occupational History  . Occupation: RETIRED, Programmer, systems: RETIRED  Social Needs  . Financial resource strain: Not on file  . Food insecurity    Worry: Not on file    Inability: Not on file  . Transportation needs    Medical: Not on file    Non-medical: Not on file  Tobacco Use  . Smoking status: Never Smoker  . Smokeless tobacco: Never Used  Substance and Sexual Activity  . Alcohol use: No  . Drug use: Not on file  . Sexual activity: Not on file  Lifestyle  . Physical activity    Days per week: Not on file    Minutes per session: Not on file  . Stress: Not on file  Relationships  . Social Herbalist on phone: Not on file    Gets together: Not on file    Attends religious service: Not on file    Active member of club or organization: Not on file    Attends meetings of clubs or organizations: Not on file    Relationship status: Not on file  . Intimate partner violence    Fear of current or ex partner: Not on file    Emotionally abused: Not on file    Physically abused: Not on file    Forced sexual activity: Not on file  Other Topics Concern  . Not on file  Social History Narrative   Now has living will   Son is health care POA--no alternate   Would accept resuscitation attempts   No tube feeds if cognitively unaware   Review of Systems Sleeping better lately Has needed some recent dental work New rash on bottom--feels like blister (from last week--?related to riding on mower) No other skin lesions of concern Appetite is good Weight is stable Wears seat belt Bowels moving well--no blood Nocturia x 3 now. No increase in the day. Flow is better No sig back or joint pains    Objective:   Physical Exam  Constitutional: He is  oriented to person, place, and time. He appears well-developed. No distress.  HENT:  Mouth/Throat: Oropharynx is clear and moist. No oropharyngeal exudate.  Neck: No thyromegaly present.  Cardiovascular: Normal rate, regular rhythm, normal heart sounds and intact distal pulses. Exam reveals no gallop.  No murmur heard. Respiratory: Effort normal and breath sounds normal.  No respiratory distress. He has no wheezes. He has no rales.  GI: Soft. There is no abdominal tenderness.  Musculoskeletal:        General: No tenderness.     Comments: 1+ edema in feet  Lymphadenopathy:    He has no cervical adenopathy.  Neurological: He is alert and oriented to person, place, and time.  President--"Donald Trump, Obama, not [employer]----?Johnson" [phone], no 85 [id number] Recall 3/3  Slight decrease in sensation in plantar feet  Skin:  Scaly rash across much of back 2 small indurated red areas on convex portion of medial right buttock (no infection)  Psychiatric: He has a normal mood and affect. His behavior is normal.           Assessment & Plan:

## 2019-09-07 NOTE — Assessment & Plan Note (Signed)
See social history 

## 2019-09-07 NOTE — Assessment & Plan Note (Signed)
I have personally reviewed the Medicare Annual Wellness questionnaire and have noted 1. The patient's medical and social history 2. Their use of alcohol, tobacco or illicit drugs 3. Their current medications and supplements 4. The patient's functional ability including ADL's, fall risks, home safety risks and hearing or visual             impairment. 5. Diet and physical activities 6. Evidence for depression or mood disorders  The patients weight, height, BMI and visual acuity have been recorded in the chart I have made referrals, counseling and provided education to the patient based review of the above and I have provided the pt with a written personalized care plan for preventive services.  I have provided you with a copy of your personalized plan for preventive services. Please take the time to review along with your updated medication list.  No cancer screening due to age Had flu vaccine---yearly Discussed exercise

## 2019-09-07 NOTE — Assessment & Plan Note (Signed)
Still with vertigo and emotional changes (like panic at times) since concussion Has the xanax for prn use

## 2019-09-07 NOTE — Assessment & Plan Note (Signed)
BP Readings from Last 3 Encounters:  09/07/19 140/88  06/25/19 136/80  11/17/18 122/70   Reasonable control

## 2019-09-07 NOTE — Addendum Note (Signed)
Addended by: Viviana Simpler I on: 09/07/2019 11:55 AM   Modules accepted: Orders

## 2019-09-07 NOTE — Assessment & Plan Note (Signed)
Looks fungal on back--will try weekly fluconazole Buttock looks pressure related--discussed zinc oxide and cushioning for his mower seat

## 2019-09-07 NOTE — Assessment & Plan Note (Signed)
Using the gaviscon rarely so no further action needed

## 2019-09-07 NOTE — Assessment & Plan Note (Addendum)
Did improve last time Hopefully still good Gabapentin helped restore normal sleep pattern---not using for now  Lab Results  Component Value Date   HGBA1C 7.7 (A) 09/07/2019   Still acceptable control No med changes needed

## 2019-09-07 NOTE — Progress Notes (Signed)
Hearing Screening   Method: Audiometry   125Hz 250Hz 500Hz 1000Hz 2000Hz 3000Hz 4000Hz 6000Hz 8000Hz  Right ear:   20 20 20  20    Left ear:   20 20 20  20    Vision Screening Comments: October 2020   

## 2019-09-13 ENCOUNTER — Other Ambulatory Visit: Payer: Self-pay | Admitting: Internal Medicine

## 2019-09-13 DIAGNOSIS — F39 Unspecified mood [affective] disorder: Secondary | ICD-10-CM

## 2019-09-14 NOTE — Telephone Encounter (Signed)
Xanax 1mg  Last filled 08-12-19 #60 Last OV 09-07-19 Next OV 03-07-2020 Patrick [name]

## 2019-09-16 ENCOUNTER — Other Ambulatory Visit: Payer: Self-pay

## 2019-09-16 MED ORDER — GLUCOSE BLOOD VI STRP: ORAL_STRIP | 3 refills | Status: DC

## 2019-09-16 NOTE — Telephone Encounter (Signed)
Rx sent electronically.  

## 2019-09-22 ENCOUNTER — Other Ambulatory Visit: Payer: Self-pay

## 2019-10-07 ENCOUNTER — Other Ambulatory Visit: Payer: Self-pay | Admitting: Internal Medicine

## 2019-10-07 DIAGNOSIS — F39 Unspecified mood [affective] disorder: Secondary | ICD-10-CM

## 2019-10-12 ENCOUNTER — Other Ambulatory Visit: Payer: Self-pay | Admitting: Internal Medicine

## 2019-10-12 DIAGNOSIS — F39 Unspecified mood [affective] disorder: Secondary | ICD-10-CM

## 2019-10-13 NOTE — Telephone Encounter (Signed)
Xanax 1mg  Last filled 09-15-19 #60 Last OV 09-07-19 Next OV 03-07-2020 [place]

## 2019-11-08 ENCOUNTER — Other Ambulatory Visit: Payer: Self-pay | Admitting: Internal Medicine

## 2019-11-08 DIAGNOSIS — F39 Unspecified mood [affective] disorder: Secondary | ICD-10-CM

## 2019-11-08 NOTE — Telephone Encounter (Signed)
Last filled 10-13-19 #60 Last OV 09-07-19 Next OV 03-07-20 [place]

## 2019-12-03 ENCOUNTER — Other Ambulatory Visit: Payer: Self-pay | Admitting: Internal Medicine

## 2019-12-03 DIAGNOSIS — F39 Unspecified mood [affective] disorder: Secondary | ICD-10-CM

## 2019-12-03 NOTE — Telephone Encounter (Signed)
Last filled 11-08-19 #60 Last OV 09-07-19 Next OV 03-07-20 [place]  Pt always calls a few days early stating the pharmacy has to order it.

## 2019-12-31 ENCOUNTER — Other Ambulatory Visit: Payer: Self-pay | Admitting: Internal Medicine

## 2019-12-31 DIAGNOSIS — F39 Unspecified mood [affective] disorder: Secondary | ICD-10-CM

## 2020-01-03 ENCOUNTER — Other Ambulatory Visit: Payer: Self-pay | Admitting: Internal Medicine

## 2020-01-03 DIAGNOSIS — F39 Unspecified mood [affective] disorder: Secondary | ICD-10-CM

## 2020-01-03 NOTE — Telephone Encounter (Signed)
Last Filled 12-10-19 #60 Last OV 09-07-19 Next OV 03-07-20 [place]

## 2020-02-02 ENCOUNTER — Other Ambulatory Visit: Payer: Self-pay | Admitting: Internal Medicine

## 2020-02-02 DIAGNOSIS — F39 Unspecified mood [affective] disorder: Secondary | ICD-10-CM

## 2020-02-02 NOTE — Telephone Encounter (Signed)
Last filled 01-07-20 #60 Last OV 09-07-19 Next OV 03-07-20 [place]  Pt always asks for several days early to make sure they have it in stock. That is what he tells me when I call and ask.

## 2020-02-25 ENCOUNTER — Telehealth: Payer: Self-pay | Admitting: Internal Medicine

## 2020-02-25 NOTE — Progress Notes (Signed)
  Chronic Care Management   Note  02/25/2020 Name: PERRIN MCPARTLAND MRN: [id number] DOB: 20-Nov-1935  BRIGHTEN LOVITT is a 84 y.o. year old male who is a primary care patient of [place], Theophilus Kinds, MD. I reached out to [name] by phone today in response to a referral sent by Mr. Sheral Apley Volden's PCP, Venia Carbon, MD.   Mr. Horng was given information about Chronic Care Management services today including:  1. CCM service includes personalized support from designated clinical staff supervised by his physician, including individualized plan of care and coordination with other care providers 2. 24/7 contact phone numbers for assistance for urgent and routine care needs. 3. Service will only be billed when office clinical staff spend 20 minutes or more in a month to coordinate care. 4. Only one practitioner may furnish and bill the service in a calendar month. 5. The patient may stop CCM services at any time (effective at the end of the month) by phone call to the office staff.   Patient agreed to services and verbal consent obtained.    This note is not being shared with the patient for the following reason: To respect privacy (The patient or proxy has requested that the information not be shared).  Follow up plan:   Raynicia Dukes [employer] Scheduler

## 2020-02-29 ENCOUNTER — Other Ambulatory Visit: Payer: Self-pay | Admitting: Internal Medicine

## 2020-02-29 DIAGNOSIS — F39 Unspecified mood [affective] disorder: Secondary | ICD-10-CM

## 2020-02-29 NOTE — Telephone Encounter (Signed)
Last filled 02-04-20 #30 Last OV 09-07-19 Next OV 03-07-20 [place]  He always asks for it several days early to make sure they have it in stock.

## 2020-03-07 ENCOUNTER — Other Ambulatory Visit: Payer: Self-pay

## 2020-03-07 ENCOUNTER — Encounter: Payer: Self-pay | Admitting: Internal Medicine

## 2020-03-07 ENCOUNTER — Ambulatory Visit (INDEPENDENT_AMBULATORY_CARE_PROVIDER_SITE_OTHER): Payer: PPO | Admitting: Internal Medicine

## 2020-03-07 VITALS — BP 126/80 | HR 67 | Temp 97.1°F | Ht 67.0 in | Wt 187.0 lb

## 2020-03-07 DIAGNOSIS — F39 Unspecified mood [affective] disorder: Secondary | ICD-10-CM

## 2020-03-07 DIAGNOSIS — S069X0S Unspecified intracranial injury without loss of consciousness, sequela: Secondary | ICD-10-CM

## 2020-03-07 DIAGNOSIS — I10 Essential (primary) hypertension: Secondary | ICD-10-CM | POA: Diagnosis not present

## 2020-03-07 DIAGNOSIS — E1142 Type 2 diabetes mellitus with diabetic polyneuropathy: Secondary | ICD-10-CM | POA: Diagnosis not present

## 2020-03-07 DIAGNOSIS — R251 Tremor, unspecified: Secondary | ICD-10-CM

## 2020-03-07 LAB — COMPREHENSIVE METABOLIC PANEL
ALT: 15 U/L (ref 0–53)
AST: 15 U/L (ref 0–37)
Albumin: 4.2 g/dL (ref 3.5–5.2)
Alkaline Phosphatase: 47 U/L (ref 39–117)
BUN: 22 mg/dL (ref 6–23)
CO2: 29 mEq/L (ref 19–32)
Calcium: 9 mg/dL (ref 8.4–10.5)
Chloride: 101 mEq/L (ref 96–112)
Creatinine, Ser: 1.1 mg/dL (ref 0.40–1.50)
GFR: 64.16 mL/min (ref >60.00)
Glucose, Bld: 188 mg/dL — ABNORMAL HIGH (ref 70–99)
Potassium: 4.6 mEq/L (ref 3.5–5.1)
Sodium: 138 mEq/L (ref 135–145)
Total Bilirubin: 0.5 mg/dL (ref 0.2–1.2)
Total Protein: 6.9 g/dL (ref 6.0–8.3)

## 2020-03-07 LAB — LIPID PANEL
Cholesterol: 171 mg/dL (ref 0–200)
HDL: 35.3 mg/dL — ABNORMAL LOW (ref >39.00)
LDL Cholesterol: 109 mg/dL — ABNORMAL HIGH (ref 0–99)
NonHDL: 135.87
Total CHOL/HDL Ratio: 5
Triglycerides: 132 mg/dL (ref 0.0–149.0)
VLDL: 26.4 mg/dL (ref 0.0–40.0)

## 2020-03-07 LAB — CBC
HCT: 42 % (ref 39.0–52.0)
Hemoglobin: 14.2 g/dL (ref 13.0–17.0)
MCHC: 33.9 g/dL (ref 30.0–36.0)
MCV: 94.1 fl (ref 78.0–100.0)
Platelets: 218 10*3/uL (ref 150.0–400.0)
RBC: 4.47 Mil/uL (ref 4.22–5.81)
RDW: 12.9 % (ref 11.5–15.5)
WBC: 5.4 10*3/uL (ref 4.0–10.5)

## 2020-03-07 LAB — HEMOGLOBIN A1C: Hgb A1c MFr Bld: 8.1 % — ABNORMAL HIGH (ref 4.6–6.5)

## 2020-03-07 MED ORDER — ALPRAZOLAM 1 MG PO TABS: 1.0000 mg | ORAL_TABLET | Freq: Two times a day (BID) | ORAL | 0 refills | Status: DC | PRN

## 2020-03-07 MED ORDER — GABAPENTIN 100 MG PO CAPS: 100.0000 mg | ORAL_CAPSULE | Freq: Every day | ORAL | 11 refills | Status: DC

## 2020-03-07 MED ORDER — KETOCONAZOLE 2 % EX CREA: TOPICAL_CREAM | CUTANEOUS | 1 refills | Status: AC

## 2020-03-07 NOTE — Assessment & Plan Note (Signed)
Still with some vertigo and emotional lability Uses the xanax prn

## 2020-03-07 NOTE — Assessment & Plan Note (Signed)
Mostly intention Strong FH No Rx for now

## 2020-03-07 NOTE — Assessment & Plan Note (Signed)
Control still seems acceptable Will check A1c Restart low dose bedtime gabapentin for neuropathy

## 2020-03-07 NOTE — Assessment & Plan Note (Signed)
BP Readings from Last 3 Encounters:  03/07/20 126/80  09/07/19 140/88  06/25/19 136/80   Good control

## 2020-03-07 NOTE — Progress Notes (Signed)
Subjective:    Patient ID: Patrick [name], male    DOB: [birth year], 84 y.o.   MRN: [id number]  HPI Here for follow up of diabetes and other chronic health conditions This visit occurred during the SARS-CoV-2 public health emergency.  Safety protocols were in place, including screening questions prior to the visit, additional usage of staff PPE, and extensive cleaning of exam room while observing appropriate contact time as indicated for disinfecting solutions.   Tends to get depressed at times  Feels a bit better when he walks outside Still feels "cooped" up---tough living alone Not daily Still has some vertigo and emotional lability---"it comes and goes" Does get relief from alprazolam Memory is stable  Ongoing pain in feet and legs  Had gotten relief with gabapentin but stopped (still sleeping okay) It does make him sleepy in the morning  Still checks his sugars every morning Variable 83-178 Average ~140 No hypoglycemic reactions Ongoing feet symptoms--see above  No chest pain or SOB No dizziness or syncope (other than the day after COVID vaccine) Slight edema  Mild shakes at times when working--this runs in his family  Current Outpatient Medications on File Prior to Visit  Medication Sig Dispense Refill  . ALPRAZolam (XANAX) 1 MG tablet TAKE 1 TABLET BY MOUTH TWICE (2) DAILY AS NEEDED 60 tablet 0  . Ascorbic Acid (VITAMIN C) 1000 MG tablet Take 1,000 mg by mouth daily.    Marland Kitchen aspirin 81 MG tablet Take 81 mg by mouth daily.      . cholecalciferol (VITAMIN D) 1000 units tablet Take 1,000 Units by mouth daily.    Marland Kitchen desonide (DESOWEN) 0.05 % lotion APPLY TO AFFECTED AREA ON FACE TWICE DAILY 118 mL 0  . fish oil-omega-3 fatty acids 1000 MG capsule Take 2 g by mouth daily.      Marland Kitchen glimepiride (AMARYL) 4 MG tablet TAKE 1 TABLET BY MOUTH TWICE A DAY FOR DIABETES 180 tablet 3  . glucose blood (ONE TOUCH ULTRA TEST) test strip USE TO TEST BLOOD SUGAR TWICE DAILY. Dx Code E11.49  100 each 3  . ketoconazole (NIZORAL) 2 % cream Apply twice daily as needed 60 g 1  . latanoprost (XALATAN) 0.005 % ophthalmic solution Place 1 drop into both eyes Daily.    Marland Kitchen lisinopril (ZESTRIL) 40 MG tablet TAKE 1 TABLET BY MOUTH EVERY MORNING FORBLOOD PRESSURE 90 tablet 3  . metFORMIN (GLUCOPHAGE) 1000 MG tablet TAKE 1 TABLET BY MOUTH BEFORE BREAKFAST AND TAKE 1 TABLET BEFORE DINNER AND 1/2 TAB AT BEDTIME FOR DIABETES 75 tablet 11  . ONETOUCH DELICA LANCETS 99991111 MISC 1 Units by Does not apply route as needed. Use one to obtain blood sugar. Dx  E11.49 100 each 3  . verapamil (CALAN-SR) 240 MG CR tablet TAKE 1 TABLET BY MOUTH EVERY MORNING FORBLOOD PRESSURE 90 tablet 3  . vitamin E 400 UNIT capsule Take 400 Units by mouth daily.    . fluconazole (DIFLUCAN) 150 MG tablet Take 1 tablet (150 mg total) by mouth once a week. (Patient not taking: Reported on 03/07/2020) 4 tablet 2   No current facility-administered medications on file prior to visit.    Allergies  Allergen Reactions  . Gabapentin Other (See Comments)    Dizziness, lightheaded, nausea    Past Medical History:  Diagnosis Date  . Anxiety   . Diabetes mellitus   . GERD (gastroesophageal reflux disease)   . Hyperlipidemia   . Hypertension   . Irregular heartbeat  Dr. (his) stated that it was "nothing to worry about."  . Peptic ulcer disease   . Renal cyst   . Rosacea     Past Surgical History:  Procedure Laterality Date  . CHOLECYSTECTOMY    . KNEE ARTHROSCOPY     left  . PARTIAL GASTRECTOMY  1974    Family History  Problem Relation Age of Onset  . Cancer Brother        prostate  . Colon cancer Brother   . Kidney failure Brother   . Cancer Sister   . Heart disease Brother   . Cancer Brother        prostate cancer  . Cancer Brother        prostate    Social History   Socioeconomic History  . Marital status: Widowed    Spouse name: Not on file  . Number of children: 2  . Years of education: Not on  file  . Highest education level: Not on file  Occupational History  . Occupation: RETIRED, Programmer, systems: RETIRED  Tobacco Use  . Smoking status: Never Smoker  . Smokeless tobacco: Never Used  Substance and Sexual Activity  . Alcohol use: No  . Drug use: Not on file  . Sexual activity: Not on file  Other Topics Concern  . Not on file  Social History Narrative   Now has living will   Son is health care POA--no alternate   Would accept resuscitation attempts   No tube feeds if cognitively unaware   Social Determinants of Health   Financial Resource Strain:   . Difficulty of Paying Living Expenses:   Food Insecurity:   . Worried About Charity fundraiser in the Last Year:   . Arboriculturist in the Last Year:   Transportation Needs:   . Film/video editor (Medical):   Marland Kitchen Lack of Transportation (Non-Medical):   Physical Activity:   . Days of Exercise per Week:   . Minutes of Exercise per Session:   Stress:   . Feeling of Stress :   Social Connections:   . Frequency of Communication with Friends and Family:   . Frequency of Social Gatherings with Friends and Family:   . Attends Religious Services:   . Active Member of Clubs or Organizations:   . Attends Archivist Meetings:   Marland Kitchen Marital Status:   Intimate Partner Violence:   . Fear of Current or Ex-Partner:   . Emotionally Abused:   Marland Kitchen Physically Abused:   . Sexually Abused:    Review of Systems Appetite is good Weight stable Sleeps fairly well Rash on back did go away    Objective:   Physical Exam  Constitutional: He appears well-developed. No distress.  Neck: No thyromegaly present.  Cardiovascular: Normal rate, regular rhythm, normal heart sounds and intact distal pulses. Exam reveals no gallop.  No murmur heard. Respiratory: Effort normal and breath sounds normal. No respiratory distress. He has no wheezes. He has no rales.  Musculoskeletal:        General: No tenderness.      Comments: Trace -1+ ankle edema bilat  Lymphadenopathy:    He has no cervical adenopathy.  Skin:  No foot lesions  Psychiatric: He has a normal mood and affect. His behavior is normal.           Assessment & Plan:

## 2020-03-07 NOTE — Assessment & Plan Note (Signed)
Some dysthymia but no MDD Only on the xanax for the panic type symptoms (post concussion)

## 2020-03-10 ENCOUNTER — Telehealth: Payer: Self-pay | Admitting: Internal Medicine

## 2020-03-10 NOTE — Telephone Encounter (Signed)
Pt was called back and advised of labs

## 2020-03-10 NOTE — Telephone Encounter (Signed)
Patient returned call about lab results. Patient said he'll be home the rest of the afternoon.

## 2020-03-29 ENCOUNTER — Other Ambulatory Visit: Payer: Self-pay | Admitting: Internal Medicine

## 2020-03-29 DIAGNOSIS — F39 Unspecified mood [affective] disorder: Secondary | ICD-10-CM

## 2020-03-29 NOTE — Telephone Encounter (Signed)
Last filled 03-01-20 #60 Last OV 03-07-20 Next OV 09-19-20 [place]

## 2020-03-30 ENCOUNTER — Telehealth: Payer: Self-pay

## 2020-03-30 DIAGNOSIS — K219 Gastro-esophageal reflux disease without esophagitis: Secondary | ICD-10-CM

## 2020-03-30 DIAGNOSIS — I10 Essential (primary) hypertension: Secondary | ICD-10-CM

## 2020-03-30 NOTE — Telephone Encounter (Signed)
Referral created.

## 2020-03-30 NOTE — Telephone Encounter (Signed)
Per written referral from PCP, requesting referral in Epic for Patrick [name] to chronic care management pharmacy services for the following conditions:   Essential hypertension, benign  [I10]]  GERD [K21.9]  Debbora Dus, PharmD Clinical Pharmacist [facility] at [facility] [phone]

## 2020-04-03 ENCOUNTER — Other Ambulatory Visit: Payer: Self-pay

## 2020-04-03 ENCOUNTER — Ambulatory Visit: Payer: PPO

## 2020-04-03 DIAGNOSIS — E785 Hyperlipidemia, unspecified: Secondary | ICD-10-CM

## 2020-04-03 DIAGNOSIS — E1142 Type 2 diabetes mellitus with diabetic polyneuropathy: Secondary | ICD-10-CM

## 2020-04-03 NOTE — Patient Instructions (Addendum)
April 03, 2020  Dear Patrick [name],  It was a pleasure meeting you during our initial appointment on April 03, 2020. Below is a summary of the goals we discussed and components of chronic care management. Please contact me anytime with questions or concerns.   Visit Information  Goals Addressed            This Visit's Progress   . Pharmacy Care Plan       CARE PLAN ENTRY  Current Barriers:  . Chronic Disease Management support, education, and care coordination needs related to Hyperlipidemia and Diabetes  Hyperlipidemia . Pharmacist Clinical Goal(s): o Over the next 6 months, patient will work with PharmD and providers to achieve LDL goal < 100, TG < 150, HDL > 40 Lipid Panel     Component Value Date/Time   CHOL 171 03/07/2020 0801   TRIG 132.0 03/07/2020 0801   HDL 35.30 (L) 03/07/2020 0801   CHOLHDL 5 03/07/2020 0801   VLDL 26.4 03/07/2020 0801   LDLCALC 109 (H) 03/07/2020 0801   LDLDIRECT 112.9 12/20/2008 0000 .  Current regimen:  o Fish oil 1000 mg - 2 capsules daily o Aspirin 81 mg - 1 tablet daily for CV prevention  . Interventions: o Consider cholesterol medication to reduce risk of heart attack or stroke . Patient self care activities - Over the next 6 months, patient will: o Continue fish oil daily o Increase exercise as able with goal of 15 minutes per day o Limit cholesterol content in foods o Call if interested in low-dose cholesterol medication o Work with pharmacist to ensure diabetes control to reduce overall CV risk   Diabetes . Pharmacist Clinical Goal(s): o Over the next 6 months, patient will work with PharmD and providers to achieve A1c goal <7% . Current regimen:   Metformin 1000 mg - 1 tablet at breakfast, 1 tablet at noon, 1/2 tablet at 5 PM  Glimepiride 4 mg - 1 tablet twice daily . Interventions: o Discussed dietary choices, patient limits carbohydrates and sugar o Recommend checking blood glucose after meals rather than in the morning to  further assess blood glucose control . Patient self care activities - Over the next 6 months, patient will: o Check blood sugar once daily 2 hours after biggest meal of the day, document, and provide at follow up appointment in 6 months o Contact provider with any episodes of hypoglycemia  Initial goal documentation      Patrick [name] was given information about Chronic Care Management services today including:  1. CCM service includes personalized support from designated clinical staff supervised by his physician, including individualized plan of care and coordination with other care providers 2. 24/7 contact phone numbers for assistance for urgent and routine care needs. 3. Standard insurance, coinsurance, copays and deductibles apply for chronic care management only during months in which we provide at least 20 minutes of these services. Most insurances cover these services at 100%, however patients may be responsible for any copay, coinsurance and/or deductible if applicable. This service may help you avoid the need for more expensive face-to-face services. 4. Only one practitioner may furnish and bill the service in a calendar month. 5. The patient may stop CCM services at any time (effective at the end of the month) by phone call to the office staff.  Patient agreed to services and verbal consent obtained.   The patient verbalized understanding of instructions provided today and agreed to receive a mailed copy of patient instruction and/or  Scientist, clinical (histocompatibility and immunogenetics). Telephone follow up appointment with pharmacy team member scheduled for:  6 months (telephone) 10/04/20 at 1:00 PM   Debbora Dus, PharmD Clinical Pharmacist [facility] at [facility] [phone]     Why follow it? Research shows. . Those who follow the Mediterranean diet have a reduced risk of heart disease  . The diet is associated with a reduced incidence of Parkinson's and Alzheimer's diseases . People  following the diet may have longer life expectancies and lower rates of chronic diseases  . The Dietary Guidelines for Americans recommends the Mediterranean diet as an eating plan to promote health and prevent disease  What Is the Mediterranean Diet?  . Healthy eating plan based on typical foods and recipes of Mediterranean-style cooking . The diet is primarily a plant based diet; these foods should make up a majority of meals   Starches - Plant based foods should make up a majority of meals - They are an important sources of vitamins, minerals, energy, antioxidants, and fiber - Choose whole grains, foods high in fiber and minimally processed items  - Typical grain sources include wheat, oats, barley, corn, brown rice, bulgar, farro, millet, polenta, couscous  - Various types of beans include chickpeas, lentils, fava beans, black beans, white beans   Fruits  Veggies - Large quantities of antioxidant rich fruits & veggies; 6 or more servings  - Vegetables can be eaten raw or lightly drizzled with oil and cooked  - Vegetables common to the traditional Mediterranean Diet include: artichokes, arugula, beets, broccoli, brussel sprouts, cabbage, carrots, celery, collard greens, cucumbers, eggplant, kale, leeks, lemons, lettuce, mushrooms, okra, onions, peas, peppers, potatoes, pumpkin, radishes, rutabaga, shallots, spinach, sweet potatoes, turnips, zucchini - Fruits common to the Mediterranean Diet include: apples, apricots, avocados, cherries, clementines, dates, figs, grapefruits, grapes, melons, nectarines, oranges, peaches, pears, pomegranates, strawberries, tangerines  Fats - Replace butter and margarine with healthy oils, such as olive oil, canola oil, and tahini  - Limit nuts to no more than a handful a day  - Nuts include walnuts, almonds, pecans, pistachios, pine nuts  - Limit or avoid candied, honey roasted or heavily salted nuts - Olives are central to the [employer] - can be eaten  whole or used in a variety of dishes   Meats Protein - Limiting red meat: no more than a few times a month - When eating red meat: choose lean cuts and keep the portion to the size of deck of cards - Eggs: approx. 0 to 4 times a week  - Fish and lean poultry: at least 2 a week  - Healthy protein sources include, chicken, [place], lean beef, lamb - Increase intake of seafood such as tuna, salmon, trout, mackerel, shrimp, scallops - Avoid or limit high fat processed meats such as sausage and bacon  Dairy - Include moderate amounts of low fat dairy products  - Focus on healthy dairy such as fat free yogurt, skim milk, low or reduced fat cheese - Limit dairy products higher in fat such as whole or 2% milk, cheese, ice cream  Alcohol - Moderate amounts of red wine is ok  - No more than 5 oz daily for women (all ages) and men older than age 8  - No more than 10 oz of wine daily for men younger than 48  Other - Limit sweets and other desserts  - Use herbs and spices instead of salt to flavor foods  - Herbs and spices common to the traditional  Mediterranean Diet include: basil, bay leaves, chives, cloves, cumin, fennel, garlic, lavender, marjoram, mint, oregano, parsley, pepper, rosemary, sage, savory, sumac, tarragon, thyme   It's not just a diet, it's a lifestyle:  . The Mediterranean diet includes lifestyle factors typical of those in the region  . Foods, drinks and meals are best eaten with others and savored . Daily physical activity is important for overall good health . This could be strenuous exercise like running and aerobics . This could also be more leisurely activities such as walking, housework, yard-work, or taking the stairs . Moderation is the key; a balanced and healthy diet accommodates most foods and drinks . Consider portion sizes and frequency of consumption of certain foods   Meal Ideas & Options:  . Breakfast:  o Whole wheat toast or whole wheat English muffins with peanut  butter & hard boiled egg o Steel cut oats topped with apples & cinnamon and skim milk  o Fresh fruit: banana, strawberries, melon, berries, peaches  o Smoothies: strawberries, bananas, greek yogurt, peanut butter o Low fat greek yogurt with blueberries and granola  o Egg white omelet with spinach and mushrooms o Breakfast couscous: whole wheat couscous, apricots, skim milk, cranberries  . Sandwiches:  o Hummus and grilled vegetables (peppers, zucchini, squash) on whole wheat bread   o Grilled chicken on whole wheat pita with lettuce, tomatoes, cucumbers or tzatziki  o Tuna salad on whole wheat bread: tuna salad made with greek yogurt, olives, red peppers, capers, green onions o Garlic rosemary lamb pita: lamb sauted with garlic, rosemary, salt & pepper; add lettuce, cucumber, greek yogurt to pita - flavor with lemon juice and black pepper  . Seafood:  o Mediterranean grilled salmon, seasoned with garlic, basil, parsley, lemon juice and black pepper o Shrimp, lemon, and spinach whole-grain pasta salad made with low fat greek yogurt  o Seared scallops with lemon orzo  o Seared tuna steaks seasoned salt, pepper, coriander topped with tomato mixture of olives, tomatoes, olive oil, minced garlic, parsley, green onions and cappers  . Meats:  o Herbed greek chicken salad with kalamata olives, cucumber, feta  o Red bell peppers stuffed with spinach, bulgur, lean ground beef (or lentils) & topped with feta   o Kebabs: skewers of chicken, tomatoes, onions, zucchini, squash  o [place] burgers: made with red onions, mint, dill, lemon juice, feta cheese topped with roasted red peppers . Vegetarian o Cucumber salad: cucumbers, artichoke hearts, celery, red onion, feta cheese, tossed in olive oil & lemon juice  o Hummus and whole grain pita points with a greek salad (lettuce, tomato, feta, olives, cucumbers, red onion) o Lentil soup with celery, carrots made with vegetable broth, garlic, salt and pepper   o Tabouli salad: parsley, bulgur, mint, scallions, cucumbers, tomato, radishes, lemon juice, olive oil, salt and pepper.

## 2020-04-03 NOTE — Chronic Care Management (AMB) (Signed)
Chronic Care Management Pharmacy  Name: Patrick [name]  MRN: [id number] DOB: 09-Jun-1936  Chief Complaint/ HPI  Patrick [name],  84 y.o. , male presents for their Initial CCM visit with the clinical pharmacist via telephone.  PCP : Venia Carbon, MD  Their chronic conditions include: HTN, GERD, T2DM, mild cognitive impairment, hyperlipidemia, mood disorder, tremor  Patient concerns: denies medication concerns  Office Visits:  03/07/20: PCP Silvio Pate) - some relief for panic type symptoms and vertigo with alprazolam; ongoing pain in feet and legs - previous relief with gabapentin but discontinued, felt sleepy in the morning; checking sugars every morning (83-178), no hypoglycemia, acceptable control/check A1c, mild tremor at times, no rx for now, bp controlled ->  Cont current meds and restart low dose gabapentin for neuropathy  09/07/19: PCP Silvio Pate) - fungal infection on back, try weekly fluconazole  Consult Visit: none in past 6 months  Allergies  Allergen Reactions   Gabapentin Other (See Comments)    Dizziness, lightheaded, nausea   Medications: Outpatient Encounter Medications as of 04/03/2020  Medication Sig   Ascorbic Acid (VITAMIN C) 1000 MG tablet Take 1,000 mg by mouth daily.   aspirin 81 MG tablet Take 81 mg by mouth daily.     Blood Glucose Monitoring Suppl (ONE TOUCH ULTRA 2) w/Device KIT SMARTSIG:1 Each Via Meter As Directed   cholecalciferol (VITAMIN D) 1000 units tablet Take 1,000 Units by mouth daily.   fish oil-omega-3 fatty acids 1000 MG capsule Take 2 g by mouth daily.     gabapentin (NEURONTIN) 100 MG capsule Take 1-2 capsules (100-200 mg total) by mouth at bedtime.   glimepiride (AMARYL) 4 MG tablet TAKE 1 TABLET BY MOUTH TWICE A DAY FOR DIABETES   glucose blood (ONE TOUCH ULTRA TEST) test strip USE TO TEST BLOOD SUGAR TWICE DAILY. Dx Code E11.49   latanoprost (XALATAN) 0.005 % ophthalmic solution Place 1 drop into both eyes Daily.    lisinopril (ZESTRIL) 40 MG tablet TAKE 1 TABLET BY MOUTH EVERY MORNING FORBLOOD PRESSURE   metFORMIN (GLUCOPHAGE) 1000 MG tablet TAKE 1 TABLET BY MOUTH BEFORE BREAKFAST AND TAKE 1 TABLET BEFORE DINNER AND 1/2 TAB AT BEDTIME FOR DIABETES   ONETOUCH DELICA LANCETS 50T MISC 1 Units by Does not apply route as needed. Use one to obtain blood sugar. Dx  E11.49   verapamil (CALAN-SR) 240 MG CR tablet TAKE 1 TABLET BY MOUTH EVERY MORNING FORBLOOD PRESSURE   vitamin E 400 UNIT capsule Take 400 Units by mouth daily.   ALPRAZolam (XANAX) 1 MG tablet TAKE 1 TABLET BY MOUTH TWICE (2) DAILY AS NEEDED (Patient not taking: Reported on 04/03/2020)   desonide (DESOWEN) 0.05 % lotion APPLY TO AFFECTED AREA ON FACE TWICE DAILY (Patient not taking: Reported on 04/03/2020)   fluconazole (DIFLUCAN) 150 MG tablet Take 1 tablet (150 mg total) by mouth once a week. (Patient not taking: Reported on 03/07/2020)   ketoconazole (NIZORAL) 2 % cream Apply twice daily as needed (Patient not taking: Reported on 04/03/2020)   No facility-administered encounter medications on file as of 04/03/2020.   Current Diagnosis/Assessment:   [employer]: Low Risk    Difficulty of Paying Living Expenses: Not hard at all   Goals Addressed            This Visit's Progress    Pharmacy Care Plan       CARE PLAN ENTRY  Current Barriers:   Chronic Disease Management support, education, and care coordination needs related to  Hyperlipidemia and Diabetes  Hyperlipidemia  Pharmacist Clinical Goal(s): o Over the next 6 months, patient will work with PharmD and providers to achieve LDL goal < 100, TG < 150, HDL > 40 Lipid Panel     Component Value Date/Time   CHOL 171 03/07/2020 0801   TRIG 132.0 03/07/2020 0801   HDL 35.30 (L) 03/07/2020 0801   CHOLHDL 5 03/07/2020 0801   VLDL 26.4 03/07/2020 0801   LDLCALC 109 (H) 03/07/2020 0801   LDLDIRECT 112.9 12/20/2008 0000   Current regimen:  o Fish oil 1000 mg - 2  capsules daily o Aspirin 81 mg - 1 tablet daily for CV prevention   Interventions: o Consider cholesterol medication to reduce risk of heart attack or stroke  Patient self care activities - Over the next 6 months, patient will: o Continue fish oil daily o Increase exercise as able with goal of 15 minutes per day o Limit cholesterol content in foods o Call if interested in low-dose cholesterol medication o Work with pharmacist to ensure diabetes control to reduce overall CV risk   Diabetes  Pharmacist Clinical Goal(s): o Over the next 6 months, patient will work with PharmD and providers to achieve A1c goal <7%  Current regimen:   Metformin 1000 mg - 1 tablet at breakfast, 1 tablet at noon, 1/2 tablet at 5 PM  Glimepiride 4 mg - 1 tablet twice daily  Interventions: o Discussed dietary choices, patient limits carbohydrates and sugar o Recommend checking blood glucose after meals rather than in the morning to further assess blood glucose control  Patient self care activities - Over the next 6 months, patient will: o Check blood sugar once daily 2 hours after biggest meal of the day, document, and provide at follow up appointment in 6 months o Contact provider with any episodes of hypoglycemia  Initial goal documentation      Hypertension   CMP Latest Ref Rng & Units 03/07/2020 03/16/2019 02/20/2018  Glucose 70 - 99 mg/dL 188(H) 141(H) 166(H)  BUN 6 - 23 mg/dL _0 Creatinine 0.40 - 1.50 mg/dL 1.10 0.95 1.08  Sodium 135 - 145 mEq/L 138 137 139  Potassium 3.5 - 5.1 mEq/L 4.6 4.7 5.3(H)  Chloride 96 - 112 mEq/L 101 100 102  CO2 19 - 32 mEq/L _1 Calcium 8.4 - 10.5 mg/dL 9.0 9.0 9.1  Total Protein 6.0 - 8.3 g/dL 6.9 6.9 7.1  Total Bilirubin 0.2 - 1.2 mg/dL 0.5 0.4 0.5  Alkaline Phos 39 - 117 U/L 47 52 48  AST 0 - 37 U/L _2 ALT 0 - 53 U/L _3 Office blood pressures are: BP Readings from Last 3 Encounters:  03/07/20 126/80  09/07/19 140/88    06/25/19 136/80   Patient has failed these meds in the past: none reported Patient checks BP at home: none, does not have BP monitor Patient home BP readings are ranging: none reported  BP goal < 140/90 mmHg Patient is currently controlled on the following medications:   Lisinopril 40 mg - 1 tablet daily  Verapamil 240 mg CR - 1 tablet daily  We discussed: occasional dizziness when working in the yard/heat, encouraged hydration, shade  Plan: Continue current medications  Hyperlipidemia   Lipid Panel     Component Value Date/Time   CHOL 171 03/07/2020 0801   TRIG 132.0 03/07/2020 0801   HDL 35.30 (L) 03/07/2020 0801   LDLCALC 109 (H) 03/07/2020 0801  LDLDIRECT 112.9 12/20/2008 0000    CBC Latest Ref Rng & Units 03/07/2020 03/16/2019 02/20/2018  WBC 4.0 - 10.5 K/uL 5.4 5.8 5.7  Hemoglobin 13.0 - 17.0 g/dL 14.2 14.6 15.0  Hematocrit 39.0 - 52.0 % 42.0 42.6 43.7  Platelets 150.0 - 400.0 K/uL 218.0 225.0 251.0   The ASCVD Risk score ([place]., et al., 2013) failed to calculate for the following reasons:   The 2013 ASCVD risk score is only valid for ages 66 to 58   LDL goal < 100, HDL > 40, TG < 150 Patient has failed these meds in past: none reported Patient is currently uncontrolled on the following medications:   Fish Oil 1000 mg - 2 capsules daily   Aspirin 81 mg - 1 tablet daily for CV prevention    We discussed: risks/benefits of statin therapy discussed considering age > 85, CV risk factors include DM, HTN; recommend more stringent DM control; pt declines interest in statin today; CBC stable, denies abnormal bruising or bleeding  Plan: Continue current medications; Continue to improve DM control. Limit cholesterol content in foods. Increase exercise as able.   Diabetes   Recent Relevant Labs: Lab Results  Component Value Date/Time   HGBA1C 8.1 (H) 03/07/2020 08:01 AM   HGBA1C 7.7 (A) 09/07/2019 11:40 AM   HGBA1C 7.5 (A) 06/25/2019 09:58 AM   HGBA1C 8.7 (H)  03/16/2019 08:45 AM   MICROALBUR 1.4 05/15/2011 09:01 AM   MICROALBUR 1.1 12/20/2008 12:00 AM    Checking BG: Daily Recent FBG Readings:   6/1 - 87  6/2 - 98  6/3 - 96  6/4 - 126 (does not recall dietary changes)  6/5 - 103  6/6 - 78  6/7 - 148 Average: 102 Denies hypoglycemia in past month  A1c goal < 7% Patient has failed these meds in past: none reported Patient is currently uncontrolled (per A1c) on the following medications:   Metformin 1000 mg - 1 tablet at breakfast, 1 tablet at noon, 1/2 tablet at 5 PM  Glimepiride 4 mg - 1 tablet twice daily  Last diabetic eye exam:  Lab Results  Component Value Date/Time   HMDIABEYEEXA No Retinopathy 08/04/2019 12:00 AM    Last diabetic foot exam:  Lab Results  Component Value Date/Time   HMDIABFOOTEX done 09/07/2019 12:00 AM    We discussed:  Diet - reports trying to avoid bread but enjoys it and its a bad habit; has increased salads, avoids bacon and sausage  Breakfast - 2 eggs, oatmeal, sliced tomatoes, black coffee  Dinner - lean cuisine, turnip greens  Snacks: cantaloupe, can of pork n' beans  Drinks: water, sprite zero (1 glass daily) Reports reading food labels and limiting carbs and sodium   Plan: Continue current medications; Recommend changing time of day of BG checks to 2 hours after meals to better assess post-prandial control.   Peripheral Neuropathy   Patient has failed these meds in past: none reported Patient is currently controlled on the following medications:   Gabapentin 100 mg - 1-2 capsules at bedtime (taking 1 capsule at 1 PM in the afternoon)  We discussed: denies sedation, excessive drowsiness, reports leg pain significantly improved with resuming gabapentin  Plan: Continue current medications   Medication Management  Misc: latanoprost 0.005%  OTCs: vitamin E 400 unit cap daily, vitamin D 1000 IU daily, vitamin C 1000 mg daily  Not taking: Xanax, desonide, fluconazole,  ketoconazole   Pharmacy/Benefits: [facility]  Adherence: refills timely  Affordability: denies  concerns with affordability of meds or supplies, no transportation concerns  CCM Follow Up: 6 months (telephone) 10/04/20 at 1:00 PM   Debbora Dus, PharmD Clinical Pharmacist [facility] [phone]

## 2020-05-01 ENCOUNTER — Other Ambulatory Visit: Payer: Self-pay | Admitting: Internal Medicine

## 2020-05-01 DIAGNOSIS — F39 Unspecified mood [affective] disorder: Secondary | ICD-10-CM

## 2020-05-02 NOTE — Telephone Encounter (Signed)
Last filled 03-30-20 #60 Last OV 03-07-20 Next OV 09-19-20 [place]

## 2020-05-29 ENCOUNTER — Other Ambulatory Visit: Payer: Self-pay | Admitting: Internal Medicine

## 2020-05-29 DIAGNOSIS — F39 Unspecified mood [affective] disorder: Secondary | ICD-10-CM

## 2020-05-29 NOTE — Telephone Encounter (Signed)
Last office visit 03/07/2020 for 6 month follow up.  Last refilled 05/02/2020 for #60 with no refills.  CPE scheduled for 09/19/2020.

## 2020-05-30 ENCOUNTER — Other Ambulatory Visit: Payer: Self-pay

## 2020-05-30 MED ORDER — GLUCOSE BLOOD VI STRP: ORAL_STRIP | 3 refills | Status: DC

## 2020-06-22 ENCOUNTER — Other Ambulatory Visit: Payer: Self-pay | Admitting: Internal Medicine

## 2020-06-22 DIAGNOSIS — F39 Unspecified mood [affective] disorder: Secondary | ICD-10-CM

## 2020-06-22 NOTE — Telephone Encounter (Signed)
Last prescribed on 05/29/2020 #60 Last OV (follow up) with Dr Silvio Pate on 03/07/2020 Future OV scheduled on 09/19/2020

## 2020-07-24 ENCOUNTER — Other Ambulatory Visit: Payer: Self-pay | Admitting: Internal Medicine

## 2020-07-24 DIAGNOSIS — F39 Unspecified mood [affective] disorder: Secondary | ICD-10-CM

## 2020-07-24 NOTE — Telephone Encounter (Signed)
Last filled 06-22-20 (he always asks a few days early to make sure they have it in stock) Last OV 03-07-20 Next OV 09-19-20 [place]

## 2020-08-03 DIAGNOSIS — H401131 Primary open-angle glaucoma, bilateral, mild stage: Secondary | ICD-10-CM | POA: Diagnosis not present

## 2020-08-03 LAB — HM DIABETES FOOT EXAM

## 2020-08-09 LAB — HM DIABETES EYE EXAM

## 2020-08-23 ENCOUNTER — Other Ambulatory Visit: Payer: Self-pay | Admitting: Internal Medicine

## 2020-08-23 ENCOUNTER — Ambulatory Visit: Payer: PPO | Admitting: Dermatology

## 2020-08-23 ENCOUNTER — Other Ambulatory Visit: Payer: Self-pay

## 2020-08-23 DIAGNOSIS — F39 Unspecified mood [affective] disorder: Secondary | ICD-10-CM

## 2020-08-23 DIAGNOSIS — L219 Seborrheic dermatitis, unspecified: Secondary | ICD-10-CM | POA: Diagnosis not present

## 2020-08-23 DIAGNOSIS — R21 Rash and other nonspecific skin eruption: Secondary | ICD-10-CM | POA: Diagnosis not present

## 2020-08-23 DIAGNOSIS — S1180XA Unspecified open wound of other specified part of neck, initial encounter: Secondary | ICD-10-CM | POA: Diagnosis not present

## 2020-08-23 DIAGNOSIS — T148XXA Other injury of unspecified body region, initial encounter: Secondary | ICD-10-CM

## 2020-08-23 MED ORDER — MUPIROCIN 2 % EX OINT: 1.0000 "application " | TOPICAL_OINTMENT | Freq: Every day | CUTANEOUS | 0 refills | Status: DC

## 2020-08-23 MED ORDER — BETAMETHASONE DIPROPIONATE 0.05 % EX OINT: TOPICAL_OINTMENT | Freq: Two times a day (BID) | CUTANEOUS | 0 refills | Status: DC | PRN

## 2020-08-23 MED ORDER — HYDROCORTISONE 2.5 % EX CREA: TOPICAL_CREAM | Freq: Two times a day (BID) | CUTANEOUS | 11 refills | Status: DC | PRN

## 2020-08-23 MED ORDER — KETOCONAZOLE 2 % EX SHAM: MEDICATED_SHAMPOO | CUTANEOUS | 0 refills | Status: DC

## 2020-08-23 NOTE — Telephone Encounter (Signed)
Last filled 07-24-20 #60 Last OV 03-07-20 Next OV 09-19-20 [place]

## 2020-08-23 NOTE — Progress Notes (Signed)
   New Patient Visit  Subjective  Patrick [name] is a 84 y.o. male who presents for the following: Rash (left arm, chest, buttocks, and abdomen).  He reports this rash appeared within the last 1 to 2 weeks.  It is irritated rather than itchy.  He has not used any prescription treatments for this.  He also has a dry scaly scalp despite trying many different shampoos.  Objective  Well appearing patient in no apparent distress; mood and affect are within normal limits.  A focused examination was performed including scalp, face, neck, arms, ears, buttocks, back, hands, fingernails, abdomen. Relevant physical exam findings are noted in the Assessment and Plan.  Objective  Left Forearm - Anterior: Scaly pink plaques on arms, neck, chin, buttocks, low abd  symmetric scaly red plaques with central maceration at glutteal crease  Objective  left posterior neck: Open wound with crust  Objective  Scalp: Thick scale and erythema of scalp  Assessment & Plan  Rash Left Forearm - Anterior  Examination most consistent with psoriasis.  Patient denies joint pain.  Will order ASO titer to assess for strep infection.  Start betamethasome dipropionate ointment twice a day as needed - Use on all areas except buttock crease. Avoid applying to face, groin, and axilla. Use as directed. Risk of skin atrophy with long-term use reviewed.   Use Hydrocortisone 2.5% ointment twice a day as needed on buttock crease.   Other Related Procedures Antistreptolysin O titer  Ordered Medications: betamethasone dipropionate (DIPROLENE) 0.05 % ointment hydrocortisone 2.5 % cream  Open wound left posterior neck  Apply mupirocin 2% ointment to open would daily.  Recheck at follow-up.  Ordered Medications: mupirocin ointment (BACTROBAN) 2 %  Seborrheic dermatitis Scalp  Chronic, flared He has tried many different shampoos  Use ketoconazole shampoo apply three times per week, massage into scalp and  leave in for 10 minutes before rinsing out Recommend alternating with over-the-counter T-sal shampoo  Return in about 2 weeks (around 09/06/2020).   I Harriett Sine scribed for Alfonso Patten, MD.  Documentation: I have reviewed the above documentation for accuracy and completeness, and I agree with the above.  Forest Gleason, MD

## 2020-09-06 ENCOUNTER — Other Ambulatory Visit: Payer: Self-pay

## 2020-09-06 ENCOUNTER — Ambulatory Visit: Payer: PPO | Admitting: Dermatology

## 2020-09-06 DIAGNOSIS — T148XXA Other injury of unspecified body region, initial encounter: Secondary | ICD-10-CM

## 2020-09-06 DIAGNOSIS — B079 Viral wart, unspecified: Secondary | ICD-10-CM | POA: Diagnosis not present

## 2020-09-06 DIAGNOSIS — R21 Rash and other nonspecific skin eruption: Secondary | ICD-10-CM

## 2020-09-06 DIAGNOSIS — L98491 Non-pressure chronic ulcer of skin of other sites limited to breakdown of skin: Secondary | ICD-10-CM | POA: Diagnosis not present

## 2020-09-06 DIAGNOSIS — S31809A Unspecified open wound of unspecified buttock, initial encounter: Secondary | ICD-10-CM

## 2020-09-06 DIAGNOSIS — L409 Psoriasis, unspecified: Secondary | ICD-10-CM

## 2020-09-06 MED ORDER — HYDROCORTISONE 2.5 % EX CREA: TOPICAL_CREAM | Freq: Two times a day (BID) | CUTANEOUS | 2 refills | Status: DC | PRN

## 2020-09-06 MED ORDER — BETAMETHASONE DIPROPIONATE 0.05 % EX OINT: TOPICAL_OINTMENT | Freq: Two times a day (BID) | CUTANEOUS | 0 refills | Status: DC | PRN

## 2020-09-06 MED ORDER — MUPIROCIN 2 % EX OINT: 1.0000 "application " | TOPICAL_OINTMENT | Freq: Three times a day (TID) | CUTANEOUS | 2 refills | Status: DC

## 2020-09-06 NOTE — Progress Notes (Addendum)
   Follow-Up Visit   Subjective  Patrick [name] is a 84 y.o. male who presents for the following: Follow-up (Patient here today for 2 week psoriasis follow up. ).  Psoriasis is at arms, neck, chin, buttocks and low abdomen. He is using betamethasone diproprionate and HC 2.5% cream. Patient advises the only area he has not had much improvement is at the buttocks area. Patient also has a spot at left lower side that gets irritated.  The following portions of the chart were reviewed this encounter and updated as appropriate:  Tobacco  Allergies  Meds  Problems  Med Hx  Surg Hx  Fam Hx      Review of Systems:  No other skin or systemic complaints except as noted in HPI or Assessment and Plan.  Objective  Well appearing patient in no apparent distress; mood and affect are within normal limits.  A focused examination was performed including face, neck, chest and back and buttocks and arms. Relevant physical exam findings are noted in the Assessment and Plan.  Objective  back, chest, arms: Scaly pink plaques right upper back, right clavicle, right flank, improved  Objective  Left lower back: Verrucous papules  Objective  Gluteal Crease: Superficial ulcerations   Assessment & Plan  Psoriasis back, chest, arms  Improving Reviewed chronic nature.   Will defer ASO titer since Medicare apparently will not pay for it.   Continue hydrocortisone 2.5% cream to buttocks crease twice daily Continue betamethasone ointment to other areas twice daily avoiding groin, face and underarms.  No joint pain.   Viral warts, unspecified type Left lower back  Discussed viral etiology and risk of spread.  Discussed multiple treatments may be required to clear warts.  Discussed possible post-treatment dyspigmentation and risk of recurrence.   Destruction of lesion - Left lower back  Destruction method: cryotherapy   Informed consent: discussed and consent obtained   Lesion destroyed  using liquid nitrogen: Yes   Cryotherapy cycles:  2 Outcome: patient tolerated procedure well with no complications   Post-procedure details: wound care instructions given    Non-pressure chronic ulcer of skin of other sites limited to breakdown of skin (HCC) Gluteal Crease  Start mupirocin ointment three times daily to open areas at buttocks crease  Ordered Medications: mupirocin ointment (BACTROBAN) 2 %  Open wound at gluteal crease  Other Related Medications mupirocin ointment (BACTROBAN) 2 %  Rash  Reordered Medications hydrocortisone 2.5 % cream betamethasone dipropionate (DIPROLENE) 0.05 % ointment  Return in about 2 weeks (around 09/20/2020).  Graciella Belton, RMA, am acting as scribe for Forest Gleason, MD .  Documentation: I have reviewed the above documentation for accuracy and completeness, and I agree with the above.  Forest Gleason, MD

## 2020-09-06 NOTE — Patient Instructions (Addendum)
Cryotherapy Aftercare  . Wash gently with soap and water everyday.   Marland Kitchen Apply Vaseline and Band-Aid daily until healed.  Prior to procedure, discussed risks of blister formation, small wound, skin dyspigmentation, or rare scar following cryotherapy.   Continue hydrocortisone 2.5% cream to buttocks crease twice daily Continue betamethasone ointment to other areas twice daily avoiding groin, face and underarms.  Start mupirocin ointment to open, burning areas at buttocks crease three times daily.

## 2020-09-18 ENCOUNTER — Encounter: Payer: Self-pay | Admitting: Dermatology

## 2020-09-19 ENCOUNTER — Encounter: Payer: Self-pay | Admitting: Internal Medicine

## 2020-09-19 ENCOUNTER — Other Ambulatory Visit: Payer: Self-pay

## 2020-09-19 ENCOUNTER — Ambulatory Visit (INDEPENDENT_AMBULATORY_CARE_PROVIDER_SITE_OTHER): Payer: PPO | Admitting: Internal Medicine

## 2020-09-19 VITALS — BP 124/78 | HR 68 | Temp 97.3°F | Ht 66.5 in | Wt 183.0 lb

## 2020-09-19 DIAGNOSIS — E1142 Type 2 diabetes mellitus with diabetic polyneuropathy: Secondary | ICD-10-CM

## 2020-09-19 DIAGNOSIS — I10 Essential (primary) hypertension: Secondary | ICD-10-CM

## 2020-09-19 DIAGNOSIS — Z7189 Other specified counseling: Secondary | ICD-10-CM | POA: Diagnosis not present

## 2020-09-19 DIAGNOSIS — S069X0S Unspecified intracranial injury without loss of consciousness, sequela: Secondary | ICD-10-CM | POA: Diagnosis not present

## 2020-09-19 DIAGNOSIS — K219 Gastro-esophageal reflux disease without esophagitis: Secondary | ICD-10-CM

## 2020-09-19 DIAGNOSIS — F39 Unspecified mood [affective] disorder: Secondary | ICD-10-CM | POA: Diagnosis not present

## 2020-09-19 DIAGNOSIS — Z0000 Encounter for general adult medical examination without abnormal findings: Secondary | ICD-10-CM | POA: Diagnosis not present

## 2020-09-19 LAB — POCT GLYCOSYLATED HEMOGLOBIN (HGB A1C): Hemoglobin A1C: 7.1 % — AB (ref 4.0–5.6)

## 2020-09-19 NOTE — Progress Notes (Signed)
Subjective:    Patient ID: Patrick [name], male    DOB: September 23, 1936, 84 y.o.   MRN: [id number]  HPI Here for Medicare wellness visit and follow up of chronic health conditions This visit occurred during the SARS-CoV-2 public health emergency.  Safety protocols were in place, including screening questions prior to the visit, additional usage of staff PPE, and extensive cleaning of exam room while observing appropriate contact time as indicated for disinfecting solutions.   Reviewed form and advanced directives Reviewed other doctors No alcohol or tobacco Continues to do a lot of outdoor yard work--but no set exercise No falls No real depression or anhedonia Vision and hearing are okay Independent with instrumental ADLs Mild memory issues  Has had outbreak of psoriasis---neck, buttocks, etc Given 2 different cortisone strengths and mupirocin to open area on neck Not really helping--but is going back tomorrow  AM sugars are good--- under 130 for the most part In the afternoon--Patrick [name] can tell it goes up (Patrick [name] gets dizzy)- up to 180-200 Eating regularly--and avoiding sweets Gabapentin does help the neuropathy/burning in feet---bedtime but skips doses at times  Has noted some ED Wonders about medication---Patrick [name] has never tried Discussed ---Patrick [name] wants to hold off  Nerves still act up at times---will get rare panic attack now Not as often Xanax does help  Patrick [name] will get vertigo if Patrick [name] [place] the [place]---Patrick [name] avoids Memory seems better now---just has the dizziness  No chest pain No SOB No palpitations unless Patrick [name] pushes hard with his outside work Edema is not as obvious---happens when out working for a while. Better with elevation  Some acid reflux symptoms Depends on diet Will use gaviscon prn No dysphagia  Current Outpatient Medications on File Prior to Visit  Medication Sig Dispense Refill  . ALPRAZolam (XANAX) 1 MG tablet TAKE 1 TABLET BY MOUTH TWICE (2) DAILY AS NEEDED 60  tablet 0  . Ascorbic Acid (VITAMIN C) 1000 MG tablet Take 1,000 mg by mouth daily.    Marland Kitchen aspirin 81 MG tablet Take 81 mg by mouth daily.      . betamethasone dipropionate (DIPROLENE) 0.05 % ointment Apply topically 2 (two) times daily as needed (Rash). 45 g 0  . Blood Glucose Monitoring Suppl (ONE TOUCH ULTRA 2) w/Device KIT SMARTSIG:1 Each Via Meter As Directed    . cholecalciferol (VITAMIN D) 1000 units tablet Take 1,000 Units by mouth daily.    Marland Kitchen desonide (DESOWEN) 0.05 % lotion APPLY TO AFFECTED AREA ON FACE TWICE DAILY 118 mL 0  . fish oil-omega-3 fatty acids 1000 MG capsule Take 2 g by mouth daily.      . fluconazole (DIFLUCAN) 150 MG tablet Take 1 tablet (150 mg total) by mouth once a week. 4 tablet 2  . gabapentin (NEURONTIN) 100 MG capsule Take 1-2 capsules (100-200 mg total) by mouth at bedtime. 60 capsule 11  . glimepiride (AMARYL) 4 MG tablet TAKE 1 TABLET BY MOUTH TWICE A DAY FOR DIABETES 180 tablet 3  . glucose blood (ONE TOUCH ULTRA TEST) test strip USE TO TEST BLOOD SUGAR TWICE DAILY. Dx Code E11.49 100 each 3  . hydrocortisone 2.5 % cream Apply topically 2 (two) times daily as needed (Rash). 30 g 2  . ketoconazole (NIZORAL) 2 % cream Apply twice daily as needed 60 g 1  . ketoconazole (NIZORAL) 2 % shampoo Apply to scalp 3 times per week. Leave in for a for minutes before rinsing. 120 mL 0  . latanoprost (XALATAN) 0.005 % ophthalmic  solution Place 1 drop into both eyes Daily.    Marland Kitchen lisinopril (ZESTRIL) 40 MG tablet TAKE 1 TABLET BY MOUTH EVERY MORNING FORBLOOD PRESSURE 90 tablet 3  . metFORMIN (GLUCOPHAGE) 1000 MG tablet TAKE 1 TABLETS BY MOUTH BEFORE BREAKFASTAND THEN 1 TABLET BEFORE DINNER AND 1/2 TABLET AT BEDTIME FOR DIABETES. 235 tablet 3  . mupirocin ointment (BACTROBAN) 2 % Apply 1 application topically 3 (three) times daily. Apply to open areas buttocks crease as directed 22 g 2  . ONETOUCH DELICA LANCETS 59D MISC 1 Units by Does not apply route as needed. Use one to obtain  blood sugar. Dx  E11.49 100 each 3  . verapamil (CALAN-SR) 240 MG CR tablet TAKE 1 TABLET BY MOUTH EVERY MORNING FORBLOOD PRESSURE 90 tablet 3  . vitamin E 400 UNIT capsule Take 400 Units by mouth daily.     No current facility-administered medications on file prior to visit.    Allergies  Allergen Reactions  . Gabapentin Other (See Comments)    Dizziness, lightheaded, nausea    Past Medical History:  Diagnosis Date  . Anxiety   . Diabetes mellitus   . GERD (gastroesophageal reflux disease)   . Hyperlipidemia   . Hypertension   . Irregular heartbeat    Dr. (his) stated that it was "nothing to worry about."  . Peptic ulcer disease   . Renal cyst   . Rosacea     Past Surgical History:  Procedure Laterality Date  . CHOLECYSTECTOMY    . KNEE ARTHROSCOPY     left  . PARTIAL GASTRECTOMY  1974    Family History  Problem Relation Age of Onset  . Cancer Brother        prostate  . Colon cancer Brother   . Kidney failure Brother   . Cancer Sister   . Heart disease Brother   . Cancer Brother        prostate cancer  . Cancer Brother        prostate    Social History   Socioeconomic History  . Marital status: Widowed    Spouse name: Not on file  . Number of children: 2  . Years of education: Not on file  . Highest education level: Not on file  Occupational History  . Occupation: RETIRED, Programmer, systems: RETIRED  Tobacco Use  . Smoking status: Never Smoker  . Smokeless tobacco: Never Used  Substance and Sexual Activity  . Alcohol use: No  . Drug use: Not on file  . Sexual activity: Not on file  Other Topics Concern  . Not on file  Social History Narrative   Now has living will   Son is health care POA--no alternate   Would accept resuscitation attempts   No tube feeds if cognitively unaware   Social Determinants of Health   Financial Resource Strain: Low Risk   . Difficulty of Paying Living Expenses: Not hard at all  Food Insecurity:   .  Worried About Charity fundraiser in the Last Year: Not on file  . Ran Out of Food in the Last Year: Not on file  Transportation Needs:   . Lack of Transportation (Medical): Not on file  . Lack of Transportation (Non-Medical): Not on file  Physical Activity:   . Days of Exercise per Week: Not on file  . Minutes of Exercise per Session: Not on file  Stress:   . Feeling of Stress : Not on file  Social Connections:   .  Frequency of Communication with Friends and Family: Not on file  . Frequency of Social Gatherings with Friends and Family: Not on file  . Attends Religious Services: Not on file  . Active Member of Clubs or Organizations: Not on file  . Attends Archivist Meetings: Not on file  . Marital Status: Not on file  Intimate Partner Violence:   . Fear of Current or Ex-Partner: Not on file  . Emotionally Abused: Not on file  . Physically Abused: Not on file  . Sexually Abused: Not on file   Review of Systems Appetite is good Has lost a few pounds Sleeping okay now Wears seat belt Teeth are okay---recent cleaning Bowels fine---no blood Voids okay---does have increased urgency in the evening. Flow is fine No sig back or joint pains    Objective:   Physical Exam Constitutional:      Appearance: Normal appearance.  HENT:     Mouth/Throat:     Comments: No lesions Eyes:     Conjunctiva/sclera: Conjunctivae normal.     Pupils: Pupils are equal, round, and reactive to light.  Cardiovascular:     Rate and Rhythm: Normal rate and regular rhythm.     Pulses: Normal pulses.     Heart sounds: No murmur heard.  No gallop.   Pulmonary:     Effort: Pulmonary effort is normal.     Breath sounds: Normal breath sounds. No wheezing or rales.  Abdominal:     Palpations: Abdomen is soft.     Tenderness: There is no abdominal tenderness.  Musculoskeletal:     Cervical back: Neck supple.     Comments: Very slight edema in feet only  Lymphadenopathy:     Cervical: No  cervical adenopathy.  Skin:    Comments: No foot lesions  Neurological:     Mental Status: Patrick [name] is alert and oriented to person, place, and time.     Comments: President--- "Obama---the one now was vice president, the one I liked is gone" [phone] [id number] Recall 1/3  Mild decreased sensation in feet  Psychiatric:        Mood and Affect: Mood normal.        Behavior: Behavior normal.            Assessment & Plan:

## 2020-09-19 NOTE — Assessment & Plan Note (Signed)
See social history 

## 2020-09-19 NOTE — Assessment & Plan Note (Signed)
I have personally reviewed the Medicare Annual Wellness questionnaire and have noted 1. The patient's medical and social history 2. Their use of alcohol, tobacco or illicit drugs 3. Their current medications and supplements 4. The patient's functional ability including ADL's, fall risks, home safety risks and hearing or visual             impairment. 5. Diet and physical activities 6. Evidence for depression or mood disorders  The patients weight, height, BMI and visual acuity have been recorded in the chart I have made referrals, counseling and provided education to the patient based review of the above and I have provided the pt with a written personalized care plan for preventive services.  I have provided you with a copy of your personalized plan for preventive services. Please take the time to review along with your updated medication list.  Yearly flu vaccine Needs COVID booster Discussed exercise Hold off on ED meds No cancer screening due to age

## 2020-09-19 NOTE — Assessment & Plan Note (Signed)
Has stable cognitive deficits and some dizziness (especially after exertion) No functional changes

## 2020-09-19 NOTE — Assessment & Plan Note (Addendum)
Seems to have reasonable control Hopefully back under 8% No clear hypoglycemia Foot burning better with gabapentin On glimepiride and metformin  Lab Results  Component Value Date   HGBA1C 7.1 (A) 09/19/2020   Much better  No changes needed

## 2020-09-19 NOTE — Assessment & Plan Note (Signed)
BP Readings from Last 3 Encounters:  09/19/20 124/78  03/07/20 126/80  09/07/19 140/88   Fine on lisinopril and verapamil If edema worsens, would try changing the verapamil

## 2020-09-19 NOTE — Progress Notes (Signed)
Hearing Screening   125Hz  250Hz  500Hz  1000Hz  2000Hz  3000Hz  4000Hz  6000Hz  8000Hz   Right ear:   20 20 20  20     Left ear:   20 20 20  25     Vision Screening Comments: October 2021

## 2020-09-19 NOTE — Assessment & Plan Note (Signed)
Panic spells are less common Uses the xanax rarely

## 2020-09-19 NOTE — Assessment & Plan Note (Signed)
Just uses prn gaviscon

## 2020-09-20 ENCOUNTER — Ambulatory Visit (INDEPENDENT_AMBULATORY_CARE_PROVIDER_SITE_OTHER): Payer: PPO | Admitting: Dermatology

## 2020-09-20 ENCOUNTER — Other Ambulatory Visit: Payer: Self-pay

## 2020-09-20 DIAGNOSIS — L409 Psoriasis, unspecified: Secondary | ICD-10-CM

## 2020-09-20 DIAGNOSIS — L98491 Non-pressure chronic ulcer of skin of other sites limited to breakdown of skin: Secondary | ICD-10-CM

## 2020-09-20 DIAGNOSIS — L820 Inflamed seborrheic keratosis: Secondary | ICD-10-CM

## 2020-09-20 DIAGNOSIS — B078 Other viral warts: Secondary | ICD-10-CM

## 2020-09-20 NOTE — Patient Instructions (Addendum)
Gluteal Crease/buttock   Start mupirocin ointment two times daily to open areas at buttocks crease  Start Vaseline daily during the day to gluteal crease/buttock    Psoriasis  Cont Betamethasone ointment apply to psoriasis on back, chest, arms once twice a day as needed

## 2020-09-20 NOTE — Progress Notes (Signed)
   Follow-Up Visit   Subjective  Patrick [name] is a 84 y.o. male who presents for the following: Psoriasis (2 weeks f/u on Psoriasis on the back, chest and arms treating with Hydrocortisone cream, Betamethasone ointment), Warts (2 weeks f/u on wart L lower back treated with LN2 pt report wart not completely gone away ), and Skin Problem (pt c/o irritated itchy area at the L elbow he would like removed today ).   The following portions of the chart were reviewed this encounter and updated as appropriate:  Tobacco  Allergies  Meds  Problems  Med Hx  Surg Hx  Fam Hx      Review of Systems:  No other skin or systemic complaints except as noted in HPI or Assessment and Plan.  Objective  Well appearing patient in no apparent distress; mood and affect are within normal limits.  A focused examination was performed including face,chest,arms, buttocks . Relevant physical exam findings are noted in the Assessment and Plan.  Objective  Left lower back (2): Verrucous papules  Objective  back,chest, arms: Scaly pink plaques right upper back, right clavicle, right flank, improved    Objective  Left elbow: Erythematous keratotic or waxy stuck-on papule or plaque.   Objective  Gluteal Crease: Superficial ulcerations   Assessment & Plan  Other viral warts (2) Left lower back  Prior to procedure, discussed risks of blister formation, small wound, skin dyspigmentation, or rare scar following cryotherapy.    Destruction of lesion - Left lower back Complexity: simple   Destruction method: cryotherapy   Informed consent: discussed and consent obtained   Timeout:  patient name, date of birth, surgical site, and procedure verified Lesion destroyed using liquid nitrogen: Yes   Region frozen until ice ball extended beyond lesion: Yes   Outcome: patient tolerated procedure well with no complications   Post-procedure details: wound care instructions given    Psoriasis back,chest,  arms  Usually a chronic condition though sometimes triggered by Strep bacteria and then clears. In this case, unclear if it will become chronic. Currently improved    D/C Hydrocortisone cream to gluteal crease, can use prn flares   Cont Betamethasone ointment apply to aa on skin qd-bid prn. Avoid applying to face, groin, and axilla. Use as directed. Risk of skin atrophy with long-term use reviewed.    Inflamed seborrheic keratosis Left elbow  Recheck at follow up consider biopsy if not gone away   Destruction of lesion - Left elbow Complexity: simple   Destruction method: cryotherapy   Informed consent: discussed and consent obtained   Timeout:  patient name, date of birth, surgical site, and procedure verified Lesion destroyed using liquid nitrogen: Yes   Region frozen until ice ball extended beyond lesion: Yes   Outcome: patient tolerated procedure well with no complications   Post-procedure details: wound care instructions given    Ulcer, skin, non-healing, limited to breakdown of skin (HCC) Gluteal Crease  Some improvement from last visit Start mupirocin ointment twice a day to open areas at buttocks crease Start Vaseline daily during the day to gluteal crease   Return in about 3 weeks (around 10/11/2020).  I, Marye Round, CMA, am acting as scribe for Forest Gleason, MD .  Documentation: I have reviewed the above documentation for accuracy and completeness, and I agree with the above.  Forest Gleason, MD

## 2020-09-20 NOTE — Progress Notes (Deleted)
   Follow-Up Visit   Subjective  Patrick [name] is a 84 y.o. male who presents for the following: Psoriasis (2 weeks f/u on Psoriasis on the back, chest and arms treating with Hydrocortisone cream, Betamethasone ointment) and Warts (2 weeks f/u on wart L lower back treated with LN2 pt report wart not completely gone away ).   The following portions of the chart were reviewed this encounter and updated as appropriate:      Review of Systems:  No other skin or systemic complaints except as noted in HPI or Assessment and Plan.  Objective  Well appearing patient in no apparent distress; mood and affect are within normal limits.  {[id number]::"L full examination was performed including scalp, head, eyes, ears, nose, lips, neck, chest, axillae, abdomen, back, buttocks, bilateral upper extremities, bilateral lower extremities, hands, feet, fingers, toes, fingernails, and toenails. All findings within normal limits unless otherwise noted below."}  Objective  Left lower back: Verrucous papules -- Discussed viral etiology and contagion.   Objective  Gluteal Crease: Superficial ulcerations    Assessment & Plan  Other viral warts Left lower back  Destruction of lesion - Left lower back Complexity: simple   Destruction method: cryotherapy   Informed consent: discussed and consent obtained   Timeout:  patient name, date of birth, surgical site, and procedure verified Lesion destroyed using liquid nitrogen: Yes   Region frozen until ice ball extended beyond lesion: Yes   Outcome: patient tolerated procedure well with no complications   Post-procedure details: wound care instructions given    Non-pressure chronic ulcer of skin of other sites limited to breakdown of skin (HCC) Gluteal Crease  Gluteal Crease   Start mupirocin ointment three times daily to open areas at buttocks crease  Other Related Medications mupirocin ointment (BACTROBAN) 2 %   No follow-ups on file.

## 2020-09-25 ENCOUNTER — Other Ambulatory Visit: Payer: Self-pay | Admitting: Internal Medicine

## 2020-09-25 ENCOUNTER — Encounter: Payer: Self-pay | Admitting: Dermatology

## 2020-09-25 DIAGNOSIS — F39 Unspecified mood [affective] disorder: Secondary | ICD-10-CM

## 2020-09-25 NOTE — Telephone Encounter (Signed)
Last filled 08-23-20 #60 Last OV 09-20-20 Next OV 03-20-21 [place]

## 2020-10-04 ENCOUNTER — Telehealth: Payer: PPO

## 2020-10-11 ENCOUNTER — Ambulatory Visit (INDEPENDENT_AMBULATORY_CARE_PROVIDER_SITE_OTHER): Payer: PPO | Admitting: Dermatology

## 2020-10-11 ENCOUNTER — Other Ambulatory Visit: Payer: Self-pay

## 2020-10-11 DIAGNOSIS — L570 Actinic keratosis: Secondary | ICD-10-CM | POA: Diagnosis not present

## 2020-10-11 DIAGNOSIS — B079 Viral wart, unspecified: Secondary | ICD-10-CM | POA: Diagnosis not present

## 2020-10-11 DIAGNOSIS — L98491 Non-pressure chronic ulcer of skin of other sites limited to breakdown of skin: Secondary | ICD-10-CM | POA: Diagnosis not present

## 2020-10-11 DIAGNOSIS — L409 Psoriasis, unspecified: Secondary | ICD-10-CM | POA: Diagnosis not present

## 2020-10-11 NOTE — Progress Notes (Signed)
   Follow-Up Visit   Subjective  Patrick [name] is a 84 y.o. male who presents for the following: Follow-up (Patient here today for follow up psoriasis on back chest and arms. Patient states areas have improved since using betamethasone ointment. Ulcer on gluteal crease. Patient states area is improved, using mupirocin ointment twice daily. Follow up for wart on left lower back, patient states not completely gone but much better. Patient states he has a place on left ear that comes and goes but denies any symptoms. ).  The following portions of the chart were reviewed this encounter and updated as appropriate:   Tobacco  Allergies  Meds  Problems  Med Hx  Surg Hx  Fam Hx      Review of Systems:  No other skin or systemic complaints except as noted in HPI or Assessment and Plan.  Objective  Well appearing patient in no apparent distress; mood and affect are within normal limits.  A focused examination was performed including arms, ears, face, trunk, buttocks. Relevant physical exam findings are noted in the Assessment and Plan.  Objective  Left lower back: Crust removed, area clear of warts  Objective  back, chest, arms: Scaly pink plaque left arm, left neck  Objective  Left Gluteal Crease: Small residual erosion  Objective  Left Ear Helix: Erythematous thin papule with gritty scale.    Assessment & Plan  Viral warts, unspecified type Left lower back  Resolved, observe for recurrence  Psoriasis back, chest, arms  Continue betamethasone twice daily for 2 more weeks to stubborn areas at left neck and left arm then weekends only. Avoid applying to face, groin, and axilla. Use as directed. Risk of skin atrophy with long-term use reviewed.   Denies joint pain.  Topical steroids (such as triamcinolone, fluocinolone, fluocinonide, mometasone, clobetasol, halobetasol, betamethasone, hydrocortisone) can cause thinning and lightening of the skin if they are used for too  long in the same area. Your physician has selected the right strength medicine for your problem and area affected on the body. Please use your medication only as directed by your physician to prevent side effects.    Non-pressure chronic ulcer of skin of other sites limited to breakdown of skin (HCC) Left Gluteal Crease  Continue mupirocin twice daily for 3 more days  Other Related Medications mupirocin ointment (BACTROBAN) 2 %  AK (actinic keratosis) Left Ear Helix  Cryotherapy today Prior to procedure, discussed risks of blister formation, small wound, skin dyspigmentation, or rare scar following cryotherapy.  Consider bx if not resolved  Destruction of lesion - Left Ear Helix Complexity: simple   Destruction method: cryotherapy   Informed consent: discussed and consent obtained   Lesion destroyed using liquid nitrogen: Yes   Cryotherapy cycles:  2 Outcome: patient tolerated procedure well with no complications   Post-procedure details: wound care instructions given    Return in about 4 weeks (around 11/08/2020) for Psoriasis, [place] .  Graciella Belton, RMA, am acting as scribe for Forest Gleason, MD .  Documentation: I have reviewed the above documentation for accuracy and completeness, and I agree with the above.  Forest Gleason, MD

## 2020-10-11 NOTE — Patient Instructions (Addendum)
Cryotherapy Aftercare  . Wash gently with soap and water everyday.   Marland Kitchen Apply Vaseline and Band-Aid daily until healed.  Prior to procedure, discussed risks of blister formation, small wound, skin dyspigmentation, or rare scar following cryotherapy.   Continue mupirocin twice daily to buttocks crease for 3 more days.  Continue betamethasone twice daily for 2 more weeks to stubborn areas at left neck and left arm then weekends only. Avoid applying to face, groin, and axilla. Use as directed. Risk of skin atrophy with long-term use reviewed.   Discontinue hydrocortisone.

## 2020-10-19 ENCOUNTER — Encounter: Payer: Self-pay | Admitting: Dermatology

## 2020-10-25 ENCOUNTER — Other Ambulatory Visit: Payer: Self-pay | Admitting: Internal Medicine

## 2020-10-25 DIAGNOSIS — F39 Unspecified mood [affective] disorder: Secondary | ICD-10-CM

## 2020-10-25 NOTE — Telephone Encounter (Signed)
Last filled 09-25-20 #60 Last OV 09-20-20 Next OV 03-20-21 [facility]

## 2020-11-17 ENCOUNTER — Telehealth: Payer: Self-pay

## 2020-11-17 ENCOUNTER — Ambulatory Visit (INDEPENDENT_AMBULATORY_CARE_PROVIDER_SITE_OTHER): Payer: PPO | Admitting: Family Medicine

## 2020-11-17 ENCOUNTER — Other Ambulatory Visit: Payer: Self-pay

## 2020-11-17 VITALS — BP 130/80 | HR 88 | Temp 98.2°F | Wt 197.2 lb

## 2020-11-17 DIAGNOSIS — M79661 Pain in right lower leg: Secondary | ICD-10-CM | POA: Insufficient documentation

## 2020-11-17 DIAGNOSIS — M7989 Other specified soft tissue disorders: Secondary | ICD-10-CM

## 2020-11-17 MED ORDER — TRAMADOL HCL 50 MG PO TABS
50.0000 mg | ORAL_TABLET | Freq: Four times a day (QID) | ORAL | 0 refills | Status: AC | PRN
Start: 2020-11-17 — End: 2020-11-22

## 2020-11-17 MED ORDER — DOXYCYCLINE HYCLATE 100 MG PO TABS: 100.0000 mg | ORAL_TABLET | Freq: Two times a day (BID) | ORAL | 0 refills | Status: DC

## 2020-11-17 NOTE — Progress Notes (Signed)
Patient ID: Patrick [name], male    DOB: Oct 14, 1936, 85 y.o.   MRN: [id number]  This visit was conducted in person.  BP 130/80   Pulse 88   Temp 98.2 F (36.8 C) (Temporal)   Wt 197 lb 4 oz (89.5 kg)   SpO2 97%   BMI 31.36 kg/m    CC:  Chief Complaint  Patient presents with  . Leg Pain    Right calf with swelling down to foot x 5 days     Subjective:   HPI: Patrick [name] is a 85 y.o. male presenting on 11/17/2020 for Leg Pain (Right calf with swelling down to foot x 5 days )   Elderly male pt of Dr. Alla German  with  Fairly well controlled diabetes and hypertension.  with leg pain.  5 days ago when leaning over when vacuuming.. felt pop in posterior right leg. Pain is in posterior calf and down to ankle.   Both legs are swollen. R>L.  right leg is red and swollen. No fever.  No N/V.  No SOB, no chest pain.    Has now noted blister on the back of right ankle.   Treated with ice and elevation.  Has history of swelling off and on in legs.. but usually goes down     Normal GFR 64, 03/07/2020   Relevant past medical, surgical, family and social history reviewed and updated as indicated. Interim medical history since our last visit reviewed. Allergies and medications reviewed and updated. Outpatient Medications Prior to Visit  Medication Sig Dispense Refill  . ALPRAZolam (XANAX) 1 MG tablet TAKE 1 TABLET BY MOUTH TWICE (2) DAILY AS NEEDED 60 tablet 0  . Ascorbic Acid (VITAMIN C) 1000 MG tablet Take 1,000 mg by mouth daily.    Marland Kitchen aspirin 81 MG tablet Take 81 mg by mouth daily.    . betamethasone dipropionate (DIPROLENE) 0.05 % ointment Apply topically 2 (two) times daily as needed (Rash). 45 g 0  . Blood Glucose Monitoring Suppl (ONE TOUCH ULTRA 2) w/Device KIT SMARTSIG:1 Each Via Meter As Directed    . cholecalciferol (VITAMIN D) 1000 units tablet Take 1,000 Units by mouth daily.    Marland Kitchen desonide (DESOWEN) 0.05 % lotion APPLY TO AFFECTED AREA ON FACE TWICE DAILY  118 mL 0  . fish oil-omega-3 fatty acids 1000 MG capsule Take 2 g by mouth daily.    . fluconazole (DIFLUCAN) 150 MG tablet Take 1 tablet (150 mg total) by mouth once a week. 4 tablet 2  . gabapentin (NEURONTIN) 100 MG capsule Take 1-2 capsules (100-200 mg total) by mouth at bedtime. 60 capsule 11  . glimepiride (AMARYL) 4 MG tablet TAKE 1 TABLET BY MOUTH TWICE A DAY FOR DIABETES 180 tablet 3  . glucose blood (ONE TOUCH ULTRA TEST) test strip USE TO TEST BLOOD SUGAR TWICE DAILY. Dx Code E11.49 100 each 3  . hydrocortisone 2.5 % cream Apply topically 2 (two) times daily as needed (Rash). 30 g 2  . ketoconazole (NIZORAL) 2 % cream Apply twice daily as needed 60 g 1  . ketoconazole (NIZORAL) 2 % shampoo Apply to scalp 3 times per week. Leave in for a for minutes before rinsing. 120 mL 0  . latanoprost (XALATAN) 0.005 % ophthalmic solution Place 1 drop into both eyes Daily.    Marland Kitchen lisinopril (ZESTRIL) 40 MG tablet TAKE 1 TABLET BY MOUTH EVERY MORNING FORBLOOD PRESSURE 90 tablet 3  . metFORMIN (GLUCOPHAGE) 1000 MG tablet TAKE  1 TABLETS BY MOUTH BEFORE BREAKFASTAND THEN 1 TABLET BEFORE DINNER AND 1/2 TABLET AT BEDTIME FOR DIABETES. 235 tablet 3  . mupirocin ointment (BACTROBAN) 2 % Apply 1 application topically 3 (three) times daily. Apply to open areas buttocks crease as directed 22 g 2  . ONETOUCH DELICA LANCETS 41L MISC 1 Units by Does not apply route as needed. Use one to obtain blood sugar. Dx  E11.49 100 each 3  . verapamil (CALAN-SR) 240 MG CR tablet TAKE 1 TABLET BY MOUTH EVERY MORNING FORBLOOD PRESSURE 90 tablet 3  . vitamin E 400 UNIT capsule Take 400 Units by mouth daily.     No facility-administered medications prior to visit.     Per HPI unless specifically indicated in ROS section below Review of Systems  Constitutional: Negative for fatigue and fever.  HENT: Negative for ear pain.   Eyes: Negative for pain.  Respiratory: Negative for cough and shortness of breath.   Cardiovascular:  Positive for leg swelling. Negative for chest pain and palpitations.  Gastrointestinal: Negative for abdominal pain.  Genitourinary: Negative for dysuria.  Musculoskeletal: Negative for arthralgias.  Skin: Positive for rash.  Neurological: Negative for syncope, light-headedness and headaches.  Psychiatric/Behavioral: Negative for dysphoric mood.   Objective:  BP 130/80   Pulse 88   Temp 98.2 F (36.8 C) (Temporal)   Wt 197 lb 4 oz (89.5 kg)   SpO2 97%   BMI 31.36 kg/m   Wt Readings from Last 3 Encounters:  11/17/20 197 lb 4 oz (89.5 kg)  09/19/20 183 lb (83 kg)  03/07/20 187 lb (84.8 kg)      Physical Exam Constitutional:      General: Vital signs are normal.     Appearance: He is well-developed and well-nourished.  HENT:     Head: Normocephalic.     Right Ear: Hearing normal.     Left Ear: Hearing normal.     Nose: Nose normal.     Mouth/Throat:     Mouth: Oropharynx is clear and moist and mucous membranes are normal.  Neck:     Thyroid: No thyroid mass or thyromegaly.     Vascular: No carotid bruit.     Trachea: Trachea normal.  Cardiovascular:     Rate and Rhythm: Normal rate and regular rhythm.     Pulses: Normal pulses.     Heart sounds: Heart sounds not distant. No murmur heard. No friction rub. No gallop.      Comments: No peripheral edema Pulmonary:     Effort: Pulmonary effort is normal. No respiratory distress.     Breath sounds: Normal breath sounds.  Musculoskeletal:     Right knee: Swelling and erythema present. Decreased range of motion.     Left knee: Swelling present. Normal range of motion.     Comments: Both legs are swollen. R>L.  right leg is red and swollen.  Skin:    General: Skin is warm, dry and intact.     Findings: No rash.  Psychiatric:        Mood and Affect: Mood and affect normal.        Speech: Speech normal.        Behavior: Behavior normal.        Thought Content: Thought content normal.       Results for orders placed or  performed in visit on 10/12/20  HM DIABETES EYE EXAM  Result Value Ref Range   HM Diabetic Eye Exam No Retinopathy No Retinopathy  This visit occurred during the SARS-CoV-2 public health emergency.  Safety protocols were in place, including screening questions prior to the visit, additional usage of staff PPE, and extensive cleaning of exam room while observing appropriate contact time as indicated for disinfecting solutions.   COVID 19 screen:  No recent travel or known exposure to COVID19 The patient denies respiratory symptoms of COVID 19 at this time. The importance of social distancing was discussed today.   Assessment and Plan    Problem List Items Addressed This Visit    Pain and swelling of right lower leg - Primary    Possible ruptured baker's cyst, but cellulitis also on differential. Elevate right leg above heart as able. Can ice intermittently behind knee. Eval with labs. Go to [facility] for ultrasound of lower leg: tommorow Can use tramadol for pain as needed. Use ibuprofen for pain until you can pick this up.  Start antibiotics to cover for bacterial infection.  Go to ER if severe pain, shortness of breath or chest pain or redness spreading up leg.       Relevant Orders   CBC with Differential/Platelet (Completed)   Comprehensive metabolic panel (Completed)   D-dimer, quantitative (not at [facility]) (Completed)   Brain natriuretic peptide (Completed)   US Venous Img Lower Unilateral Right (DVT) (Completed)       [name], MD

## 2020-11-17 NOTE — Telephone Encounter (Signed)
Pt came to clinic today requesting an apt for a provider to look at his leg. Pt reports leaning over the couch 2 days ago and feeling a pop in his calf "like I pulled a muscle." He reports thigh, knee, calf and foot swelling starting shortly after the injury. He also reported there were two brown marks right after, one on the back of the calf and one on the back of the knee but they have gone away now. Pt reports it is painful but only describes the pain as sore and cannot really rate it on a scale of 0-10, and also reports itching. Pt also reports he has hx or DM and he is concerned because of that. Advised pt there are no available apts today and he should go to an UC to be assessed. Pt said he could not go and it would be too difficult for him and that he lived alone.  Discussed case with Dr. Diona Browner and she agreed to see pt at end of her schedule or if a pt cancelled or no showed he could be seen sooner. Pt agreed to wait in the lobby and reported he is not in any acute pain. Pt very appreciative.

## 2020-11-17 NOTE — Patient Instructions (Addendum)
Elevate right leg above heart as able. Can ice intermittently behind knee.  I will call with the lab results. Go to [facility] for ultrasound of lower leg: tommorow Can use tramadol for pain as needed. Use ibuprofen for pain until you can pick this up.  Start antibiotics to cover for bacterial infection.  Go to ER if severe pain, shortness of breath or chest pain or redness spreading up leg.

## 2020-11-17 NOTE — Telephone Encounter (Signed)
US of the leg ordered by Dr. Diona Browner today a stat but it was after 5pm. Pt wanted to go home because he did not want to drive in the dark. No availability for [place] at [facility], only at [facility] in [facility]. Pt cannot drive to [employer]. Dr. Diona Browner said She feels that he needs to go to ED if he develops SOB, CP or severe pain in leg>> BUT since we cannot get this done today and he cant drive there tomorrow then she recommends he go to the ED to have this done immediately >> BUT can be done Monday. She also ordered a D-dimer and if that is abnormal she will contact the pt and advise him to go to the ER immediately.  Referral coordinator scheduled pt for 1/24 at [facility], [place] (main hospital entrance) and to arrive at 9:30. Pt can use valet parking.   Contacted pt and advised of apt for Monday. Advised again of ER precautions. Pt very appreciative and verbalized understanding.

## 2020-11-18 ENCOUNTER — Telehealth: Payer: Self-pay | Admitting: Internal Medicine

## 2020-11-18 LAB — CBC WITH DIFFERENTIAL/PLATELET
Absolute Monocytes: 608 cells/uL (ref 200–950)
Basophils Absolute: 31 cells/uL (ref 0–200)
Basophils Relative: 0.4 %
Eosinophils Absolute: 839 cells/uL — ABNORMAL HIGH (ref 15–500)
Eosinophils Relative: 10.9 %
HCT: 40.5 % (ref 38.5–50.0)
Hemoglobin: 13.8 g/dL (ref 13.2–17.1)
Lymphs Abs: 2356 cells/uL (ref 850–3900)
MCH: 32 pg (ref 27.0–33.0)
MCHC: 34.1 g/dL (ref 32.0–36.0)
MCV: 94 fL (ref 80.0–100.0)
MPV: 11 fL (ref 7.5–12.5)
Monocytes Relative: 7.9 %
Neutro Abs: 3865 cells/uL (ref 1500–7800)
Neutrophils Relative %: 50.2 %
Platelets: 282 10*3/uL (ref 140–400)
RBC: 4.31 10*6/uL (ref 4.20–5.80)
RDW: 12.3 % (ref 11.0–15.0)
Total Lymphocyte: 30.6 %
WBC: 7.7 10*3/uL (ref 3.8–10.8)

## 2020-11-18 LAB — COMPREHENSIVE METABOLIC PANEL
AG Ratio: 1.7 (calc) (ref 1.0–2.5)
ALT: 20 U/L (ref 9–46)
AST: 17 U/L (ref 10–35)
Albumin: 4.3 g/dL (ref 3.6–5.1)
Alkaline phosphatase (APISO): 62 U/L (ref 35–144)
BUN/Creatinine Ratio: 20 (calc) (ref 6–22)
BUN: 25 mg/dL (ref 7–25)
CO2: 23 mmol/L (ref 20–32)
Calcium: 9.1 mg/dL (ref 8.6–10.3)
Chloride: 104 mmol/L (ref 98–110)
Creat: 1.27 mg/dL — ABNORMAL HIGH (ref 0.70–1.11)
Globulin: 2.5 g/dL (calc) (ref 1.9–3.7)
Glucose, Bld: 155 mg/dL — ABNORMAL HIGH (ref 65–99)
Potassium: 5.2 mmol/L (ref 3.5–5.3)
Sodium: 138 mmol/L (ref 135–146)
Total Bilirubin: 0.3 mg/dL (ref 0.2–1.2)
Total Protein: 6.8 g/dL (ref 6.1–8.1)

## 2020-11-18 LAB — D-DIMER, QUANTITATIVE: D-Dimer, Quant: 1.61 mcg/mL FEU — ABNORMAL HIGH (ref <0.50)

## 2020-11-18 LAB — BRAIN NATRIURETIC PEPTIDE: Brain Natriuretic Peptide: 73 pg/mL (ref <100)

## 2020-11-18 NOTE — Telephone Encounter (Signed)
Notified by [facility] of critical value--elevated d-dimer. Called patient -he already has doppler set up for Monday morning at 9:30. He remembers bending over couch and felt something snap--then the pain and swelling. With that history, it makes DVT much less likely but should go ahead with the ultrasound--especially in view of the positive d-dimer

## 2020-11-20 ENCOUNTER — Other Ambulatory Visit: Payer: Self-pay

## 2020-11-20 ENCOUNTER — Ambulatory Visit
Admission: RE | Admit: 2020-11-20 | Discharge: 2020-11-20 | Disposition: A | Discharge status: Home / Self Care | Payer: PPO | Source: Ambulatory Visit | Attending: Family Medicine | Admitting: Family Medicine

## 2020-11-20 ENCOUNTER — Telehealth: Payer: Self-pay

## 2020-11-20 DIAGNOSIS — R600 Localized edema: Secondary | ICD-10-CM | POA: Diagnosis not present

## 2020-11-20 DIAGNOSIS — M79661 Pain in right lower leg: Secondary | ICD-10-CM | POA: Diagnosis not present

## 2020-11-20 DIAGNOSIS — M7121 Synovial cyst of popliteal space [Baker], right knee: Secondary | ICD-10-CM | POA: Diagnosis not present

## 2020-11-20 DIAGNOSIS — M7989 Other specified soft tissue disorders: Secondary | ICD-10-CM | POA: Diagnosis not present

## 2020-11-20 NOTE — Telephone Encounter (Signed)
Called and updated patient of this information. Patient verbalized understanding.  

## 2020-11-20 NOTE — Telephone Encounter (Signed)
[place] Night - Client TELEPHONE ADVICE RECORD [facility] Patient Name: Patrick [name] Gender: Unknown DOB: 25-Dec-1935 Age: 85 Y 2 M 26 D Return Phone Number: Address[place] [address] Client [place] Night - Client Client Site [place] Physician [name] - MD Contact Type Call Who Is Calling Lab Lab Name [facility] Lab Phone Number [phone] Lab Tech Name [place] Lab Reference Number [id number] R Chief Complaint Lab Result (Critical or Stat) Call Type Lab Send to RN Reason for Call Report lab results Initial Comment Caller states she has a critical lab result to report. Translation No Disp. Time [name] Time) Disposition Final User 11/18/2020 8:37:03 AM Clinical Call Yes [name], RN, [facility]

## 2020-11-20 NOTE — Telephone Encounter (Signed)
D-Dimer: 1.61

## 2020-11-20 NOTE — Telephone Encounter (Signed)
I had already done a phone note on Saturday when I got this report Patrick [name], Please let him know that the ultrasound today showed NO DVT---this is reassuring (and history did suggest ruptured Baker's cyst)

## 2020-11-20 NOTE — Telephone Encounter (Signed)
[place] Night - Client TELEPHONE ADVICE RECORD [facility] Patient Name: Patrick [name] Gender: Unknown DOB: 13-Aug-1936 Age: 85 Y 2 M 26 D Return Phone Number: Address: City/State/Zip: [place] [address] Client [place] Night - Client Client Site [place] Physician [name] - MD Contact Type Call Who Is Calling Lab Lab Name [facility] Lab Phone Number [phone] Lab Tech Name Rodney Langton Reference Number [id number] R Chief Complaint Lab Result (Critical or Stat) Call Type Lab Send to RN Reason for Call Report lab results Initial Comment Caller states that she is calling with critical labs to report. Translation No Nurse Assessment Nurse: Ferdinand Lango, RN, [facility] Houseman Date/Time (Eastern Time): 11/18/2020 2:48:01 PM Is there an on-call provider listed? ---Yes Please list name of person reporting value (Lab Employee) and a contact number. ---Sharyn Lull Please document the following items: Lab name Lab value (read back to lab to verify) Reference range for lab value Date and time blood was drawn Collect time of birth for bilirubin results ---PT: Janace Litten Lab: D-Dimer Ref # [id number] R Lab Tech: Not listed Lab Value: 1.61 Normal: <0.50 Lab obtained: 11/17/20 17:17 Most recent: N/A Ordered by: Dr. [name] Please collect the patient contact information from the lab. (name, phone number and address) ---[employer]. [place],  [id number] Disp. Time [name] Time) Disposition Final User 11/18/2020 2:54:02 PM Paged On Call back to [facility] Ferdinand Lango, RN, Kenney Houseman 11/18/2020 3:11:31 PM Paged On Call back to [facility] Ferdinand Lango, RN, Kenney Houseman 11/18/2020 3:17:23 PM Clinical Call Yes Ferdinand Lango, RN, [place] Phone DateTime Result/Outcome Message Type Notes Viviana Simpler- MD [phone] 11/18/2020 2:54:02 PM Paged On Call Back to Call Center Doctor Paged please  call nurse [phone] PLEASE NOTE: All timestamps contained within this report are represented as [place] Standard Time. CONFIDENTIALTY NOTICE: This fax transmission is intended only for the addressee. It contains information that is legally privileged, confidential or otherwise protected from use or disclosure. If you are not the intended recipient, you are strictly prohibited from reviewing, disclosing, copying using or disseminating any of this information or taking any action in reliance on or regarding this information. If you have received this fax in error, please notify us immediately by telephone so that we can arrange for its return to [place]. Phone: [phone], Toll-Free: [phone], Fax: [phone] Page: 2 of 2 Call Id: [id number] Paging DoctorName Phone DateTime Result/Outcome Message Type Notes Viviana Simpler- MD [phone] 11/18/2020 3:11:31 PM Paged On Call Back to Call Center Doctor Paged call nurse [phone] Viviana Simpler- MD 11/18/2020 3:16:05 PM Spoke with On Call - General Message Result Notified of Lab Result

## 2020-11-20 NOTE — Telephone Encounter (Signed)
Good.

## 2020-11-21 ENCOUNTER — Other Ambulatory Visit: Payer: Self-pay | Admitting: Internal Medicine

## 2020-11-21 DIAGNOSIS — F39 Unspecified mood [affective] disorder: Secondary | ICD-10-CM

## 2020-11-22 ENCOUNTER — Other Ambulatory Visit: Payer: Self-pay

## 2020-11-22 ENCOUNTER — Ambulatory Visit (INDEPENDENT_AMBULATORY_CARE_PROVIDER_SITE_OTHER): Payer: PPO | Admitting: Internal Medicine

## 2020-11-22 ENCOUNTER — Encounter: Payer: Self-pay | Admitting: Internal Medicine

## 2020-11-22 DIAGNOSIS — M7989 Other specified soft tissue disorders: Secondary | ICD-10-CM | POA: Insufficient documentation

## 2020-11-22 NOTE — Telephone Encounter (Signed)
Last filled 10-25-20 #60 Last OV 09-20-20 Next OV 03-20-21 [place]

## 2020-11-22 NOTE — Progress Notes (Signed)
Subjective:    Patient ID: Patrick [name], male    DOB: Jan 15, 1936, 85 y.o.   MRN: [id number]  HPI Here for follow up of leg swelling  Leaned over couch and and felt pop in right leg (~1/14) Progressive worsening of swelling --thought he pulled muscle Came in and seen 1/21 Then had ultrasound--negative on 1/24  Swelling has come down some Calf is tight--but less Did have some fluid come out of area over posterior ankle Some pain  Is elevating Wrapping ice packs  Current Outpatient Medications on File Prior to Visit  Medication Sig Dispense Refill  . ALPRAZolam (XANAX) 1 MG tablet TAKE 1 TABLET BY MOUTH TWICE (2) DAILY AS NEEDED 60 tablet 0  . Ascorbic Acid (VITAMIN C) 1000 MG tablet Take 1,000 mg by mouth daily.    Marland Kitchen aspirin 81 MG tablet Take 81 mg by mouth daily.    . betamethasone dipropionate (DIPROLENE) 0.05 % ointment Apply topically 2 (two) times daily as needed (Rash). 45 g 0  . Blood Glucose Monitoring Suppl (ONE TOUCH ULTRA 2) w/Device KIT SMARTSIG:1 Each Via Meter As Directed    . cholecalciferol (VITAMIN D) 1000 units tablet Take 1,000 Units by mouth daily.    Marland Kitchen desonide (DESOWEN) 0.05 % lotion APPLY TO AFFECTED AREA ON FACE TWICE DAILY 118 mL 0  . doxycycline (VIBRA-TABS) 100 MG tablet Take 1 tablet (100 mg total) by mouth 2 (two) times daily. 20 tablet 0  . fish oil-omega-3 fatty acids 1000 MG capsule Take 2 g by mouth daily.    . fluconazole (DIFLUCAN) 150 MG tablet Take 1 tablet (150 mg total) by mouth once a week. 4 tablet 2  . gabapentin (NEURONTIN) 100 MG capsule Take 1-2 capsules (100-200 mg total) by mouth at bedtime. 60 capsule 11  . glimepiride (AMARYL) 4 MG tablet TAKE 1 TABLET BY MOUTH TWICE A DAY FOR DIABETES 180 tablet 3  . glucose blood (ONE TOUCH ULTRA TEST) test strip USE TO TEST BLOOD SUGAR TWICE DAILY. Dx Code E11.49 100 each 3  . hydrocortisone 2.5 % cream Apply topically 2 (two) times daily as needed (Rash). 30 g 2  . ketoconazole (NIZORAL) 2  % cream Apply twice daily as needed 60 g 1  . ketoconazole (NIZORAL) 2 % shampoo Apply to scalp 3 times per week. Leave in for a for minutes before rinsing. 120 mL 0  . latanoprost (XALATAN) 0.005 % ophthalmic solution Place 1 drop into both eyes Daily.    Marland Kitchen lisinopril (ZESTRIL) 40 MG tablet TAKE 1 TABLET BY MOUTH EVERY MORNING FORBLOOD PRESSURE 90 tablet 3  . metFORMIN (GLUCOPHAGE) 1000 MG tablet TAKE 1 TABLETS BY MOUTH BEFORE BREAKFASTAND THEN 1 TABLET BEFORE DINNER AND 1/2 TABLET AT BEDTIME FOR DIABETES. 235 tablet 3  . mupirocin ointment (BACTROBAN) 2 % Apply 1 application topically 3 (three) times daily. Apply to open areas buttocks crease as directed 22 g 2  . ONETOUCH DELICA LANCETS 50D MISC 1 Units by Does not apply route as needed. Use one to obtain blood sugar. Dx  E11.49 100 each 3  . traMADol (ULTRAM) 50 MG tablet Take 1 tablet (50 mg total) by mouth every 6 (six) hours as needed for up to 5 days. 20 tablet 0  . verapamil (CALAN-SR) 240 MG CR tablet TAKE 1 TABLET BY MOUTH EVERY MORNING FORBLOOD PRESSURE 90 tablet 3  . vitamin E 400 UNIT capsule Take 400 Units by mouth daily.     No current facility-administered medications  on file prior to visit.    Allergies  Allergen Reactions  . Gabapentin Other (See Comments)    Dizziness, lightheaded, nausea    Past Medical History:  Diagnosis Date  . Anxiety   . Diabetes mellitus   . GERD (gastroesophageal reflux disease)   . Hyperlipidemia   . Hypertension   . Irregular heartbeat    Dr. (his) stated that it was "nothing to worry about."  . Peptic ulcer disease   . Renal cyst   . Rosacea     Past Surgical History:  Procedure Laterality Date  . CHOLECYSTECTOMY    . KNEE ARTHROSCOPY     left  . PARTIAL GASTRECTOMY  1974    Family History  Problem Relation Age of Onset  . Cancer Brother        prostate  . Colon cancer Brother   . Kidney failure Brother   . Cancer Sister   . Heart disease Brother   . Cancer Brother         prostate cancer  . Cancer Brother        prostate    Social History   Socioeconomic History  . Marital status: Widowed    Spouse name: Not on file  . Number of children: 2  . Years of education: Not on file  . Highest education level: Not on file  Occupational History  . Occupation: RETIRED, Programmer, systems: RETIRED  Tobacco Use  . Smoking status: Never Smoker  . Smokeless tobacco: Never Used  Substance and Sexual Activity  . Alcohol use: No  . Drug use: Not on file  . Sexual activity: Not on file  Other Topics Concern  . Not on file  Social History Narrative   Now has living will   Son is health care POA--no alternate   Would accept resuscitation attempts   No tube feeds if cognitively unaware   Social Determinants of Health   Financial Resource Strain: Low Risk   . Difficulty of Paying Living Expenses: Not hard at all  Food Insecurity: Not on file  Transportation Needs: Not on file  Physical Activity: Not on file  Stress: Not on file  Social Connections: Not on file  Intimate Partner Violence: Not on file   Review of Systems  No chest pain No SOB     Objective:   Physical Exam Constitutional:      Appearance: Normal appearance.  Abdominal:     Palpations: Abdomen is soft.     Tenderness: There is no abdominal tenderness.  Musculoskeletal:     Comments: Reedy swelling of medial right thigh Less prominent calf swelling with mild tenderness Small open area without significant inflammation top of posterior ankle 2+foot swelling  Neurological:     Mental Status: He is alert.            Assessment & Plan:

## 2020-11-22 NOTE — Assessment & Plan Note (Addendum)
No DVT but prominent findings---more so in thigh at this point Will check abdominal ultrasound to be sure no obvious mass, adenopathy, etc to account for this swelling ?consider vascular/cardiology evaluation if persist  Continue elevating Try heat instead of ice

## 2020-11-28 ENCOUNTER — Other Ambulatory Visit: Payer: Self-pay

## 2020-11-28 ENCOUNTER — Ambulatory Visit: Payer: PPO | Admitting: Dermatology

## 2020-11-28 ENCOUNTER — Ambulatory Visit
Admission: RE | Admit: 2020-11-28 | Discharge: 2020-11-28 | Disposition: A | Discharge status: Home / Self Care | Payer: PPO | Source: Ambulatory Visit | Attending: Internal Medicine | Admitting: Internal Medicine

## 2020-11-28 DIAGNOSIS — K7689 Other specified diseases of liver: Secondary | ICD-10-CM | POA: Diagnosis not present

## 2020-11-28 DIAGNOSIS — M7989 Other specified soft tissue disorders: Secondary | ICD-10-CM | POA: Diagnosis not present

## 2020-11-28 DIAGNOSIS — N281 Cyst of kidney, acquired: Secondary | ICD-10-CM | POA: Diagnosis not present

## 2020-11-28 DIAGNOSIS — Z9049 Acquired absence of other specified parts of digestive tract: Secondary | ICD-10-CM | POA: Diagnosis not present

## 2020-11-30 ENCOUNTER — Telehealth: Payer: Self-pay | Admitting: Internal Medicine

## 2020-11-30 NOTE — Telephone Encounter (Signed)
I had seen it--call to him apparently not made yet Ultrasound looks fine--pretty much normal If his leg swelling doesn't get better--the next step is a vascular surgeon

## 2020-11-30 NOTE — Telephone Encounter (Signed)
Left message on VM per pt.

## 2020-11-30 NOTE — Telephone Encounter (Signed)
It might but I am not sure why it is mostly in his right leg and not so much in the left. Don't want to cause him to be dehydrated

## 2020-11-30 NOTE — Telephone Encounter (Signed)
Spoke to pt. He is asking if a fluid pill may help.

## 2020-11-30 NOTE — Telephone Encounter (Signed)
Patient called to check on his results from his 2nd ultrasound. Please advise. EM

## 2020-12-05 ENCOUNTER — Telehealth: Payer: Self-pay | Admitting: Internal Medicine

## 2020-12-05 DIAGNOSIS — M7989 Other specified soft tissue disorders: Secondary | ICD-10-CM

## 2020-12-05 NOTE — Telephone Encounter (Signed)
Pt called in wanted to know about getting a referral for a leg doctor.

## 2020-12-06 NOTE — Telephone Encounter (Signed)
Left message on vm for pt

## 2020-12-06 NOTE — Telephone Encounter (Signed)
Please let him know that I put in the consultation and hopefully he should hear about this in the next few days

## 2020-12-11 ENCOUNTER — Other Ambulatory Visit: Payer: Self-pay | Admitting: *Deleted

## 2020-12-11 DIAGNOSIS — M79661 Pain in right lower leg: Secondary | ICD-10-CM

## 2020-12-11 DIAGNOSIS — M7989 Other specified soft tissue disorders: Secondary | ICD-10-CM

## 2020-12-21 ENCOUNTER — Encounter: Payer: Self-pay | Admitting: Vascular Surgery

## 2020-12-21 ENCOUNTER — Ambulatory Visit: Payer: PPO | Admitting: Vascular Surgery

## 2020-12-21 ENCOUNTER — Other Ambulatory Visit: Payer: Self-pay

## 2020-12-21 ENCOUNTER — Ambulatory Visit (HOSPITAL_COMMUNITY)
Admission: RE | Admit: 2020-12-21 | Discharge: 2020-12-21 | Disposition: A | Discharge status: Home / Self Care | Payer: PPO | Source: Ambulatory Visit | Attending: Vascular Surgery | Admitting: Vascular Surgery

## 2020-12-21 ENCOUNTER — Other Ambulatory Visit: Payer: Self-pay | Admitting: Internal Medicine

## 2020-12-21 VITALS — BP 167/85 | HR 83 | Temp 97.9°F | Resp 18 | Ht 67.5 in | Wt 191.0 lb

## 2020-12-21 DIAGNOSIS — M79661 Pain in right lower leg: Secondary | ICD-10-CM | POA: Diagnosis not present

## 2020-12-21 DIAGNOSIS — M7989 Other specified soft tissue disorders: Secondary | ICD-10-CM | POA: Diagnosis not present

## 2020-12-21 DIAGNOSIS — F39 Unspecified mood [affective] disorder: Secondary | ICD-10-CM

## 2020-12-21 NOTE — Progress Notes (Signed)
REASON FOR CONSULT:    Right lower extremity swelling.  The consult is requested by Dr. Viviana Simpler.  ASSESSMENT & PLAN:   RIGHT LOWER EXTREMITY SWELLING: This patient developed right lower extremity swelling after possibly pulling a muscle in his right leg in early January.  He had a negative venous duplex scan on 11/20/2020 and his venous study today shows no evidence of DVT and no evidence of venous reflux.  I think most likely he had a ruptured Baker's cyst which appears to have resolved based on today's duplex.  He may have also pulled a muscle as he did have some swelling.  Regardless I reassured him that he has no evidence of significant venous disease.  We have discussed the importance of intermittent leg elevation the proper positioning for this.  We discussed use of compression stockings although I do not think he should be measured for until he elevates his legs for the next several weeks to get the swelling down in both legs.  After that I would recommend a knee-high compression stocking with a gradient of 15 to 20 mmHg.  I encouraged him to avoid prolonged sitting and standing.  We discussed the importance of exercise specifically walking and water aerobics.  We also discussed the importance of maintaining a healthy weight as central obesity especially increases lower extremity venous pressure.  I will be happy to see him back in any time if any new vascular issues arise.    Deitra Mayo, MD Office: [phone]   HPI:   Patrick [name] is a pleasant 85 y.o. male, who in early January was sitting on the couch with his leg extended and tried to grab something and felt something tear behind his right knee.  He developed significant swelling in the right leg and on 11/20/2020 underwent a venous duplex scan which showed no evidence of DVT.  He was noted to have a small Baker's cyst at that time.  The swelling is gradually improved although he still has persistent swelling.  He also  noted some ecchymosis in the lower leg after this event which would suggest possibly a muscle tear.  However certainly could have ruptured a Baker's cyst when this all occurred.  He denies any previous history of DVT or phlebitis.  I do not get any history of claudication, rest pain, or nonhealing ulcers.  He does have compression stockings but has not been wearing them much.  He does try to elevate his legs in his recliner but it sounds like the position he is in is not especially effective.  He said no previous venous procedures.  Past Medical History:  Diagnosis Date  . Anxiety   . Diabetes mellitus   . GERD (gastroesophageal reflux disease)   . Hyperlipidemia   . Hypertension   . Irregular heartbeat    Dr. (his) stated that it was "nothing to worry about."  . Peptic ulcer disease   . Renal cyst   . Rosacea     Family History  Problem Relation Age of Onset  . Cancer Brother        prostate  . Colon cancer Brother   . Kidney failure Brother   . Cancer Sister   . Heart disease Brother   . Cancer Brother        prostate cancer  . Cancer Brother        prostate    SOCIAL HISTORY: Social History   Socioeconomic History  . Marital status: Widowed  Spouse name: Not on file  . Number of children: 2  . Years of education: Not on file  . Highest education level: Not on file  Occupational History  . Occupation: RETIRED, Architect: RETIRED  Tobacco Use  . Smoking status: Never Smoker  . Smokeless tobacco: Never Used  Substance and Sexual Activity  . Alcohol use: No  . Drug use: Not on file  . Sexual activity: Not on file  Other Topics Concern  . Not on file  Social History Narrative   Now has living will   Son is health care POA--no alternate   Would accept resuscitation attempts   No tube feeds if cognitively unaware   Social Determinants of Health   Financial Resource Strain: Low Risk   . Difficulty of Paying Living Expenses: Not hard at all   Food Insecurity: Not on file  Transportation Needs: Not on file  Physical Activity: Not on file  Stress: Not on file  Social Connections: Not on file  Intimate Partner Violence: Not on file    Allergies  Allergen Reactions  . Gabapentin Other (See Comments)    Dizziness, lightheaded, nausea    Current Outpatient Medications  Medication Sig Dispense Refill  . Ascorbic Acid (VITAMIN C) 1000 MG tablet Take 1,000 mg by mouth daily.    Marland Kitchen aspirin 81 MG tablet Take 81 mg by mouth daily.    . betamethasone dipropionate (DIPROLENE) 0.05 % ointment Apply topically 2 (two) times daily as needed (Rash). 45 g 0  . Blood Glucose Monitoring Suppl (ONE TOUCH ULTRA 2) w/Device KIT SMARTSIG:1 Each Via Meter As Directed    . cholecalciferol (VITAMIN D) 1000 units tablet Take 1,000 Units by mouth daily.    Marland Kitchen desonide (DESOWEN) 0.05 % lotion APPLY TO AFFECTED AREA ON FACE TWICE DAILY 118 mL 0  . fish oil-omega-3 fatty acids 1000 MG capsule Take 2 g by mouth daily.    . fluconazole (DIFLUCAN) 150 MG tablet Take 1 tablet (150 mg total) by mouth once a week. 4 tablet 2  . gabapentin (NEURONTIN) 100 MG capsule Take 1-2 capsules (100-200 mg total) by mouth at bedtime. 60 capsule 11  . glimepiride (AMARYL) 4 MG tablet TAKE 1 TABLET BY MOUTH TWICE A DAY FOR DIABETES 180 tablet 3  . glucose blood (ONE TOUCH ULTRA TEST) test strip USE TO TEST BLOOD SUGAR TWICE DAILY. Dx Code E11.49 100 each 3  . hydrocortisone 2.5 % cream Apply topically 2 (two) times daily as needed (Rash). 30 g 2  . ketoconazole (NIZORAL) 2 % cream Apply twice daily as needed 60 g 1  . ketoconazole (NIZORAL) 2 % shampoo Apply to scalp 3 times per week. Leave in for a for minutes before rinsing. 120 mL 0  . latanoprost (XALATAN) 0.005 % ophthalmic solution Place 1 drop into both eyes Daily.    Marland Kitchen lisinopril (ZESTRIL) 40 MG tablet TAKE 1 TABLET BY MOUTH EVERY MORNING FORBLOOD PRESSURE 90 tablet 3  . metFORMIN (GLUCOPHAGE) 1000 MG tablet TAKE 1  TABLETS BY MOUTH BEFORE BREAKFASTAND THEN 1 TABLET BEFORE DINNER AND 1/2 TABLET AT BEDTIME FOR DIABETES. 235 tablet 3  . mupirocin ointment (BACTROBAN) 2 % Apply 1 application topically 3 (three) times daily. Apply to open areas buttocks crease as directed 22 g 2  . ONETOUCH DELICA LANCETS 33G MISC 1 Units by Does not apply route as needed. Use one to obtain blood sugar. Dx  E11.49 100 each 3  . verapamil (CALAN-SR)  240 MG CR tablet TAKE 1 TABLET BY MOUTH EVERY MORNING FORBLOOD PRESSURE 90 tablet 3  . vitamin E 400 UNIT capsule Take 400 Units by mouth daily.    Marland Kitchen ALPRAZolam (XANAX) 1 MG tablet TAKE 1 TABLET BY MOUTH TWICE (2) DAILY AS NEEDED 60 tablet 0  . doxycycline (VIBRA-TABS) 100 MG tablet Take 1 tablet (100 mg total) by mouth 2 (two) times daily. (Patient not taking: Reported on 12/21/2020) 20 tablet 0   No current facility-administered medications for this visit.    REVIEW OF SYSTEMS:  _0  denotes positive finding, _1  denotes negative finding Cardiac  Comments:  Chest pain or chest pressure:    Shortness of breath upon exertion:    Short of breath when lying flat:    Irregular heart rhythm:        Vascular    Pain in calf, thigh, or hip brought on by ambulation:    Pain in feet at night that wakes you up from your sleep:     Blood clot in your veins:    Leg swelling:  x       Pulmonary    Oxygen at home:    Productive cough:     Wheezing:         Neurologic    Sudden weakness in arms or legs:     Sudden numbness in arms or legs:     Sudden onset of difficulty speaking or slurred speech:    Temporary loss of vision in one eye:     Problems with dizziness:         Gastrointestinal    Blood in stool:     Vomited blood:         Genitourinary    Burning when urinating:     Blood in urine:        Psychiatric    Major depression:         Hematologic    Bleeding problems:    Problems with blood clotting too easily:        Skin    Rashes or ulcers:         Constitutional    Fever or chills:     PHYSICAL EXAM:   Vitals:   12/21/20 1343  BP: (!) 167/85  Pulse: 83  Resp: 18  Temp: 97.9 F (36.6 C)  TempSrc: Temporal  SpO2: 100%  Weight: 86.6 kg  Height: 5' 7.5" (1.715 m)    GENERAL: The patient is a well-nourished male, in no acute distress. The vital signs are documented above. CARDIAC: There is a regular rate and rhythm.  VASCULAR: I do not detect carotid bruits. Despite the swelling I was able to palpate dorsalis pedis and posterior tibial pulses bilaterally. He has bilateral lower extremity swelling with pitting edema. PULMONARY: There is good air exchange bilaterally without wheezing or rales. ABDOMEN: Soft and non-tender with normal pitched bowel sounds.  MUSCULOSKELETAL: There are no major deformities or cyanosis. NEUROLOGIC: No focal weakness or paresthesias are detected. SKIN: There are no ulcers or rashes noted. PSYCHIATRIC: The patient has a normal affect.  DATA:    VENOUS DUPLEX: I reviewed the venous duplex scan that was done on 11/20/2020.  This was of the right lower extremity.  This showed no evidence of DVT in the right lower extremity.  There was a small Baker's cyst in the right popliteal fossa.  VENOUS REFLUX STUDY: I have independently interpreted his venous reflux study today of the right lower  extremity.  There is no evidence of DVT.  There is no deep venous reflux.  There is no superficial venous reflux.

## 2020-12-21 NOTE — Telephone Encounter (Signed)
Last filled 11-22-20 #60 Last OV 11-22-20 Acute Next OV 03-20-21 [place]

## 2020-12-25 ENCOUNTER — Other Ambulatory Visit: Payer: Self-pay | Admitting: Internal Medicine

## 2021-01-01 NOTE — Assessment & Plan Note (Signed)
Possible ruptured baker's cyst, but cellulitis also on differential. Elevate right leg above heart as able. Can ice intermittently behind knee. Eval with labs. Go to [facility] for ultrasound of lower leg: tommorow Can use tramadol for pain as needed. Use ibuprofen for pain until you can pick this up.  Start antibiotics to cover for bacterial infection.  Go to ER if severe pain, shortness of breath or chest pain or redness spreading up leg.

## 2021-01-19 ENCOUNTER — Other Ambulatory Visit: Payer: Self-pay | Admitting: Internal Medicine

## 2021-01-19 DIAGNOSIS — F39 Unspecified mood [affective] disorder: Secondary | ICD-10-CM

## 2021-01-19 NOTE — Telephone Encounter (Signed)
Last filled 12-21-20 #60 Last OV 11-22-20 Acute Next OV 03-20-21 [place]

## 2021-02-21 ENCOUNTER — Other Ambulatory Visit: Payer: Self-pay | Admitting: Internal Medicine

## 2021-02-21 DIAGNOSIS — F39 Unspecified mood [affective] disorder: Secondary | ICD-10-CM

## 2021-02-21 NOTE — Telephone Encounter (Signed)
Last filled3-26-22#60 Last OV 11-22-20 Acute Next OV 03-20-21 [place]

## 2021-03-17 ENCOUNTER — Other Ambulatory Visit: Payer: Self-pay | Admitting: Internal Medicine

## 2021-03-17 DIAGNOSIS — F39 Unspecified mood [affective] disorder: Secondary | ICD-10-CM

## 2021-03-19 ENCOUNTER — Telehealth: Payer: Self-pay

## 2021-03-19 NOTE — Chronic Care Management (AMB) (Addendum)
Chronic Care Management Pharmacy Assistant   Name: Patrick [name]  MRN: [id number] DOB: [birth year]  Reason for Encounter: Disease State  DM   Conditions to be addressed/monitored: DMII  Recent office visits:  03/20/2021 Patrick [name] PCP - Added silver sulfadiazine 1% cream for 1 application daily  follow up 6 months 11/22/2020 - Patrick [name] PCP - No medication changes  11/17/2020 Patrick [name], Family Medicine- Started doxycycline and tramadol  Recent consult visits:  12/21/2020 - Patrick [name] Vascular Surgery - No medication changes  10/11/2020 - Patrick [name] Dermatology - Continue to apply betamethasone 2 times a day for 2 weeks, then use on weekends.  Hospital visits:  None in previous 6 months  Medications: Outpatient Encounter Medications as of 03/19/2021  Medication Sig   ALPRAZolam (XANAX) 1 MG tablet TAKE 1 TABLET BY MOUTH TWICE (2) DAILY AS NEEDED   Ascorbic Acid (VITAMIN C) 1000 MG tablet Take 1,000 mg by mouth daily.   aspirin 81 MG tablet Take 81 mg by mouth daily.   betamethasone dipropionate (DIPROLENE) 0.05 % ointment Apply topically 2 (two) times daily as needed (Rash).   Blood Glucose Monitoring Suppl (ONE TOUCH ULTRA 2) w/Device KIT SMARTSIG:1 Each Via Meter As Directed   cholecalciferol (VITAMIN D) 1000 units tablet Take 1,000 Units by mouth daily.   desonide (DESOWEN) 0.05 % lotion APPLY TO AFFECTED AREA ON FACE TWICE DAILY   doxycycline (VIBRA-TABS) 100 MG tablet Take 1 tablet (100 mg total) by mouth 2 (two) times daily. (Patient not taking: Reported on 12/21/2020)   fish oil-omega-3 fatty acids 1000 MG capsule Take 2 g by mouth daily.   fluconazole (DIFLUCAN) 150 MG tablet Take 1 tablet (150 mg total) by mouth once a week.   gabapentin (NEURONTIN) 100 MG capsule Take 1-2 capsules (100-200 mg total) by mouth at bedtime.   glimepiride (AMARYL) 4 MG tablet TAKE 1 TABLET BY MOUTH TWICE A DAY FOR DIABETES   glucose blood (ONE TOUCH ULTRA TEST) test  strip USE TO TEST BLOOD SUGAR TWICE DAILY. Dx Code E11.49   hydrocortisone 2.5 % cream Apply topically 2 (two) times daily as needed (Rash).   ketoconazole (NIZORAL) 2 % cream Apply twice daily as needed   ketoconazole (NIZORAL) 2 % shampoo Apply to scalp 3 times per week. Leave in for a for minutes before rinsing.   latanoprost (XALATAN) 0.005 % ophthalmic solution Place 1 drop into both eyes Daily.   lisinopril (ZESTRIL) 40 MG tablet TAKE 1 TABLET BY MOUTH EVERY MORNING FORBLOOD PRESSURE   metFORMIN (GLUCOPHAGE) 1000 MG tablet TAKE 1 TABLETS BY MOUTH BEFORE BREAKFASTAND THEN 1 TABLET BEFORE DINNER AND 1/2 TABLET AT BEDTIME FOR DIABETES.   mupirocin ointment (BACTROBAN) 2 % Apply 1 application topically 3 (three) times daily. Apply to open areas buttocks crease as directed   ONETOUCH DELICA LANCETS 24P MISC 1 Units by Does not apply route as needed. Use one to obtain blood sugar. Dx  E11.49   verapamil (CALAN-SR) 240 MG CR tablet TAKE 1 TABLET BY MOUTH EVERY MORNING FORBLOOD PRESSURE   vitamin E 400 UNIT capsule Take 400 Units by mouth daily.   No facility-administered encounter medications on file as of 03/19/2021.    Recent Relevant Labs: Lab Results  Component Value Date/Time   HGBA1C 7.1 (A) 09/19/2020 08:20 AM   HGBA1C 8.1 (H) 03/07/2020 08:01 AM   HGBA1C 7.7 (A) 09/07/2019 11:40 AM   HGBA1C 8.7 (H) 03/16/2019 08:45 AM   MICROALBUR 1.4 05/15/2011 09:01 AM  MICROALBUR 1.1 12/20/2008 12:00 AM    Kidney Function Lab Results  Component Value Date/Time   CREATININE 1.27 (H) 11/17/2020 05:17 PM   CREATININE 1.10 03/07/2020 08:01 AM   CREATININE 0.95 03/16/2019 08:45 AM   GFR 64.16 03/07/2020 08:01 AM   GFRNONAA 78.99 07/30/2010 10:42 AM   GFRAA 107 12/20/2008 12:00 AM    Current antihyperglycemic regimen:  Metformin 1000 mg - 1 tablet at breakfast, 1 tablet at noon, 1/2 tablet at 5 PM Glimepiride 4 mg - 1 tablet twice daily  Patient verbally confirms he is taking the above  medications as directed. Yes spoke with patient and he agrees with medications and doses.  What recent interventions/DTPs have been made to improve glycemic control: None identified  Have there been any recent hospitalizations or ED visits since last visit with CPP? No  Patient denies hypoglycemic symptoms, including Pale, Sweaty, Shaky, Hungry, Nervous/irritable and Vision changes  Patient denies hyperglycemic symptoms, including blurry vision, excessive thirst, fatigue, polyuria and weakness  How often are you checking your blood sugar? once daily   What are your blood sugars ranging?       Fasting:  03/12/21  97  03/13/21 100  03/14/21 138  03/15/21 117 03/16/21 120 03/17/21 115 03/18/21 143 03/19/21 141    During the week, how often does your blood glucose drop below 70? Never  Are you checking your feet daily/regularly? Yes   The patient was reminded of his appointment with Patrick [name] tomorrow 03/20/2021  Adherence Review: Is the patient currently on a STATIN medication? No Is the patient currently on ACE/ARB medication? Yes Does the patient have >5 day gap between last estimated fill dates? No  Star Rating Drugs:  Medication:  Last Fill: Day Supply Glimepiride 52m 12/21/20 90 Lisinopril 445m2/28/22 90 Verapamil 24099m/25/22 30 Metformin 1000m83m26/22 90  Follow-Up:  Pharmacist Review  MichDebbora DusP notified  VelmAvel SensorAJackson County Memorial Hospitalnical Pharmacy Assistant [phone] reviewed the care management and care coordination activities outlined in this encounter and I am certifying that I agree with the content of this note. No further action required.  MichDebbora DusarmD Clinical Pharmacist [facility]-[phone]

## 2021-03-20 ENCOUNTER — Encounter: Payer: Self-pay | Admitting: Internal Medicine

## 2021-03-20 ENCOUNTER — Other Ambulatory Visit: Payer: Self-pay

## 2021-03-20 ENCOUNTER — Ambulatory Visit (INDEPENDENT_AMBULATORY_CARE_PROVIDER_SITE_OTHER): Payer: PPO | Admitting: Internal Medicine

## 2021-03-20 DIAGNOSIS — I10 Essential (primary) hypertension: Secondary | ICD-10-CM

## 2021-03-20 DIAGNOSIS — L98419 Non-pressure chronic ulcer of buttock with unspecified severity: Secondary | ICD-10-CM | POA: Diagnosis not present

## 2021-03-20 DIAGNOSIS — E1142 Type 2 diabetes mellitus with diabetic polyneuropathy: Secondary | ICD-10-CM | POA: Diagnosis not present

## 2021-03-20 LAB — POCT GLYCOSYLATED HEMOGLOBIN (HGB A1C): Hemoglobin A1C: 7.6 % — AB (ref 4.0–5.6)

## 2021-03-20 MED ORDER — SILVER SULFADIAZINE 1 % EX CREA: 1.0000 "application " | TOPICAL_CREAM | Freq: Every day | CUTANEOUS | 2 refills | Status: DC

## 2021-03-20 NOTE — Telephone Encounter (Signed)
LOV 03/20/21 Routine follow up Next appointment on 09/25/21 6 months follow up  Last filled: Alprazolam 02/21/21 # 60 with 0 refill Gapabentin 03/07/20 # 60 with 11 refills  No contract on file, no UDS on file.

## 2021-03-20 NOTE — Assessment & Plan Note (Signed)
BP Readings from Last 3 Encounters:  03/20/21 138/76  12/21/20 (!) 167/85  11/22/20 140/88   Good control on the lisinopril

## 2021-03-20 NOTE — Progress Notes (Signed)
Subjective:    Patient ID: Patrick [name], male    DOB: 1936-09-03, 85 y.o.   MRN: [id number]  HPI Here for follow up of diabetes and other chronic health conditions This visit occurred during the SARS-CoV-2 public health emergency.  Safety protocols were in place, including screening questions prior to the visit, additional usage of staff PPE, and extensive cleaning of exam room while observing appropriate contact time as indicated for disinfecting solutions.   Right calf swelling is better Diagnosed with ruptured Baker's cyst Not hurting Still gets some foot swelling---if up for a prolonged time (better with elevation) No chest pain or SOB No palpitations Will get brief dizziness if he jumps up quick---better with brief rest  Checks sugars daily 91-154 Mostly under 130 No major hypoglycemic reactions---but he will notice if he is working outside for a long time (may get slight shaking) Ongoing tingling in feet---no major pain (gabapentin-- either 200 bedtime or 100 bid)  Current Outpatient Medications on File Prior to Visit  Medication Sig Dispense Refill  . ALPRAZolam (XANAX) 1 MG tablet TAKE 1 TABLET BY MOUTH TWICE (2) DAILY AS NEEDED 60 tablet 0  . Ascorbic Acid (VITAMIN C) 1000 MG tablet Take 1,000 mg by mouth daily.    Marland Kitchen aspirin 81 MG tablet Take 81 mg by mouth daily.    . betamethasone dipropionate (DIPROLENE) 0.05 % ointment Apply topically 2 (two) times daily as needed (Rash). 45 g 0  . Blood Glucose Monitoring Suppl (ONE TOUCH ULTRA 2) w/Device KIT SMARTSIG:1 Each Via Meter As Directed    . cholecalciferol (VITAMIN D) 1000 units tablet Take 1,000 Units by mouth daily.    Marland Kitchen desonide (DESOWEN) 0.05 % lotion APPLY TO AFFECTED AREA ON FACE TWICE DAILY 118 mL 0  . fish oil-omega-3 fatty acids 1000 MG capsule Take 2 g by mouth daily.    . fluconazole (DIFLUCAN) 150 MG tablet Take 1 tablet (150 mg total) by mouth once a week. 4 tablet 2  . gabapentin (NEURONTIN) 100 MG  capsule Take 1-2 capsules (100-200 mg total) by mouth at bedtime. 60 capsule 11  . glimepiride (AMARYL) 4 MG tablet TAKE 1 TABLET BY MOUTH TWICE A DAY FOR DIABETES 180 tablet 3  . glucose blood (ONE TOUCH ULTRA TEST) test strip USE TO TEST BLOOD SUGAR TWICE DAILY. Dx Code E11.49 100 each 3  . hydrocortisone 2.5 % cream Apply topically 2 (two) times daily as needed (Rash). 30 g 2  . ketoconazole (NIZORAL) 2 % cream Apply twice daily as needed 60 g 1  . ketoconazole (NIZORAL) 2 % shampoo Apply to scalp 3 times per week. Leave in for a for minutes before rinsing. 120 mL 0  . latanoprost (XALATAN) 0.005 % ophthalmic solution Place 1 drop into both eyes Daily.    Marland Kitchen lisinopril (ZESTRIL) 40 MG tablet TAKE 1 TABLET BY MOUTH EVERY MORNING FORBLOOD PRESSURE 90 tablet 3  . metFORMIN (GLUCOPHAGE) 1000 MG tablet TAKE 1 TABLETS BY MOUTH BEFORE BREAKFASTAND THEN 1 TABLET BEFORE DINNER AND 1/2 TABLET AT BEDTIME FOR DIABETES. 235 tablet 3  . mupirocin ointment (BACTROBAN) 2 % Apply 1 application topically 3 (three) times daily. Apply to open areas buttocks crease as directed 22 g 2  . ONETOUCH DELICA LANCETS 90W MISC 1 Units by Does not apply route as needed. Use one to obtain blood sugar. Dx  E11.49 100 each 3  . verapamil (CALAN-SR) 240 MG CR tablet TAKE 1 TABLET BY MOUTH EVERY MORNING FORBLOOD PRESSURE  90 tablet 3  . vitamin E 400 UNIT capsule Take 400 Units by mouth daily.     No current facility-administered medications on file prior to visit.    No Active Allergies  Past Medical History:  Diagnosis Date  . Anxiety   . Diabetes mellitus   . GERD (gastroesophageal reflux disease)   . Hyperlipidemia   . Hypertension   . Irregular heartbeat    Dr. (his) stated that it was "nothing to worry about."  . Peptic ulcer disease   . Renal cyst   . Rosacea     Past Surgical History:  Procedure Laterality Date  . CHOLECYSTECTOMY    . KNEE ARTHROSCOPY     left  . PARTIAL GASTRECTOMY  1974    Family  History  Problem Relation Age of Onset  . Cancer Brother        prostate  . Colon cancer Brother   . Kidney failure Brother   . Cancer Sister   . Heart disease Brother   . Cancer Brother        prostate cancer  . Cancer Brother        prostate    Social History   Socioeconomic History  . Marital status: Widowed    Spouse name: Not on file  . Number of children: 2  . Years of education: Not on file  . Highest education level: Not on file  Occupational History  . Occupation: RETIRED, Programmer, systems: RETIRED  Tobacco Use  . Smoking status: Never Smoker  . Smokeless tobacco: Never Used  Substance and Sexual Activity  . Alcohol use: No  . Drug use: Not on file  . Sexual activity: Not on file  Other Topics Concern  . Not on file  Social History Narrative   Now has living will   Son is health care POA--no alternate   Would accept resuscitation attempts   No tube feeds if cognitively unaware   Social Determinants of Health   Financial Resource Strain: Low Risk   . Difficulty of Paying Living Expenses: Not hard at all  Food Insecurity: Not on file  Transportation Needs: Not on file  Physical Activity: Not on file  Stress: Not on file  Social Connections: Not on file  Intimate Partner Violence: Not on file   Review of Systems Appetite is good Weight is stable Did have problem with sleep cycle for a while---got it back on track now though    Objective:   Physical Exam Constitutional:      Appearance: Normal appearance.  Cardiovascular:     Rate and Rhythm: Normal rate and regular rhythm.     Pulses: Normal pulses.     Heart sounds: No murmur heard. No gallop.   Pulmonary:     Effort: Pulmonary effort is normal.     Breath sounds: Normal breath sounds. No wheezing or rales.  Musculoskeletal:     Cervical back: Neck supple.     Comments: Mild edema in feet   Lymphadenopathy:     Cervical: No cervical adenopathy.  Skin:    Comments: Scattered  erythematous plaque like rash--back/chest Redness on both convex buttocks with 2cm oval ulcer on right  Neurological:     Mental Status: He is alert.            Assessment & Plan:

## 2021-03-20 NOTE — Assessment & Plan Note (Signed)
Superficial but recurrent Will give silvadene for protection/infection prevention

## 2021-03-20 NOTE — Assessment & Plan Note (Addendum)
Seems to have adequate control on glimepiride and metformin Mild neuropathy---low dose gabapentin Will recheck A1c  Lab Results  Component Value Date   HGBA1C 7.6 (A) 03/20/2021   Still acceptable though slipped some No medication changes

## 2021-03-22 ENCOUNTER — Telehealth: Payer: Self-pay

## 2021-03-22 MED ORDER — GLUCOSE BLOOD VI STRP: ORAL_STRIP | 3 refills | Status: DC

## 2021-03-22 NOTE — Telephone Encounter (Signed)
Rx sent electronically.  

## 2021-04-20 ENCOUNTER — Other Ambulatory Visit: Payer: Self-pay | Admitting: Internal Medicine

## 2021-04-20 DIAGNOSIS — F39 Unspecified mood [affective] disorder: Secondary | ICD-10-CM

## 2021-04-20 NOTE — Telephone Encounter (Signed)
Last filled 03-21-21 #60 Last OV 03-20-21 Next OV 09-25-21 [place]

## 2021-05-18 ENCOUNTER — Other Ambulatory Visit: Payer: Self-pay | Admitting: Internal Medicine

## 2021-05-18 DIAGNOSIS — F39 Unspecified mood [affective] disorder: Secondary | ICD-10-CM

## 2021-05-18 NOTE — Telephone Encounter (Signed)
Last filled 04-20-21 #60 Last OV 03-20-21 Next OV 09-25-21 [place]

## 2021-06-15 ENCOUNTER — Other Ambulatory Visit: Payer: Self-pay | Admitting: Internal Medicine

## 2021-06-15 DIAGNOSIS — F39 Unspecified mood [affective] disorder: Secondary | ICD-10-CM

## 2021-06-15 NOTE — Telephone Encounter (Signed)
Last filled 05-18-21 #60 Last OV 03-20-21 Next OV 09-25-21 [place]

## 2021-06-20 ENCOUNTER — Telehealth: Payer: Self-pay

## 2021-06-20 NOTE — Chronic Care Management (AMB) (Addendum)
Chronic Care Management Pharmacy Assistant   Name: Patrick [name]  MRN: [id number] DOB: 07/01/1936  Reason for Encounter: General Adherence  Recent office visits:  03/20/21 - PCP - Patient presented for follow up diabetes and chronic ulcer on buttock. Will give silvadene for protection/infection prevention. New A1c (7.6%). Follow up 6 months.  Recent consult visits:  None since last CCM contact  Hospital visits:  None in previous 6 months  Medications: Outpatient Encounter Medications as of 06/20/2021  Medication Sig   ALPRAZolam (XANAX) 1 MG tablet TAKE 1 TABLET BY MOUTH TWICE (2) DAILY AS NEEDED   Ascorbic Acid (VITAMIN C) 1000 MG tablet Take 1,000 mg by mouth daily.   aspirin 81 MG tablet Take 81 mg by mouth daily.   betamethasone dipropionate (DIPROLENE) 0.05 % ointment Apply topically 2 (two) times daily as needed (Rash).   Blood Glucose Monitoring Suppl (ONE TOUCH ULTRA 2) w/Device KIT SMARTSIG:1 Each Via Meter As Directed   cholecalciferol (VITAMIN D) 1000 units tablet Take 1,000 Units by mouth daily.   desonide (DESOWEN) 0.05 % lotion APPLY TO AFFECTED AREA ON FACE TWICE DAILY   fish oil-omega-3 fatty acids 1000 MG capsule Take 2 g by mouth daily.   fluconazole (DIFLUCAN) 150 MG tablet Take 1 tablet (150 mg total) by mouth once a week.   gabapentin (NEURONTIN) 100 MG capsule TAKE 1 OR 2 CAPSULES BY MOUTH AT BEDTIME   glimepiride (AMARYL) 4 MG tablet TAKE 1 TABLET BY MOUTH TWICE A DAY FOR DIABETES   glucose blood (ONE TOUCH ULTRA TEST) test strip USE TO TEST BLOOD SUGAR TWICE DAILY. Dx Code E11.49   hydrocortisone 2.5 % cream Apply topically 2 (two) times daily as needed (Rash).   ketoconazole (NIZORAL) 2 % cream Apply twice daily as needed   ketoconazole (NIZORAL) 2 % shampoo Apply to scalp 3 times per week. Leave in for a for minutes before rinsing.   latanoprost (XALATAN) 0.005 % ophthalmic solution Place 1 drop into both eyes Daily.   lisinopril (ZESTRIL) 40 MG  tablet TAKE 1 TABLET BY MOUTH EVERY MORNING FORBLOOD PRESSURE   metFORMIN (GLUCOPHAGE) 1000 MG tablet TAKE 1 TABLETS BY MOUTH BEFORE BREAKFASTAND THEN 1 TABLET BEFORE DINNER AND 1/2 TABLET AT BEDTIME FOR DIABETES.   mupirocin ointment (BACTROBAN) 2 % Apply 1 application topically 3 (three) times daily. Apply to open areas buttocks crease as directed   ONETOUCH DELICA LANCETS 46K MISC 1 Units by Does not apply route as needed. Use one to obtain blood sugar. Dx  E11.49   silver sulfADIAZINE (SILVADENE) 1 % cream Apply 1 application topically daily.   verapamil (CALAN-SR) 240 MG CR tablet TAKE 1 TABLET BY MOUTH EVERY MORNING FORBLOOD PRESSURE   vitamin E 400 UNIT capsule Take 400 Units by mouth daily.   No facility-administered encounter medications on file as of 06/20/2021.    Contacted Patrick [name] on 06/22/21 for general disease state and medication adherence call. Follow up appointment made with Patrick [name] CPP for 10/08/21 _0 :00am  Patient is not > 5 days past due for refill on the following medications per chart history:  [facility] Medications: Medication Name/mg Last Fill Days Supply Glimepiride 42m          06/15/21             90 Lisinopril 454m           06/15/21             90 Metformin 100047m  06/15/21             90   What concerns do you have about your medications? The patient has no concerns at this time   The patient denies side effects with his medications.   How often do you forget or accidentally miss a dose? Never  Do you use a pillbox? No He has organizer for 1 day due to different times he has to take medications  Are you having any problems getting your medications from your pharmacy? No The patient is happy with [name]   Has the cost of your medications been a concern? No  Since last visit with CPP, no interventions have been made.  The patient has not had an ED visit since last contact.   The patient denies problems with their health.    he denies  concerns or questions for Patrick [name], PharmD at this time.   Counseled patient on:  [place] job taking medications, Importance of taking medication daily without missed doses, Benefits of adherence packaging or a pillbox, and Access to CCM team for any cost, medication or pharmacy concerns.   Care Gaps: Last annual wellness visit:09/22/20 If Diabetic: Last eye exam / retinopathy screening: 07/06/21 Last diabetic foot exam:08/17/20  Upcoming appts: PCP appointment on 09/25/21 and CCM appointment on 10/08/21  Patrick [name], CPP notified  Patrick [name], [place] Assistant [phone]  I have reviewed the care management and care coordination activities outlined in this encounter and I am certifying that I agree with the content of this note. No further action required.  Patrick [name], PharmD Clinical Pharmacist [facility] at [facility] [phone]

## 2021-06-27 ENCOUNTER — Encounter: Payer: Self-pay | Admitting: Gastroenterology

## 2021-07-09 ENCOUNTER — Other Ambulatory Visit: Payer: Self-pay | Admitting: Internal Medicine

## 2021-07-09 DIAGNOSIS — F39 Unspecified mood [affective] disorder: Secondary | ICD-10-CM

## 2021-07-10 NOTE — Telephone Encounter (Signed)
Last filled 06-15-21 #60 Last OV 03-20-21 Next OV 09-25-21 [place]  He usually asks for it early to make sure it is in stock.

## 2021-07-18 DIAGNOSIS — H401131 Primary open-angle glaucoma, bilateral, mild stage: Secondary | ICD-10-CM | POA: Diagnosis not present

## 2021-08-13 ENCOUNTER — Telehealth: Payer: Self-pay

## 2021-08-13 NOTE — Chronic Care Management (AMB) (Addendum)
Chronic Care Management Pharmacy Assistant   Name: Patrick [name]  MRN: [id number] DOB: 1936-05-23   Reason for Encounter:Diabetes Disease State  Recent office visits:  None since las CCM contact  Recent consult visits:  None since last CCM contact  Hospital visits:  None in previous 6 months  Medications: Outpatient Encounter Medications as of 08/13/2021  Medication Sig   ALPRAZolam (XANAX) 1 MG tablet TAKE 1 TABLET BY MOUTH TWICE (2) DAILY AS NEEDED   Ascorbic Acid (VITAMIN C) 1000 MG tablet Take 1,000 mg by mouth daily.   aspirin 81 MG tablet Take 81 mg by mouth daily.   betamethasone dipropionate (DIPROLENE) 0.05 % ointment Apply topically 2 (two) times daily as needed (Rash).   Blood Glucose Monitoring Suppl (ONE TOUCH ULTRA 2) w/Device KIT SMARTSIG:1 Each Via Meter As Directed   cholecalciferol (VITAMIN D) 1000 units tablet Take 1,000 Units by mouth daily.   desonide (DESOWEN) 0.05 % lotion APPLY TO AFFECTED AREA ON FACE TWICE DAILY   fish oil-omega-3 fatty acids 1000 MG capsule Take 2 g by mouth daily.   fluconazole (DIFLUCAN) 150 MG tablet Take 1 tablet (150 mg total) by mouth once a week.   gabapentin (NEURONTIN) 100 MG capsule TAKE 1 OR 2 CAPSULES BY MOUTH AT BEDTIME   glimepiride (AMARYL) 4 MG tablet TAKE 1 TABLET BY MOUTH TWICE A DAY FOR DIABETES   glucose blood (ONE TOUCH ULTRA TEST) test strip USE TO TEST BLOOD SUGAR TWICE DAILY. Dx Code E11.49   hydrocortisone 2.5 % cream Apply topically 2 (two) times daily as needed (Rash).   ketoconazole (NIZORAL) 2 % cream Apply twice daily as needed   ketoconazole (NIZORAL) 2 % shampoo Apply to scalp 3 times per week. Leave in for a for minutes before rinsing.   latanoprost (XALATAN) 0.005 % ophthalmic solution Place 1 drop into both eyes Daily.   lisinopril (ZESTRIL) 40 MG tablet TAKE 1 TABLET BY MOUTH EVERY MORNING FORBLOOD PRESSURE   metFORMIN (GLUCOPHAGE) 1000 MG tablet TAKE 1 TABLETS BY MOUTH BEFORE BREAKFASTAND  THEN 1 TABLET BEFORE DINNER AND 1/2 TABLET AT BEDTIME FOR DIABETES.   mupirocin ointment (BACTROBAN) 2 % Apply 1 application topically 3 (three) times daily. Apply to open areas buttocks crease as directed   ONETOUCH DELICA LANCETS 40J MISC 1 Units by Does not apply route as needed. Use one to obtain blood sugar. Dx  E11.49   silver sulfADIAZINE (SILVADENE) 1 % cream Apply 1 application topically daily.   verapamil (CALAN-SR) 240 MG CR tablet TAKE 1 TABLET BY MOUTH EVERY MORNING FORBLOOD PRESSURE   vitamin E 400 UNIT capsule Take 400 Units by mouth daily.   No facility-administered encounter medications on file as of 08/13/2021.     Recent Relevant Labs: Lab Results  Component Value Date/Time   HGBA1C 7.6 (A) 03/20/2021 08:04 AM   HGBA1C 7.1 (A) 09/19/2020 08:20 AM   HGBA1C 8.1 (H) 03/07/2020 08:01 AM   HGBA1C 8.7 (H) 03/16/2019 08:45 AM   MICROALBUR 1.4 05/15/2011 09:01 AM   MICROALBUR 1.1 12/20/2008 12:00 AM    Kidney Function Lab Results  Component Value Date/Time   CREATININE 1.27 (H) 11/17/2020 05:17 PM   CREATININE 1.10 03/07/2020 08:01 AM   CREATININE 0.95 03/16/2019 08:45 AM   GFR 64.16 03/07/2020 08:01 AM   GFRNONAA 78.99 07/30/2010 10:42 AM   GFRAA 107 12/20/2008 12:00 AM     Contacted patient on 08/16/21 to discuss diabetes disease state.   Current antihyperglycemic regimen:  Metformin 1000 mg - 1 tablet at breakfast, 1 tablet at noon, 1/2 tablet at 5 PM Glimepiride 4 mg - 1 tablet twice daily    Patient verbally confirms he is taking the above medications as directed. Yes  What diet changes have been made to improve diabetes control? The patient states he watches his carbohydrate intake, uses sugar free products,   What recent interventions/DTPs have been made to improve glycemic control:  None identified  Have there been any recent hospitalizations or ED visits since last visit with CPP? No  Patient denies hypoglycemic symptoms, including Pale, Sweaty,  Shaky, Hungry, Nervous/irritable, and Vision changes  Patient denies hyperglycemic symptoms, including blurry vision, excessive thirst, fatigue, polyuria, and weakness  How often are you checking your blood sugar? once daily  What are your blood sugars ranging?  Fasting: 08/16/21- 144, the past few mornings fasting BG's-128,100,112,101  During the week, how often does your blood glucose drop below 70? Never  Are you checking your feet daily/regularly? Yes  Adherence Review: Is the patient currently on a STATIN medication? No Is the patient currently on ACE/ARB medication? Yes Does the patient have >5 day gap between last estimated fill dates? No  Care Gaps: Annual wellness visit in last year? Yes  09/22/20 Most recent A1C reading:7.6  03/20/21 Most Recent BP reading: 138/76  80-P  03/20/21  Last eye exam / retinopathy screening:07/06/21 Last diabetic foot exam:08/17/20  Counseled patient on importance of annual eye and foot exam.  The patient states he has a follow up with [facility] 08/30/21 and he also stated that he is very happy with [facility] pharmacy.  Star Rating Drugs:  Medication:  Last Fill: Day Supply Glimepiride 4mg          06/15/21            90 Lisinopril 40mg            06/15/21            90 Verapamil 240mg        07/10/21            30 Metformin 1000mg      06/15/21            90    PCP appointment on 09/25/21 and CCM appointment on 10/08/21  Michelle Adams, CPP notified   , CCMA Clincal Pharmacy Assistant [phone]  I have reviewed the care management and care coordination activities outlined in this encounter and I am certifying that I agree with the content of this note. No further action required.  Michelle Adams, PharmD Clinical Pharmacist [facility] [phone]  

## 2021-08-24 ENCOUNTER — Other Ambulatory Visit: Payer: Self-pay | Admitting: Internal Medicine

## 2021-08-24 DIAGNOSIS — F39 Unspecified mood [affective] disorder: Secondary | ICD-10-CM

## 2021-08-24 NOTE — Telephone Encounter (Signed)
Last filled 07-13-21 #60 Last OV 03-20-21 Next OV 09-25-21 [place]

## 2021-08-30 DIAGNOSIS — H401131 Primary open-angle glaucoma, bilateral, mild stage: Secondary | ICD-10-CM | POA: Diagnosis not present

## 2021-08-30 LAB — HM DIABETES EYE EXAM

## 2021-09-25 ENCOUNTER — Encounter: Payer: Self-pay | Admitting: Internal Medicine

## 2021-09-25 ENCOUNTER — Ambulatory Visit (INDEPENDENT_AMBULATORY_CARE_PROVIDER_SITE_OTHER): Payer: PPO | Admitting: Internal Medicine

## 2021-09-25 ENCOUNTER — Other Ambulatory Visit: Payer: Self-pay

## 2021-09-25 VITALS — BP 138/82 | HR 67 | Temp 98.0°F | Ht 68.0 in | Wt 190.0 lb

## 2021-09-25 DIAGNOSIS — F39 Unspecified mood [affective] disorder: Secondary | ICD-10-CM

## 2021-09-25 DIAGNOSIS — I10 Essential (primary) hypertension: Secondary | ICD-10-CM | POA: Diagnosis not present

## 2021-09-25 DIAGNOSIS — E1142 Type 2 diabetes mellitus with diabetic polyneuropathy: Secondary | ICD-10-CM

## 2021-09-25 DIAGNOSIS — K219 Gastro-esophageal reflux disease without esophagitis: Secondary | ICD-10-CM | POA: Diagnosis not present

## 2021-09-25 DIAGNOSIS — Z0000 Encounter for general adult medical examination without abnormal findings: Secondary | ICD-10-CM

## 2021-09-25 DIAGNOSIS — S069X0S Unspecified intracranial injury without loss of consciousness, sequela: Secondary | ICD-10-CM | POA: Diagnosis not present

## 2021-09-25 LAB — COMPREHENSIVE METABOLIC PANEL
ALT: 15 U/L (ref 0–53)
AST: 15 U/L (ref 0–37)
Albumin: 4.5 g/dL (ref 3.5–5.2)
Alkaline Phosphatase: 48 U/L (ref 39–117)
BUN: 29 mg/dL — ABNORMAL HIGH (ref 6–23)
CO2: 27 mEq/L (ref 19–32)
Calcium: 9.3 mg/dL (ref 8.4–10.5)
Chloride: 103 mEq/L (ref 96–112)
Creatinine, Ser: 1.18 mg/dL (ref 0.40–1.50)
GFR: 57.23 mL/min — ABNORMAL LOW (ref >60.00)
Glucose, Bld: 158 mg/dL — ABNORMAL HIGH (ref 70–99)
Potassium: 4.7 mEq/L (ref 3.5–5.1)
Sodium: 140 mEq/L (ref 135–145)
Total Bilirubin: 0.5 mg/dL (ref 0.2–1.2)
Total Protein: 7.2 g/dL (ref 6.0–8.3)

## 2021-09-25 LAB — T4, FREE: Free T4: 0.81 ng/dL (ref 0.60–1.60)

## 2021-09-25 LAB — LIPID PANEL
Cholesterol: 180 mg/dL (ref 0–200)
HDL: 37.3 mg/dL — ABNORMAL LOW (ref >39.00)
LDL Cholesterol: 112 mg/dL — ABNORMAL HIGH (ref 0–99)
NonHDL: 142.71
Total CHOL/HDL Ratio: 5
Triglycerides: 154 mg/dL — ABNORMAL HIGH (ref 0.0–149.0)
VLDL: 30.8 mg/dL (ref 0.0–40.0)

## 2021-09-25 LAB — CBC
HCT: 43.2 % (ref 39.0–52.0)
Hemoglobin: 14.4 g/dL (ref 13.0–17.0)
MCHC: 33.4 g/dL (ref 30.0–36.0)
MCV: 96 fl (ref 78.0–100.0)
Platelets: 236 10*3/uL (ref 150.0–400.0)
RBC: 4.5 Mil/uL (ref 4.22–5.81)
RDW: 12.9 % (ref 11.5–15.5)
WBC: 5.6 10*3/uL (ref 4.0–10.5)

## 2021-09-25 LAB — HEMOGLOBIN A1C: Hgb A1c MFr Bld: 8.1 % — ABNORMAL HIGH (ref 4.6–6.5)

## 2021-09-25 LAB — HM DIABETES FOOT EXAM

## 2021-09-25 MED ORDER — HYDROCHLOROTHIAZIDE 25 MG PO TABS: 25.0000 mg | ORAL_TABLET | Freq: Every day | ORAL | 3 refills | Status: DC

## 2021-09-25 NOTE — Patient Instructions (Signed)
Stop the verapamil. That should help reduce your leg swelling. Instead, take the new medication--HCTZ--in addition to your lisinopril.

## 2021-09-25 NOTE — Assessment & Plan Note (Signed)
I have personally reviewed the Medicare Annual Wellness questionnaire and have noted 1. The patient's medical and social history 2. Their use of alcohol, tobacco or illicit drugs 3. Their current medications and supplements 4. The patient's functional ability including ADL's, fall risks, home safety risks and hearing or visual             impairment. 5. Diet and physical activities 6. Evidence for depression or mood disorders  The patients weight, height, BMI and visual acuity have been recorded in the chart I have made referrals, counseling and provided education to the patient based review of the above and I have provided the pt with a written personalized care plan for preventive services.  I have provided you with a copy of your personalized plan for preventive services. Please take the time to review along with your updated medication list.  Does some exercise Recommended the bivalent COVID booster Did have flu vaccine No cancer screening due to age

## 2021-09-25 NOTE — Progress Notes (Signed)
Hearing Screening   250Hz  500Hz  1000Hz  2000Hz  4000Hz   Right ear 20 20 20 20 20   Left ear 20 20 20 20 20   Vision Screening - Comments:: Pt had last eye exam in October of 2022 at [facility].

## 2021-09-25 NOTE — Assessment & Plan Note (Signed)
Ongoing anxiety Uses the xanax regularly

## 2021-09-25 NOTE — Progress Notes (Signed)
Subjective:    Patient ID: Patrick [name], male    DOB: 1936-05-03, 85 y.o.   MRN: [id number]  HPI Here for Medicare wellness visit and follow up of chronic health conditions This visit occurred during the SARS-CoV-2 public health emergency.  Safety protocols were in place, including screening questions prior to the visit, additional usage of staff PPE, and extensive cleaning of exam room while observing appropriate contact time as indicated for disinfecting solutions.   Reviewed advanced directives Reviewed other doctors----Dr King--ophthal, Dr Gilles Chiquito, Dr Moye--derm, Dr Wandalee Ferdinand No hospitalizations or surgery in the past year Stays active with yard work---now has pedal machine he uses in the recliner No alcohol or tobacco Vision is okay--recent visit Hearing is fine No falls No depression or anhedonia---lonely at times living alone Independent with instrumental ADLs Stable memory issues--still [place] drive [place] interstate since [place] injury (gets vertigo)  Leg swelling is better but he notices "my legs give out on me" when working out in the yard Just gets weak--not really painful  No chest pain or SOB No dizziness or syncope Occasional headaches No palpitations  Notes rash on back Can't reach there---it is clearly the psoriasis  Checks sugars daily 91-200-----most under 150 No hypoglycemic reactions Uses the gabapentin for feet pain---at bedtime  Still uses the xanax for anxiety Uses it every evening---and often in the AM if he gets "nervous, hyper" Son in hospital with 3 operations----"under pressure" with this  Current Outpatient Medications on File Prior to Visit  Medication Sig Dispense Refill   ALPRAZolam (XANAX) 1 MG tablet TAKE 1 TABLET BY MOUTH TWICE (2) DAILY AS NEEDED 60 tablet 0   Ascorbic Acid (VITAMIN C) 1000 MG tablet Take 1,000 mg by mouth daily.     aspirin 81 MG tablet Take 81 mg by mouth daily.     betamethasone dipropionate  (DIPROLENE) 0.05 % ointment Apply topically 2 (two) times daily as needed (Rash). 45 g 0   Blood Glucose Monitoring Suppl (ONE TOUCH ULTRA 2) w/Device KIT SMARTSIG:1 Each Via Meter As Directed     cholecalciferol (VITAMIN D) 1000 units tablet Take 1,000 Units by mouth daily.     desonide (DESOWEN) 0.05 % lotion APPLY TO AFFECTED AREA ON FACE TWICE DAILY 118 mL 0   fish oil-omega-3 fatty acids 1000 MG capsule Take 2 g by mouth daily.     fluconazole (DIFLUCAN) 150 MG tablet Take 1 tablet (150 mg total) by mouth once a week. 4 tablet 2   gabapentin (NEURONTIN) 100 MG capsule TAKE 1 OR 2 CAPSULES BY MOUTH AT BEDTIME 60 capsule 11   glimepiride (AMARYL) 4 MG tablet TAKE 1 TABLET BY MOUTH TWICE A DAY FOR DIABETES 180 tablet 3   glucose blood (ONE TOUCH ULTRA TEST) test strip USE TO TEST BLOOD SUGAR TWICE DAILY. Dx Code E11.49 100 each 3   hydrocortisone 2.5 % cream Apply topically 2 (two) times daily as needed (Rash). 30 g 2   ketoconazole (NIZORAL) 2 % cream Apply twice daily as needed 60 g 1   ketoconazole (NIZORAL) 2 % shampoo Apply to scalp 3 times per week. Leave in for a for minutes before rinsing. 120 mL 0   latanoprost (XALATAN) 0.005 % ophthalmic solution Place 1 drop into both eyes Daily.     lisinopril (ZESTRIL) 40 MG tablet TAKE 1 TABLET BY MOUTH EVERY MORNING FORBLOOD PRESSURE 90 tablet 3   metFORMIN (GLUCOPHAGE) 1000 MG tablet TAKE 1 TABLETS BY MOUTH BEFORE BREAKFASTAND THEN 1 TABLET  BEFORE DINNER AND 1/2 TABLET AT BEDTIME FOR DIABETES. 235 tablet 3   mupirocin ointment (BACTROBAN) 2 % Apply 1 application topically 3 (three) times daily. Apply to open areas buttocks crease as directed 22 g 2   ONETOUCH DELICA LANCETS 97W MISC 1 Units by Does not apply route as needed. Use one to obtain blood sugar. Dx  E11.49 100 each 3   verapamil (CALAN-SR) 240 MG CR tablet TAKE 1 TABLET BY MOUTH EVERY MORNING FORBLOOD PRESSURE 90 tablet 3   vitamin E 400 UNIT capsule Take 400 Units by mouth daily.      No current facility-administered medications on file prior to visit.    No Known Allergies  Past Medical History:  Diagnosis Date   Anxiety    Diabetes mellitus    GERD (gastroesophageal reflux disease)    Hyperlipidemia    Hypertension    Irregular heartbeat    Dr. (his) stated that it was "nothing to worry about."   Peptic ulcer disease    Renal cyst    Rosacea     Past Surgical History:  Procedure Laterality Date   CHOLECYSTECTOMY     KNEE ARTHROSCOPY     left   PARTIAL GASTRECTOMY  1974    Family History  Problem Relation Age of Onset   Cancer Brother        prostate   Colon cancer Brother    Kidney failure Brother    Cancer Sister    Heart disease Brother    Cancer Brother        prostate cancer   Cancer Brother        prostate    Social History   Socioeconomic History   Marital status: Widowed    Spouse name: Not on file   Number of children: 2   Years of education: Not on file   Highest education level: Not on file  Occupational History   Occupation: RETIRED, Programmer, systems: RETIRED  Tobacco Use   Smoking status: Never   Smokeless tobacco: Never  Substance and Sexual Activity   Alcohol use: No   Drug use: Not on file   Sexual activity: Not on file  Other Topics Concern   Not on file  Social History Narrative   Now has living will   Son is health care POA--no alternate   Would accept resuscitation attempts   No tube feeds if cognitively unaware   Social Determinants of Health   Financial Resource Strain: Not on file  Food Insecurity: Not on file  Transportation Needs: Not on file  Physical Activity: Not on file  Stress: Not on file  Social Connections: Not on file  Intimate Partner Violence: Not on file   Review of Systems Appetite is fine Weight is up slightly Sleeps okay with gabapentin Uses seat belt Teeth are okay--keeps up with dentist Rare heartburn---uses gaviscon with success. No dysphagia Bowels move  fine---no blood Voids with good stream. Nocturia x 2 usually No sig back or joint pains    Objective:   Physical Exam Constitutional:      Appearance: Normal appearance.  HENT:     Mouth/Throat:     Comments: No lesions Eyes:     Conjunctiva/sclera: Conjunctivae normal.     Pupils: Pupils are equal, round, and reactive to light.  Cardiovascular:     Rate and Rhythm: Normal rate and regular rhythm.     Heart sounds: No murmur heard.   No gallop.  Comments: Faint pedal pulses Pulmonary:     Effort: Pulmonary effort is normal.     Breath sounds: Normal breath sounds. No wheezing or rales.  Abdominal:     Palpations: Abdomen is soft.     Tenderness: There is no abdominal tenderness.  Musculoskeletal:     Cervical back: Neck supple.     Comments: 1+ pitting edema in both feet  Lymphadenopathy:     Cervical: No cervical adenopathy.  Skin:    Findings: No lesion.     Comments: Large psoriasis patch in mid back No foot lesions  Neurological:     Mental Status: He is alert and oriented to person, place, and time.     Comments: President---"Obama---no----I wish Daisy Floro was-----" [id number] [id number] Recall 3/3  Slightly decreased sensation in feet  Psychiatric:        Mood and Affect: Mood normal.        Behavior: Behavior normal.           Assessment & Plan:

## 2021-09-25 NOTE — Assessment & Plan Note (Signed)
Ongoing stable cognitive issues since fall from roof (head injury) [place] the interstate--he avoids

## 2021-09-25 NOTE — Assessment & Plan Note (Signed)
Seems to have acceptable control on glimeride and metformin Gabapentin at bedtime helps neuropathy Will check labs

## 2021-09-25 NOTE — Assessment & Plan Note (Signed)
BP Readings from Last 3 Encounters:  09/25/21 138/82  03/20/21 138/76  12/21/20 (!) 167/85   Ongoing edema due to verapamil---will stop Start HCTZ instead Will recheck renal in 2-3 weeks

## 2021-09-25 NOTE — Assessment & Plan Note (Signed)
Uses gaviscon prn 

## 2021-09-26 ENCOUNTER — Other Ambulatory Visit: Payer: Self-pay | Admitting: Internal Medicine

## 2021-09-26 DIAGNOSIS — F39 Unspecified mood [affective] disorder: Secondary | ICD-10-CM

## 2021-09-26 NOTE — Telephone Encounter (Signed)
Refill request for ALPRAZOLAM 1 MG TAB  LOV - 09/25/21 Next OV - 03/26/22 Last refill - 08/24/21 #60/0

## 2021-10-01 ENCOUNTER — Telehealth: Payer: Self-pay | Admitting: *Deleted

## 2021-10-01 NOTE — Telephone Encounter (Signed)
Several CMAs called him about his lab results. I called and left results on VM per DPR.

## 2021-10-01 NOTE — Telephone Encounter (Signed)
Spoke to pt about labs 

## 2021-10-01 NOTE — Telephone Encounter (Signed)
Returning shannon phone call. Connected him to [facility].

## 2021-10-01 NOTE — Telephone Encounter (Signed)
PLEASE NOTE: All timestamps contained within this report are represented as [place] Standard Time. CONFIDENTIALTY NOTICE: This fax transmission is intended only for the addressee. It contains information that is legally privileged, confidential or otherwise protected from use or disclosure. If you are not the intended recipient, you are strictly prohibited from reviewing, disclosing, copying using or disseminating any of this information or taking any action in reliance on or regarding this information. If you have received this fax in error, please notify us immediately by telephone so that we can arrange for its return to [place]. Phone: [phone], Toll-Free: [phone], Fax: [phone] Page: 1 of 1 Call Id: [id number] [place] [facility] - Client Nonclinical Telephone Record  [facility] Client [place] [facility] - Client Client Site [place] Provider Viviana Simpler- MD Contact Type Call Who Is Calling Patient / Member / Family / Caregiver Caller Name Bell Phone Number [phone] Patient Name Patrick [name] Patient DOB 12/26/1935 Call Type Message Only Information Provided Reason for Call Request for General Office Information Initial Comment Caller states he is returning a call. Disp. Time Disposition Final User 09/29/2021 1:04:40 PM General Information Provided Yes Lawrence Santiago Call Closed By: Lawrence Santiago Transaction Date/Time: 09/29/2021 1:01:31 PM (ET)

## 2021-10-02 ENCOUNTER — Telehealth: Payer: Self-pay

## 2021-10-02 NOTE — Chronic Care Management (AMB) (Signed)
    Chronic Care Management Pharmacy Assistant   Name: Patrick [name]  MRN: [id number] DOB: 25-Dec-1935   Reason for Encounter: Reminder Call   Conditions to be addressed/monitored: HTN and DMII   Medications: Outpatient Encounter Medications as of 10/02/2021  Medication Sig   ALPRAZolam (XANAX) 1 MG tablet TAKE 1 TABLET BY MOUTH TWICE (2) DAILY AS NEEDED   Ascorbic Acid (VITAMIN C) 1000 MG tablet Take 1,000 mg by mouth daily.   aspirin 81 MG tablet Take 81 mg by mouth daily.   betamethasone dipropionate (DIPROLENE) 0.05 % ointment Apply topically 2 (two) times daily as needed (Rash).   Blood Glucose Monitoring Suppl (ONE TOUCH ULTRA 2) w/Device KIT SMARTSIG:1 Each Via Meter As Directed   cholecalciferol (VITAMIN D) 1000 units tablet Take 1,000 Units by mouth daily.   desonide (DESOWEN) 0.05 % lotion APPLY TO AFFECTED AREA ON FACE TWICE DAILY   fish oil-omega-3 fatty acids 1000 MG capsule Take 2 g by mouth daily.   fluconazole (DIFLUCAN) 150 MG tablet Take 1 tablet (150 mg total) by mouth once a week.   gabapentin (NEURONTIN) 100 MG capsule TAKE 1 OR 2 CAPSULES BY MOUTH AT BEDTIME   glimepiride (AMARYL) 4 MG tablet TAKE 1 TABLET BY MOUTH TWICE A DAY FOR DIABETES   glucose blood (ONE TOUCH ULTRA TEST) test strip USE TO TEST BLOOD SUGAR TWICE DAILY. Dx Code E11.49   hydrochlorothiazide (HYDRODIURIL) 25 MG tablet Take 1 tablet (25 mg total) by mouth daily.   hydrocortisone 2.5 % cream Apply topically 2 (two) times daily as needed (Rash).   ketoconazole (NIZORAL) 2 % cream Apply twice daily as needed   ketoconazole (NIZORAL) 2 % shampoo Apply to scalp 3 times per week. Leave in for a for minutes before rinsing.   latanoprost (XALATAN) 0.005 % ophthalmic solution Place 1 drop into both eyes Daily.   lisinopril (ZESTRIL) 40 MG tablet TAKE 1 TABLET BY MOUTH EVERY MORNING FORBLOOD PRESSURE   metFORMIN (GLUCOPHAGE) 1000 MG tablet TAKE 1 TABLETS BY MOUTH BEFORE BREAKFASTAND THEN 1 TABLET  BEFORE DINNER AND 1/2 TABLET AT BEDTIME FOR DIABETES.   mupirocin ointment (BACTROBAN) 2 % Apply 1 application topically 3 (three) times daily. Apply to open areas buttocks crease as directed   ONETOUCH DELICA LANCETS 47W MISC 1 Units by Does not apply route as needed. Use one to obtain blood sugar. Dx  E11.49   vitamin E 400 UNIT capsule Take 400 Units by mouth daily.   No facility-administered encounter medications on file as of 10/02/2021.    Patrick [name] was contacted to remind him of his upcoming telephone visit with Debbora Dus on 10/08/21 at 9:00am. Patient was reminded to have all medications, supplements and any blood glucose and blood pressure readings available for review at appointment.   Are you having any problems with your medications? No  Do you have any concerns you like to discuss with the pharmacist? Yes The patient states he is not interested in talking to the pharmacist any longer about medications or conditions.    Star Rating Drugs: Medication:  Last Fill: Day Supply Lisinopril $RemoveBeforeDEI'40mg'[id number]$  09/04/21 90 Glimepiride $RemoveBefore'4mg'[id number]$  09/04/21 90 Metformin $RemoveBefo'1000mg'[id number]$  09/04/21 [place], [facility] notified  Avel Sensor, [place] Assistant [phone]  Total time spent for month CPA: 10 min

## 2021-10-08 ENCOUNTER — Telehealth: Payer: PPO

## 2021-10-08 NOTE — Telephone Encounter (Signed)
Per patient request, he will be unenrolled from CCM services effective 10/27/21. He may re-enroll with provider referral at any time.  Debbora Dus, PharmD Clinical Pharmacist Practitioner [facility] at [facility] [phone]

## 2021-10-09 ENCOUNTER — Other Ambulatory Visit: Payer: Self-pay

## 2021-10-09 ENCOUNTER — Other Ambulatory Visit (INDEPENDENT_AMBULATORY_CARE_PROVIDER_SITE_OTHER): Payer: PPO

## 2021-10-09 DIAGNOSIS — I10 Essential (primary) hypertension: Secondary | ICD-10-CM

## 2021-10-09 LAB — RENAL FUNCTION PANEL
Albumin: 4.3 g/dL (ref 3.5–5.2)
BUN: 40 mg/dL — ABNORMAL HIGH (ref 6–23)
CO2: 26 mEq/L (ref 19–32)
Calcium: 9.8 mg/dL (ref 8.4–10.5)
Chloride: 97 mEq/L (ref 96–112)
Creatinine, Ser: 1.34 mg/dL (ref 0.40–1.50)
GFR: 49.12 mL/min — ABNORMAL LOW (ref >60.00)
Glucose, Bld: 110 mg/dL — ABNORMAL HIGH (ref 70–99)
Phosphorus: 3.2 mg/dL (ref 2.3–4.6)
Potassium: 4.3 mEq/L (ref 3.5–5.1)
Sodium: 136 mEq/L (ref 135–145)

## 2021-10-16 ENCOUNTER — Ambulatory Visit: Payer: PPO | Admitting: Dermatology

## 2021-10-16 ENCOUNTER — Other Ambulatory Visit: Payer: Self-pay

## 2021-10-16 DIAGNOSIS — L820 Inflamed seborrheic keratosis: Secondary | ICD-10-CM

## 2021-10-16 DIAGNOSIS — Z79899 Other long term (current) drug therapy: Secondary | ICD-10-CM | POA: Diagnosis not present

## 2021-10-16 DIAGNOSIS — L821 Other seborrheic keratosis: Secondary | ICD-10-CM

## 2021-10-16 DIAGNOSIS — R21 Rash and other nonspecific skin eruption: Secondary | ICD-10-CM

## 2021-10-16 DIAGNOSIS — L409 Psoriasis, unspecified: Secondary | ICD-10-CM | POA: Diagnosis not present

## 2021-10-16 DIAGNOSIS — L578 Other skin changes due to chronic exposure to nonionizing radiation: Secondary | ICD-10-CM

## 2021-10-16 MED ORDER — BETAMETHASONE DIPROPIONATE 0.05 % EX OINT: TOPICAL_OINTMENT | CUTANEOUS | 0 refills | Status: DC

## 2021-10-16 NOTE — Patient Instructions (Signed)
If You Need Anything After Your Visit ° °If you have any questions or concerns for your doctor, please call our main line at [phone] and press option 4 to reach your doctor's medical assistant. If no one answers, please leave a voicemail as directed and we will return your call as soon as possible. Messages left after 4 pm will be answered the following business day.  ° °You may also send us a message via MyChart. We typically respond to MyChart messages within 1-2 business days. ° °For prescription refills, please ask your pharmacy to contact our office. Our fax number is [phone]. ° °If you have an urgent issue when the clinic is closed that cannot wait until the next business day, you can page your doctor at the number below.   ° °Please note that while we do our best to be available for urgent issues outside of office hours, we are not available 24/7.  ° °If you have an urgent issue and are unable to reach us, you may choose to seek medical care at your doctor's office, retail clinic, urgent care center, or emergency room. ° °If you have a medical emergency, please immediately call 911 or go to the emergency department. ° °Pager Numbers ° °- Dr. Kowalski: [phone] ° °- Dr. Moye: [phone] ° °- Dr. Stewart: [phone] ° °In the event of inclement weather, please call our main line at [phone] for an update on the status of any delays or closures. ° °Dermatology Medication Tips: °Please keep the boxes that topical medications come in in order to help keep track of the instructions about where and how to use these. Pharmacies typically print the medication instructions only on the boxes and not directly on the medication tubes.  ° °If your medication is too expensive, please contact our office at [phone] option 4 or send us a message through MyChart.  ° °We are unable to tell what your co-pay for medications will be in advance as this is different depending on your insurance coverage.  However, we may be able to find a substitute medication at lower cost or fill out paperwork to get insurance to cover a needed medication.  ° °If a prior authorization is required to get your medication covered by your insurance company, please allow us 1-2 business days to complete this process. ° °Drug prices often vary depending on where the prescription is filled and some pharmacies may offer cheaper prices. ° °The website www.goodrx.com contains coupons for medications through different pharmacies. The prices here do not account for what the cost may be with help from insurance (it may be cheaper with your insurance), but the website can give you the price if you did not use any insurance.  °- You can print the associated coupon and take it with your prescription to the pharmacy.  °- You may also stop by our office during regular business hours and pick up a GoodRx coupon card.  °- If you need your prescription sent electronically to a different pharmacy, notify our office through [facility] MyChart or by phone at [phone] option 4. ° ° ° ° °Si Usted Necesita Algo Después de Su Visita ° °También puede enviarnos un mensaje a través de MyChart. Por lo general respondemos a los mensajes de MyChart en el transcurso de 1 a 2 días hábiles. ° °Para renovar recetas, por favor pida a su farmacia que se ponga en contacto con nuestra oficina. Nuestro número de fax es el [phone]. ° °Si tiene   un asunto urgente cuando la clnica est cerrada y que no puede esperar hasta el siguiente da hbil, puede llamar/localizar a su doctor(a) al nmero que aparece a continuacin.   Por favor, tenga en cuenta que aunque hacemos todo lo posible para estar disponibles para asuntos urgentes fuera del horario de [place], no estamos disponibles las 24 horas del da, los 7 das de la [place].   Si tiene un problema urgente y no puede comunicarse con nosotros, puede optar por buscar atencin mdica  en el consultorio de su  doctor(a), en una clnica privada, en un centro de atencin urgente o en una sala de emergencias.  Si tiene Engineering geologist, por favor llame inmediatamente al 911 o vaya a la sala de emergencias.  Nmeros de bper  - Dr. Nehemiah Massed: [phone]  - Dra. Moye: [phone]  - Dra. Nicole Kindred: [phone]  En caso de inclemencias del [place], por favor llame a Johnsie Kindred principal al [phone] para una actualizacin sobre el [place] de cualquier retraso o cierre.  Consejos para la medicacin en dermatologa: Por favor, guarde las cajas en las que vienen los medicamentos de uso tpico para ayudarle a seguir las instrucciones sobre dnde y cmo usarlos. Las farmacias generalmente imprimen las instrucciones del medicamento slo en las cajas y no directamente en los tubos del [place].   Si su medicamento es muy caro, por favor, pngase en contacto con Zigmund Daniel llamando al [phone] y presione la opcin 4 o envenos un mensaje a travs de Pharmacist, community.   No podemos decirle cul ser su copago por los medicamentos por adelantado ya que esto es diferente dependiendo de la cobertura de su seguro. Sin embargo, es posible que podamos encontrar un medicamento sustituto a Electrical engineer un formulario para que el seguro cubra el medicamento que se considera necesario.   Si se requiere una autorizacin previa para que su compaa de seguros [place] su medicamento, por favor permtanos de 1 a 2 das hbiles para completar este proceso.  Los precios de los medicamentos varan con frecuencia dependiendo del Environmental consultant de dnde se surte la receta y alguna farmacias pueden ofrecer precios ms baratos.  El sitio web www.goodrx.com tiene cupones para medicamentos de Airline pilot. Los precios aqu no tienen en cuenta lo que podra costar con la ayuda del seguro (puede ser ms barato con su seguro), pero el sitio web puede darle el precio si no utiliz Research scientist (physical sciences).  - Puede imprimir el cupn  correspondiente y llevarlo con su receta a la farmacia.  - Tambin puede pasar por nuestra oficina durante el horario de atencin regular y Charity fundraiser una tarjeta de cupones de GoodRx.  - Si necesita que su receta se enve electrnicamente a una farmacia diferente, informe a nuestra oficina a travs de MyChart de [place] o por telfono llamando al [phone] y presione la opcin 4.  Side effects of Otezla (apremilast) include diarrhea, nausea, headache, upper respiratory infection, depression, and weight decrease (5-10%). It should only be taken by pregnant women after a discussion regarding risks and benefits with their doctor. Goal is control of skin condition, not cure.  The use of Rutherford Nail requires long term medication management, including periodic office visits.

## 2021-10-16 NOTE — Progress Notes (Signed)
Follow-Up Visit   Subjective  Patrick [name] is a 85 y.o. male who presents for the following: psoriasis (On the back, chest, buttocks, and buttocks crease - patient is currently using a "salve" Dr. Silvio Pate prescribed him, but he is unsure of the name of it. He states it has helped some, but condition continue to flare. He would like to discuss other treatment options. He is under a lot of stress taking care of his older sister, and his son overcoming recent surgery and illness over the past three or so months. ). He also has several irritating itchy spots on the back he would like treated today. The patient has spots, moles and lesions to be evaluated, some may be new or changing and the patient has concerns that these could be cancer.  The following portions of the chart were reviewed this encounter and updated as appropriate:   Tobacco   Allergies   Meds   Problems   Med Hx   Surg Hx   Fam Hx      Review of Systems:  No other skin or systemic complaints except as noted in HPI or Assessment and Plan.  Objective  Well appearing patient in no apparent distress; mood and affect are within normal limits.  A focused examination was performed including the face, trunk, and extremities. Relevant physical exam findings are noted in the Assessment and Plan.  Trunk, extremities, scalp, buttocks, buttocks crease Well-demarcated erythematous papules/plaques with silvery scale.        Back x 17 (17) Erythematous stuck-on, waxy papule or plaque   Assessment & Plan  Psoriasis -flared possibly due to stress Trunk, extremities, scalp, buttocks, buttocks crease BSA - 15% Psoriasis is a chronic non-curable, but treatable genetic/hereditary disease that may have other systemic features affecting other organ systems such as joints (Psoriatic Arthritis). It is associated with an increased risk of inflammatory bowel disease, heart disease, non-alcoholic fatty liver disease, and depression.     Start oral Otezla systemic treatment. Due to extensive involvement samples given (#3) of [place] patient to titrate up to 30mg  po BID. Side effects of Otezla (apremilast) include diarrhea, nausea, headache, upper respiratory infection, depression, and weight decrease (5-10%). It should only be taken by pregnant women after a discussion regarding risks and benefits with their doctor. Goal is control of skin condition, not cure.  The use of Rutherford Nail requires long term medication management, including periodic office visits. Pamphlet given for [facility].   Advised patient that if he is experiencing significant side effects from [place] he should contact our office for further instruction.   Continue Betamethasone ointment to aa's psoriasis QD-BID PRN. Topical steroids (such as triamcinolone, fluocinolone, fluocinonide, mometasone, clobetasol, halobetasol, betamethasone, hydrocortisone) can cause thinning and lightening of the skin if they are used for too long in the same area. Your physician has selected the right strength medicine for your problem and area affected on the body. Please use your medication only as directed by your physician to prevent side effects.   Inflamed seborrheic keratosis (17) Back x 17 Destruction of lesion - Back x 17 Complexity: simple   Destruction method: cryotherapy   Informed consent: discussed and consent obtained   Timeout:  patient name, date of birth, surgical site, and procedure verified Lesion destroyed using liquid nitrogen: Yes   Region frozen until ice ball extended beyond lesion: Yes   Outcome: patient tolerated procedure well with no complications   Post-procedure details: wound care instructions given  Seborrheic Keratoses - Stuck-on, waxy, tan-brown papules and/or plaques  - Benign-appearing - Discussed benign etiology and prognosis. - Observe - Call for any changes  Actinic Damage - chronic, secondary to cumulative UV radiation  exposure/sun exposure over time - diffuse scaly erythematous macules with underlying dyspigmentation - Recommend daily broad spectrum sunscreen SPF 30+ to sun-exposed areas, reapply every 2 hours as needed.  - Recommend staying in the shade or wearing long sleeves, sun glasses (UVA+UVB protection) and wide brim hats (4-inch brim around the entire circumference of the hat). - Call for new or changing lesions.  Return for psoriasis f/u in 2-4 weeks with Dr. Laurence Ferrari.  Luther Redo, CMA, am acting as scribe for Sarina Ser, MD . Documentation: I have reviewed the above documentation for accuracy and completeness, and I agree with the above.  Sarina Ser, MD

## 2021-10-25 ENCOUNTER — Other Ambulatory Visit: Payer: Self-pay | Admitting: Internal Medicine

## 2021-10-25 DIAGNOSIS — F39 Unspecified mood [affective] disorder: Secondary | ICD-10-CM

## 2021-10-25 NOTE — Telephone Encounter (Signed)
Last filled 09-26-21 #90 Last OV 09-25-21 Next OV 03-26-22 [place]

## 2021-10-28 ENCOUNTER — Encounter: Payer: Self-pay | Admitting: Dermatology

## 2021-11-26 ENCOUNTER — Other Ambulatory Visit: Payer: Self-pay | Admitting: Internal Medicine

## 2021-11-26 DIAGNOSIS — F39 Unspecified mood [affective] disorder: Secondary | ICD-10-CM

## 2021-11-26 NOTE — Telephone Encounter (Signed)
Last filled 10-26-21 #90 Last OV 09-25-21 Next OV 03-26-22 [place]

## 2021-11-27 ENCOUNTER — Ambulatory Visit: Payer: PPO | Admitting: Dermatology

## 2021-11-27 ENCOUNTER — Telehealth: Payer: Self-pay

## 2021-11-27 ENCOUNTER — Encounter: Payer: Self-pay | Admitting: Dermatology

## 2021-11-27 ENCOUNTER — Other Ambulatory Visit: Payer: Self-pay

## 2021-11-27 DIAGNOSIS — D492 Neoplasm of unspecified behavior of bone, soft tissue, and skin: Secondary | ICD-10-CM

## 2021-11-27 DIAGNOSIS — L409 Psoriasis, unspecified: Secondary | ICD-10-CM | POA: Diagnosis not present

## 2021-11-27 DIAGNOSIS — C4491 Basal cell carcinoma of skin, unspecified: Secondary | ICD-10-CM

## 2021-11-27 DIAGNOSIS — L820 Inflamed seborrheic keratosis: Secondary | ICD-10-CM

## 2021-11-27 DIAGNOSIS — R21 Rash and other nonspecific skin eruption: Secondary | ICD-10-CM | POA: Diagnosis not present

## 2021-11-27 DIAGNOSIS — C4441 Basal cell carcinoma of skin of scalp and neck: Secondary | ICD-10-CM | POA: Diagnosis not present

## 2021-11-27 HISTORY — DX: Basal cell carcinoma of skin, unspecified: C44.91

## 2021-11-27 MED ORDER — CLOBETASOL PROPIONATE 0.05 % EX OINT: 1.0000 "application " | TOPICAL_OINTMENT | Freq: Two times a day (BID) | CUTANEOUS | 1 refills | Status: DC

## 2021-11-27 MED ORDER — BETAMETHASONE DIPROPIONATE 0.05 % EX OINT: TOPICAL_OINTMENT | CUTANEOUS | 2 refills | Status: AC

## 2021-11-27 MED ORDER — CLOBETASOL PROPIONATE 0.05 % EX SOLN: 1.0000 "application " | Freq: Two times a day (BID) | CUTANEOUS | 0 refills | Status: DC

## 2021-11-27 NOTE — Progress Notes (Signed)
Follow-Up Visit   Subjective  Patrick [name] is a 86 y.o. male who presents for the following: Psoriasis (5 week recheck. [name] as directed (had 2 packs). Finished last week. Did not help, per patient. Uses Betamethasone ointment, needs Rf. Denies joint pain).  The following portions of the chart were reviewed this encounter and updated as appropriate:  Tobacco   Allergies   Meds   Problems   Med Hx   Surg Hx   Fam Hx       Review of Systems: No other skin or systemic complaints except as noted in HPI or Assessment and Plan.   Objective  Well appearing patient in no apparent distress; mood and affect are within normal limits.  A focused examination was performed including head, torso, limbs. Relevant physical exam findings are noted in the Assessment and Plan.  scalp, back, abdomen, gluteal crease Widespread scaly pink plaques at back. Slightly eroded scaly pink plaque within area of fixed erythema at mid buttocks  Right Axilla x1, left post auricular x1 (2) Erythematous keratotic or waxy stuck-on plaque.  Left Posterior Neck 1.6cm scaly pink plaque with telangiectasia       Assessment & Plan  Psoriasis scalp, back, abdomen, gluteal crease  Chronic and persistent condition with duration or expected duration over one year. Condition is bothersome/symptomatic for patient. Currently flared.  Has failed Otezla but did well with betamethasone in past.   Psoriasis is a chronic non-curable, but treatable genetic/hereditary disease that may have other systemic features affecting other organ systems such as joints (Psoriatic Arthritis). It is associated with an increased risk of inflammatory bowel disease, heart disease, non-alcoholic fatty liver disease, and depression.    Continue Betamethasone ointment twice daily up to 2 weeks. Avoid applying to face, groin, and axilla. Use as directed. Long-term use can cause thinning of the skin.  If not clear within 2 weeks will  submit for Vtama cream to be applied once daily. If [name] is denied, would consider Enstilar foam vs separate Calcipotriene on week days with Betamethasone on weekends.   Start Clobetasol solution twice daily to scalp/ears up to 2 weeks as needed. Avoid applying to face, groin, and axilla. Use as directed. Long-term use can cause thinning of the skin.   Favor stage 1 pressure changes with superimposed psoriasis at buttocks, comes and goes per patient. Recommend a donut pillow and moving often to keep pressure off the area.   Use Betamethasone as directed to area on buttocks until Improved then use Zinc Oxide Paste daliy to TID to protect the area.   Topical steroids (such as triamcinolone, fluocinolone, fluocinonide, mometasone, clobetasol, halobetasol, betamethasone, hydrocortisone) can cause thinning and lightening of the skin if they are used for too long in the same area. Your physician has selected the right strength medicine for your problem and area affected on the body. Please use your medication only as directed by your physician to prevent side effects.    Inflamed seborrheic keratosis (2) Right Axilla x1, left post auricular x1  Symptomatic  Prior to procedure, discussed risks of blister formation, small wound, skin dyspigmentation, or rare scar following cryotherapy. Recommend Vaseline ointment to treated areas while healing.   Destruction of lesion - Right Axilla x1, left post auricular x1  Destruction method: cryotherapy   Informed consent: discussed and consent obtained   Lesion destroyed using liquid nitrogen: Yes   Outcome: patient tolerated procedure well with no complications   Post-procedure details: wound care instructions given  Rash  Related Medications hydrocortisone 2.5 % cream Apply topically 2 (two) times daily as needed (Rash).  betamethasone dipropionate (DIPROLENE) 0.05 % ointment Apply to aa's psoriasis QD-BID PRN. Avoid the face, groin, and  axilla.  Neoplasm of skin Left Posterior Neck  Skin / nail biopsy Type of biopsy: tangential   Informed consent: discussed and consent obtained   Timeout: patient name, date of birth, surgical site, and procedure verified   Procedure prep:  Patient was prepped and draped in usual sterile fashion Prep type:  Isopropyl alcohol Anesthesia: the lesion was anesthetized in a standard fashion   Anesthetic:  1% lidocaine w/ epinephrine 1-100,000 buffered w/ 8.4% NaHCO3 Instrument used: flexible razor blade   Hemostasis achieved with: pressure, aluminum chloride and electrodesiccation   Outcome: patient tolerated procedure well   Post-procedure details: sterile dressing applied and wound care instructions given   Dressing type: bandage and petrolatum    Specimen 1 - Surgical pathology Differential Diagnosis: R/O BCC  Check Margins: No   Return in about 6 months (around 05/27/2022) for Psoriasis Follow Up, TBSE next available with Dr. Laurence Ferrari.  I, Emelia Salisbury, CMA, am acting as scribe for Forest Gleason, MD.  Documentation: I have reviewed the above documentation for accuracy and completeness, and I agree with the above.  Forest Gleason, MD

## 2021-11-27 NOTE — Telephone Encounter (Signed)
Chris from [facility] called and stated that  1) the betamethasone dipropionate ointment only comes in a 15 gram tube or a 45g, not the 50 that was sent in.  2) He stated that this copay could be over $50 for patient. He offered that he could get betamethasone dipropionate augmented cream for a 50 gram tube for around $10.  Please advise.

## 2021-11-27 NOTE — Telephone Encounter (Signed)
Recommend clobetasol ointment or foam if they can be gotten for cheaper. Otherwise, ok to use the betamethasone cream instead and recommend patient cover with a layer of vaseline after applying the cream. Thank you!

## 2021-11-27 NOTE — Patient Instructions (Addendum)
Use Betamethasone as directed to area on buttocks until improved then use Zinc Oxide Paste (example: Desitin, diaper rash cream).   Continue Betamethasone ointment twice daily up to 2 weeks to affected areas of psoriasis. Avoid applying to face, groin, and axilla. Use as directed. Long-term use can cause thinning of the skin.  Call in 2 weeks if not clearing on body with Betamethasone.   Start Clobetasol solution twice daily to scalp/ears up to 2 weeks as needed. Avoid applying to face, groin, and axilla. Use as directed. Long-term use can cause thinning of the skin.    Topical steroids (such as triamcinolone, fluocinolone, fluocinonide, mometasone, clobetasol, halobetasol, betamethasone, hydrocortisone) can cause thinning and lightening of the skin if they are used for too long in the same area. Your physician has selected the right strength medicine for your problem and area affected on the body. Please use your medication only as directed by your physician to prevent side effects.    Cryotherapy Aftercare  Wash gently with soap and water everyday.   Apply Vaseline and Band-Aid daily until healed.   Prior to procedure, discussed risks of blister formation, small wound, skin dyspigmentation, or rare scar following cryotherapy. Recommend Vaseline ointment to treated areas while healing.   Wound Care Instructions  Cleanse wound gently with soap and water once a day then pat dry with clean gauze. Apply a thing coat of Petrolatum (petroleum jelly, "Vaseline") over the wound (unless you have an allergy to this). We recommend that you use a new, sterile tube of Vaseline. Do not pick or remove scabs. Do not remove the yellow or white "healing tissue" from the base of the wound.  Cover the wound with fresh, clean, nonstick gauze and secure with paper tape. You may use Band-Aids in place of gauze and tape if the would is small enough, but would recommend trimming much of the tape off as there is often  too much. Sometimes Band-Aids can irritate the skin.  You should call the office for your biopsy report after 1 week if you have not already been contacted.  If you experience any problems, such as abnormal amounts of bleeding, swelling, significant bruising, significant pain, or evidence of infection, please call the office immediately.  FOR ADULT SURGERY PATIENTS: If you need something for pain relief you may take 1 extra strength Tylenol (acetaminophen) AND 2 Ibuprofen (200mg  each) together every 4 hours as needed for pain. (do not take these if you are allergic to them or if you have a reason you should not take them.) Typically, you may only need pain medication for 1 to 3 days.     If You Need Anything After Your Visit  If you have any questions or concerns for your doctor, please call our main line at [phone] and press option 4 to reach your doctor's medical assistant. If no one answers, please leave a voicemail as directed and we will return your call as soon as possible. Messages left after 4 pm will be answered the following business day.   You may also send [place] a message via [place]. We typically respond to MyChart messages within 1-2 business days.  For prescription refills, please ask your pharmacy to contact our office. Our fax number is [phone].  If you have an urgent issue when the clinic is closed that cannot wait until the next business day, you can page your doctor at the number below.    Please note that while we do our best to  be available for urgent issues outside of office hours, we are not available 24/7.   If you have an urgent issue and are unable to reach [place], you may choose to seek medical care at your doctor's office, retail clinic, urgent care center, or emergency room.  If you have a medical emergency, please immediately call 911 or go to the emergency department.  Pager Numbers  - Dr. Nehemiah Massed: [phone]  - Dr. Laurence Ferrari: [phone]  - Dr.  Nicole Kindred: [phone]  In the event of inclement weather, please call our main line at [phone] for an update on the status of any delays or closures.  Dermatology Medication Tips: Please keep the boxes that topical medications come in in order to help keep track of the instructions about where and how to use these. Pharmacies typically print the medication instructions only on the boxes and not directly on the medication tubes.   If your medication is too expensive, please contact our office at [phone] option 4 or send [place] a message through [place].   We are unable to tell what your co-pay for medications will be in advance as this is different depending on your insurance coverage. However, we may be able to find a substitute medication at lower cost or fill out paperwork to get insurance to cover a needed medication.   If a prior authorization is required to get your medication covered by your insurance company, please allow [place] 1-2 business days to complete this process.  Drug prices often vary depending on where the prescription is filled and some pharmacies may offer cheaper prices.  The website www.goodrx.com contains coupons for medications through different pharmacies. The prices here do not account for what the cost may be with help from insurance (it may be cheaper with your insurance), but the website can give you the price if you did not use any insurance.  - You can print the associated coupon and take it with your prescription to the pharmacy.  - You may also stop by our office during regular business hours and pick up a GoodRx coupon card.  - If you need your prescription sent electronically to a different pharmacy, notify our office through [facility] or by phone at [phone] option 4.     Si Usted Necesita Algo Despus de Su Visita  Tambin puede enviarnos un mensaje a travs de Pharmacist, community. Por lo general respondemos a los mensajes de MyChart en el transcurso  de 1 a 2 das hbiles.  Para renovar recetas, por favor pida a su farmacia que se ponga en contacto con nuestra oficina. Harland Dingwall de fax es [place] [phone].  Si tiene un asunto urgente cuando la clnica est cerrada y que no puede esperar hasta el siguiente da hbil, puede llamar/localizar a su doctor(a) al nmero que aparece a continuacin.   Por favor, tenga en cuenta que aunque hacemos todo lo posible para estar disponibles para asuntos urgentes fuera del horario de [place], no estamos disponibles las 24 horas del da, los 7 das de la [place].   Si tiene un problema urgente y no puede comunicarse con nosotros, puede optar por buscar atencin mdica  en el consultorio de su doctor(a), en una clnica privada, en un centro de atencin urgente o en una sala de emergencias.  Si tiene Engineering geologist, por favor llame inmediatamente al 911 o vaya a la sala de emergencias.  Nmeros de bper  - Dr. Nehemiah Massed: [phone]  - Dra. Moye: [phone]  - Dra.  Nicole Kindred: [phone]  En caso de inclemencias del [place], por favor llame a Johnsie Kindred principal al [phone] para una actualizacin sobre el [place] de cualquier retraso o cierre.  Consejos para la medicacin en dermatologa: Por favor, guarde las cajas en las que vienen los medicamentos de uso tpico para ayudarle a seguir las instrucciones sobre dnde y cmo usarlos. Las farmacias generalmente imprimen las instrucciones del medicamento slo en las cajas y no directamente en los tubos del [place].   Si su medicamento es muy caro, por favor, pngase en contacto con Zigmund Daniel llamando al [phone] y presione la opcin 4 o envenos un mensaje a travs de Pharmacist, community.   No podemos decirle cul ser su copago por los medicamentos por adelantado ya que esto es diferente dependiendo de la cobertura de su seguro. Sin embargo, es posible que podamos encontrar un medicamento sustituto a Electrical engineer un formulario  para que el seguro cubra el medicamento que se considera necesario.   Si se requiere una autorizacin previa para que su compaa de seguros [place] su medicamento, por favor permtanos de 1 a 2 das hbiles para completar este proceso.  Los precios de los medicamentos varan con frecuencia dependiendo del Environmental consultant de dnde se surte la receta y alguna farmacias pueden ofrecer precios ms baratos.  El sitio web www.goodrx.com tiene cupones para medicamentos de Airline pilot. Los precios aqu no tienen en cuenta lo que podra costar con la ayuda del seguro (puede ser ms barato con su seguro), pero el sitio web puede darle el precio si no utiliz Research scientist (physical sciences).  - Puede imprimir el cupn correspondiente y llevarlo con su receta a la farmacia.  - Tambin puede pasar por nuestra oficina durante el horario de atencin regular y Charity fundraiser una tarjeta de cupones de GoodRx.  - Si necesita que su receta se enve electrnicamente a una farmacia diferente, informe a nuestra oficina a travs de MyChart de [place] o por telfono llamando al [phone] y presione la opcin 4.

## 2021-11-27 NOTE — Telephone Encounter (Signed)
Clobetasol Ointment sent in and approved with a $9 copay and Clobetasol Solution sent in for scalp and ears with a $30 copay.  Confirmed coverage with Gerald Stabs at [facility].

## 2021-11-29 ENCOUNTER — Telehealth: Payer: Self-pay

## 2021-11-29 NOTE — Telephone Encounter (Signed)
-----   Message from Alfonso Patten, MD sent at 11/28/2021  5:24 PM EST ----- Skin , left posterior neck BASAL CELL CARCINOMA, NODULAR AND INFILTRATIVE PATTERNS, PERIPHERAL MARGIN INVOLVED -->   Discussed Mohs vs excision vs ED&C. Advised with excision or ED&C that there is higher risk of the spot not being cured and needing to send for Mohs. He strongly prefers to have treatment here and prefers less invasive.   EDC would leave a round depressed whitish scar a bit larger than the original lesion.  It is treated here in office in a procedure we call "scrape and burn".  No further pathology would be performed.  May be less than 80% cure rate with this infiltrative type of BCC. Mohs would leave a linear scar and pathology would be done at time of procedure to ensure complete removal.  It has a 98-99% cure rate. We would refer patient to a specialist for Mohs surgery. Excision would be done here in the office and the specimen sent to check under the microscope. If the spot was not clear on pathology, we would refer for Mohs for another surgery.  Patient prefers less invasive and treatment here - opts for [facility].  MAs please call to schedule for [facility]. Thank you!

## 2021-11-29 NOTE — Telephone Encounter (Signed)
Spoke with patient, scheduled for [facility] 12/18/2021 at 9:10am. Lurlean Horns, RMA

## 2021-12-05 ENCOUNTER — Encounter: Payer: Self-pay | Admitting: Dermatology

## 2021-12-18 ENCOUNTER — Other Ambulatory Visit: Payer: Self-pay

## 2021-12-18 ENCOUNTER — Ambulatory Visit: Payer: PPO | Admitting: Dermatology

## 2021-12-18 ENCOUNTER — Encounter: Payer: Self-pay | Admitting: Dermatology

## 2021-12-18 DIAGNOSIS — C4441 Basal cell carcinoma of skin of scalp and neck: Secondary | ICD-10-CM | POA: Diagnosis not present

## 2021-12-18 DIAGNOSIS — L89302 Pressure ulcer of unspecified buttock, stage 2: Secondary | ICD-10-CM

## 2021-12-18 DIAGNOSIS — L400 Psoriasis vulgaris: Secondary | ICD-10-CM

## 2021-12-18 NOTE — Progress Notes (Signed)
Follow-Up Visit   Subjective  Patrick [name] is a 86 y.o. male who presents for the following: Procedure (Patient here today for [facility] at left neck for bx proven BCC.).  Also still bothered by buttocks. Back and scalp and ears improving but not clear.  The following portions of the chart were reviewed this encounter and updated as appropriate:   Tobacco   Allergies   Meds   Problems   Med Hx   Surg Hx   Fam Hx       Review of Systems:  No other skin or systemic complaints except as noted in HPI or Assessment and Plan.  Objective  Well appearing patient in no apparent distress; mood and affect are within normal limits.  A focused examination was performed including neck, back, buttocks, scalp, ears. Relevant physical exam findings are noted in the Assessment and Plan.  Left Posterior Neck Pink plaque  back, ears, scalp Scaly pink plaques at back, scalp  Gluteal Crease Pink plaques with broad scale symmetrically at each side of buttock and pink patch with central superficial ulceration overlying sacrum    Assessment & Plan  Basal cell carcinoma (BCC) of skin of neck Left Posterior Neck  Destruction of lesion  Destruction method: electrodesiccation and curettage   Informed consent: discussed and consent obtained   Timeout:  patient name, date of birth, surgical site, and procedure verified Patient was prepped and draped in usual sterile fashion: area prepped with isopropyl alcohol. Anesthesia: the lesion was anesthetized in a standard fashion   Anesthetic:  1% lidocaine w/ epinephrine 1-100,000 buffered w/ 8.4% NaHCO3 Curettage performed in three different directions: Yes   Electrodesiccation performed over the curetted area: Yes   Curettage cycles:  3 Final wound size (cm):  1.7 Hemostasis achieved with:  electrodesiccation Outcome: patient tolerated procedure well with no complications   Post-procedure details: wound care instructions given   Additional details:   Mupirocin and a pressure dressing applied  Psoriasis vulgaris back, ears, scalp  Chronic and persistent condition with duration or expected duration over one year. Condition is symptomatic/ bothersome to patient. Not currently at goal.  Improving.   Psoriasis is a chronic non-curable, but treatable genetic/hereditary disease that may have other systemic features affecting other organ systems such as joints (Psoriatic Arthritis). It is associated with an increased risk of inflammatory bowel disease, heart disease, non-alcoholic fatty liver disease, and depression.    Continue betamethasone ointment twice a day as needed up to 2 weeks at back and clobetasol solution twice a day as needed up to 2 weeks to scalp and ears. Avoid applying to face, groin, and axilla. Use as directed. Long-term use can cause thinning of the skin.  D/c betamethasone to buttocks - buttocks clear of psoriasis today  Topical steroids (such as triamcinolone, fluocinolone, fluocinonide, mometasone, clobetasol, halobetasol, betamethasone, hydrocortisone) can cause thinning and lightening of the skin if they are used for too long in the same area. Your physician has selected the right strength medicine for your problem and area affected on the body. Please use your medication only as directed by your physician to prevent side effects.    Pressure injury of buttock, stage 2, unspecified laterality (HCC) Gluteal Crease  D/c betamethasone Start zinc oxide paste 2-3 times per day Recommend donut or waffle pillow and frequent repositioning/standing/walking throughout the day. Stressed importance of keeping pressure off these areas as the only true fix for this.    Return for TBSE, Psoriasis, as scheduled.  Graciella Belton, RMA, am acting as scribe for Forest Gleason, MD .  Documentation: I have reviewed the above documentation for accuracy and completeness, and I agree with the above.  Forest Gleason, MD

## 2021-12-18 NOTE — Patient Instructions (Addendum)
Discontinue betamethasone ointment at buttocks and apply over the counter Desitin or zinc oxide paste 2-3 times daily as well as frequent changing in positions to take pressure off of affected area.   Wound Care Instructions  Cleanse wound gently with soap and water once a day then pat dry with clean gauze. Apply a thing coat of Petrolatum (petroleum jelly, "Vaseline") over the wound (unless you have an allergy to this). We recommend that you use a new, sterile tube of Vaseline. Do not pick or remove scabs. Do not remove the yellow or white "healing tissue" from the base of the wound.  Cover the wound with fresh, clean, nonstick gauze and secure with paper tape. You may use Band-Aids in place of gauze and tape if the would is small enough, but would recommend trimming much of the tape off as there is often too much. Sometimes Band-Aids can irritate the skin.  You should call the office for your biopsy report after 1 week if you have not already been contacted.  If you experience any problems, such as abnormal amounts of bleeding, swelling, significant bruising, significant pain, or evidence of infection, please call the office immediately.  FOR ADULT SURGERY PATIENTS: If you need something for pain relief you may take 1 extra strength Tylenol (acetaminophen) AND 2 Ibuprofen (200mg  each) together every 4 hours as needed for pain. (do not take these if you are allergic to them or if you have a reason you should not take them.) Typically, you may only need pain medication for 1 to 3 days.    If You Need Anything After Your Visit  If you have any questions or concerns for your doctor, please call our main line at [phone] and press option 4 to reach your doctor's medical assistant. If no one answers, please leave a voicemail as directed and we will return your call as soon as possible. Messages left after 4 pm will be answered the following business day.   You may also send [place] a message via  [place]. We typically respond to MyChart messages within 1-2 business days.  For prescription refills, please ask your pharmacy to contact our office. Our fax number is [phone].  If you have an urgent issue when the clinic is closed that cannot wait until the next business day, you can page your doctor at the number below.    Please note that while we do our best to be available for urgent issues outside of office hours, we are not available 24/7.   If you have an urgent issue and are unable to reach [place], you may choose to seek medical care at your doctor's office, retail clinic, urgent care center, or emergency room.  If you have a medical emergency, please immediately call 911 or go to the emergency department.  Pager Numbers  - Dr. Nehemiah Massed: [phone]  - Dr. Laurence Ferrari: [phone]  - Dr. Nicole Kindred: [phone]  In the event of inclement weather, please call our main line at [phone] for an update on the status of any delays or closures.  Dermatology Medication Tips: Please keep the boxes that topical medications come in in order to help keep track of the instructions about where and how to use these. Pharmacies typically print the medication instructions only on the boxes and not directly on the medication tubes.   If your medication is too expensive, please contact our office at [phone] option 4 or send [place] a message through [place].   We are unable to  tell what your co-pay for medications will be in advance as this is different depending on your insurance coverage. However, we may be able to find a substitute medication at lower cost or fill out paperwork to get insurance to cover a needed medication.   If a prior authorization is required to get your medication covered by your insurance company, please allow [place] 1-2 business days to complete this process.  Drug prices often vary depending on where the prescription is filled and some pharmacies may offer cheaper  prices.  The website www.goodrx.com contains coupons for medications through different pharmacies. The prices here do not account for what the cost may be with help from insurance (it may be cheaper with your insurance), but the website can give you the price if you did not use any insurance.  - You can print the associated coupon and take it with your prescription to the pharmacy.  - You may also stop by our office during regular business hours and pick up a GoodRx coupon card.  - If you need your prescription sent electronically to a different pharmacy, notify our office through [facility] or by phone at [phone] option 4.     Si Usted Necesita Algo Despus de Su Visita  Tambin puede enviarnos un mensaje a travs de Pharmacist, community. Por lo general respondemos a los mensajes de MyChart en el transcurso de 1 a 2 das hbiles.  Para renovar recetas, por favor pida a su farmacia que se ponga en contacto con nuestra oficina. Harland Dingwall de fax es [place] [phone].  Si tiene un asunto urgente cuando la clnica est cerrada y que no puede esperar hasta el siguiente da hbil, puede llamar/localizar a su doctor(a) al nmero que aparece a continuacin.   Por favor, tenga en cuenta que aunque hacemos todo lo posible para estar disponibles para asuntos urgentes fuera del horario de [place], no estamos disponibles las 24 horas del da, los 7 das de la [place].   Si tiene un problema urgente y no puede comunicarse con nosotros, puede optar por buscar atencin mdica  en el consultorio de su doctor(a), en una clnica privada, en un centro de atencin urgente o en una sala de emergencias.  Si tiene Engineering geologist, por favor llame inmediatamente al 911 o vaya a la sala de emergencias.  Nmeros de bper  - Dr. Nehemiah Massed: [phone]  - Dra. Moye: [phone]  - Dra. Nicole Kindred: [phone]  En caso de inclemencias del [place], por favor llame a Johnsie Kindred principal al [phone]  para una actualizacin sobre el [place] de cualquier retraso o cierre.  Consejos para la medicacin en dermatologa: Por favor, guarde las cajas en las que vienen los medicamentos de uso tpico para ayudarle a seguir las instrucciones sobre dnde y cmo usarlos. Las farmacias generalmente imprimen las instrucciones del medicamento slo en las cajas y no directamente en los tubos del [place].   Si su medicamento es muy caro, por favor, pngase en contacto con Zigmund Daniel llamando al [phone] y presione la opcin 4 o envenos un mensaje a travs de Pharmacist, community.   No podemos decirle cul ser su copago por los medicamentos por adelantado ya que esto es diferente dependiendo de la cobertura de su seguro. Sin embargo, es posible que podamos encontrar un medicamento sustituto a Electrical engineer un formulario para que el seguro cubra el medicamento que se considera necesario.   Si se requiere una autorizacin previa para que su compaa de seguros [place]  su medicamento, por favor permtanos de 1 a 2 das hbiles para completar este proceso.  Los precios de los medicamentos varan con frecuencia dependiendo del Environmental consultant de dnde se surte la receta y alguna farmacias pueden ofrecer precios ms baratos.  El sitio web www.goodrx.com tiene cupones para medicamentos de Airline pilot. Los precios aqu no tienen en cuenta lo que podra costar con la ayuda del seguro (puede ser ms barato con su seguro), pero el sitio web puede darle el precio si no utiliz Research scientist (physical sciences).  - Puede imprimir el cupn correspondiente y llevarlo con su receta a la farmacia.  - Tambin puede pasar por nuestra oficina durante el horario de atencin regular y Charity fundraiser una tarjeta de cupones de GoodRx.  - Si necesita que su receta se enve electrnicamente a una farmacia diferente, informe a nuestra oficina a travs de MyChart de [place] o por telfono llamando al [phone] y presione la opcin 4.

## 2021-12-31 ENCOUNTER — Other Ambulatory Visit: Payer: Self-pay | Admitting: Internal Medicine

## 2021-12-31 DIAGNOSIS — F39 Unspecified mood [affective] disorder: Secondary | ICD-10-CM

## 2021-12-31 NOTE — Telephone Encounter (Signed)
Refill request Alprazolam ?Last office visit 09/25/21 ?Last refill 11/26/21 #60 ?

## 2022-01-04 ENCOUNTER — Other Ambulatory Visit: Payer: Self-pay | Admitting: Internal Medicine

## 2022-01-04 NOTE — Telephone Encounter (Signed)
Per last lab results: The minor change in kidney function (BUN, creatinine, GFR) can be expected with the new blood pressure medication ?

## 2022-01-22 ENCOUNTER — Other Ambulatory Visit: Payer: Self-pay | Admitting: Family

## 2022-01-22 DIAGNOSIS — F39 Unspecified mood [affective] disorder: Secondary | ICD-10-CM

## 2022-02-27 ENCOUNTER — Other Ambulatory Visit: Payer: Self-pay | Admitting: Internal Medicine

## 2022-02-27 DIAGNOSIS — F39 Unspecified mood [affective] disorder: Secondary | ICD-10-CM

## 2022-02-27 NOTE — Telephone Encounter (Signed)
Last filled 01-22-22 #90 ?Last OV 09-25-21 ?Next OV 03-26-22 ?[place] ?

## 2022-02-28 ENCOUNTER — Other Ambulatory Visit: Payer: Self-pay | Admitting: Internal Medicine

## 2022-02-28 DIAGNOSIS — H401131 Primary open-angle glaucoma, bilateral, mild stage: Secondary | ICD-10-CM | POA: Diagnosis not present

## 2022-03-26 ENCOUNTER — Encounter: Payer: Self-pay | Admitting: Internal Medicine

## 2022-03-26 ENCOUNTER — Ambulatory Visit (INDEPENDENT_AMBULATORY_CARE_PROVIDER_SITE_OTHER): Payer: PPO | Admitting: Internal Medicine

## 2022-03-26 VITALS — BP 122/82 | HR 77 | Temp 96.7°F | Ht 68.0 in | Wt 179.0 lb

## 2022-03-26 DIAGNOSIS — I10 Essential (primary) hypertension: Secondary | ICD-10-CM

## 2022-03-26 DIAGNOSIS — S069X0S Unspecified intracranial injury without loss of consciousness, sequela: Secondary | ICD-10-CM | POA: Diagnosis not present

## 2022-03-26 DIAGNOSIS — N1831 Chronic kidney disease, stage 3a: Secondary | ICD-10-CM

## 2022-03-26 DIAGNOSIS — M25551 Pain in right hip: Secondary | ICD-10-CM | POA: Diagnosis not present

## 2022-03-26 DIAGNOSIS — E1142 Type 2 diabetes mellitus with diabetic polyneuropathy: Secondary | ICD-10-CM | POA: Diagnosis not present

## 2022-03-26 LAB — RENAL FUNCTION PANEL
Albumin: 4.2 g/dL (ref 3.5–5.2)
BUN: 27 mg/dL — ABNORMAL HIGH (ref 6–23)
CO2: 28 mEq/L (ref 19–32)
Calcium: 9.1 mg/dL (ref 8.4–10.5)
Chloride: 98 mEq/L (ref 96–112)
Creatinine, Ser: 1.16 mg/dL (ref 0.40–1.50)
GFR: 58.21 mL/min — ABNORMAL LOW (ref >60.00)
Glucose, Bld: 195 mg/dL — ABNORMAL HIGH (ref 70–99)
Phosphorus: 2.8 mg/dL (ref 2.3–4.6)
Potassium: 4.4 mEq/L (ref 3.5–5.1)
Sodium: 136 mEq/L (ref 135–145)

## 2022-03-26 LAB — POCT GLYCOSYLATED HEMOGLOBIN (HGB A1C): Hemoglobin A1C: 7.4 % — AB (ref 4.0–5.6)

## 2022-03-26 LAB — CBC
HCT: 41.3 % (ref 39.0–52.0)
Hemoglobin: 13.8 g/dL (ref 13.0–17.0)
MCHC: 33.4 g/dL (ref 30.0–36.0)
MCV: 96.3 fl (ref 78.0–100.0)
Platelets: 259 10*3/uL (ref 150.0–400.0)
RBC: 4.29 Mil/uL (ref 4.22–5.81)
RDW: 12.8 % (ref 11.5–15.5)
WBC: 5.2 10*3/uL (ref 4.0–10.5)

## 2022-03-26 NOTE — Assessment & Plan Note (Signed)
Mild cognitive changes  Less vertigo than before---[place]

## 2022-03-26 NOTE — Progress Notes (Signed)
Subjective:    Patient ID: Patrick [name], male    DOB: August 11, 1936, 86 y.o.   MRN: [id number]  HPI Here for follow up of diabetes and other health conditions  Notes some trouble in his right hip---giving out when walking Then stiff when sitting for a while Not severe pain---but noticeable Did mow ditch with push mower before it all started  Sugar "up and down" One low sugar reaction--slight dizziness and reading was 50. Just ate breakfast Uses gabapentin at bedtime--- one or two  Last GFR down to 49----with addition of HCTZ Weight down 10# Some swelling---with persistent standing (better with elevation) No chest pain or SOB Occasional dizziness upon arising---waits briefly and is fine  Current Outpatient Medications on File Prior to Visit  Medication Sig Dispense Refill   ALPRAZolam (XANAX) 1 MG tablet TAKE 1 TABLET BY MOUTH TWICE (2) DAILY AS NEEDED 60 tablet 0   Ascorbic Acid (VITAMIN C) 1000 MG tablet Take 1,000 mg by mouth daily.     aspirin 81 MG tablet Take 81 mg by mouth daily.     betamethasone dipropionate (DIPROLENE) 0.05 % ointment Apply to aa's psoriasis QD-BID PRN. Avoid the face, groin, and axilla. 50 g 2   Blood Glucose Monitoring Suppl (ONE TOUCH ULTRA 2) w/Device KIT SMARTSIG:1 Each Via Meter As Directed     cholecalciferol (VITAMIN D) 1000 units tablet Take 1,000 Units by mouth daily.     clobetasol (TEMOVATE) 0.05 % external solution Apply 1 application topically 2 (two) times daily. Apply to the affected areas of scalp and ears. Used PRN flares for up to two weeks. 50 mL 0   clobetasol ointment (TEMOVATE) 5.85 % Apply 1 application topically 2 (two) times daily. To affected areas of psoriasis PRN flares for up to two weeks. Avoid face, groin and underarms. 60 g 1   desonide (DESOWEN) 0.05 % lotion APPLY TO AFFECTED AREA ON FACE TWICE DAILY 118 mL 0   fish oil-omega-3 fatty acids 1000 MG capsule Take 2 g by mouth daily.     gabapentin (NEURONTIN) 100 MG  capsule TAKE 1 OR 2 CAPSULES BY MOUTH AT BEDTIME 60 capsule 11   glimepiride (AMARYL) 4 MG tablet TAKE 1 TABLET BY MOUTH TWICE A DAY FOR DIABETES 180 tablet 3   hydrochlorothiazide (HYDRODIURIL) 25 MG tablet Take 1 tablet (25 mg total) by mouth daily. 90 tablet 3   hydrocortisone 2.5 % cream Apply topically 2 (two) times daily as needed (Rash). 30 g 2   ketoconazole (NIZORAL) 2 % cream Apply twice daily as needed 60 g 1   ketoconazole (NIZORAL) 2 % shampoo Apply to scalp 3 times per week. Leave in for a for minutes before rinsing. 120 mL 0   latanoprost (XALATAN) 0.005 % ophthalmic solution Place 1 drop into both eyes Daily.     lisinopril (ZESTRIL) 40 MG tablet TAKE 1 TABLET BY MOUTH EVERY MORNING FORBLOOD PRESSURE 90 tablet 3   metFORMIN (GLUCOPHAGE) 1000 MG tablet TAKE 1 TABLETS BY MOUTH BEFORE BREAKFASTAND THEN 1 TABLET BEFORE DINNER AND 1/2 TABLET AT BEDTIME FOR DIABETES. 235 tablet 3   ONETOUCH DELICA LANCETS 92T MISC 1 Units by Does not apply route as needed. Use one to obtain blood sugar. Dx  E11.49 100 each 3   ONETOUCH ULTRA test strip USE TO CHECK BLOOD SUGAR TWICE DAILY DX E11.49 100 each 3   vitamin E 400 UNIT capsule Take 400 Units by mouth daily.     mupirocin ointment (BACTROBAN)  2 % Apply 1 application topically 3 (three) times daily. Apply to open areas buttocks crease as directed 22 g 2   No current facility-administered medications on file prior to visit.    No Known Allergies  Past Medical History:  Diagnosis Date   Anxiety    BCC (basal cell carcinoma) 11/27/2021   left posterior neck, EDC 12/18/2021   Diabetes mellitus    GERD (gastroesophageal reflux disease)    Hyperlipidemia    Hypertension    Irregular heartbeat    Dr. (his) stated that it was "nothing to worry about."   Peptic ulcer disease    Renal cyst    Rosacea     Past Surgical History:  Procedure Laterality Date   CHOLECYSTECTOMY     KNEE ARTHROSCOPY     left   PARTIAL GASTRECTOMY  1974     Family History  Problem Relation Age of Onset   Cancer Brother        prostate   Colon cancer Brother    Kidney failure Brother    Cancer Sister    Heart disease Brother    Cancer Brother        prostate cancer   Cancer Brother        prostate    Social History   Socioeconomic History   Marital status: Widowed    Spouse name: Not on file   Number of children: 2   Years of education: Not on file   Highest education level: Not on file  Occupational History   Occupation: RETIRED, Programmer, systems: RETIRED  Tobacco Use   Smoking status: Never    Passive exposure: Never   Smokeless tobacco: Never  Substance and Sexual Activity   Alcohol use: No   Drug use: Not on file   Sexual activity: Not on file  Other Topics Concern   Not on file  Social History Narrative   Now has living will   Son is health care POA--no alternate   Would accept resuscitation attempts   No tube feeds if cognitively unaware   Social Determinants of Health   Financial Resource Strain: Not on file  Food Insecurity: Not on file  Transportation Needs: Not on file  Physical Activity: Not on file  Stress: Not on file  Social Connections: Not on file  Intimate Partner Violence: Not on file   Review of Systems Appetite is good Sleeps well--with the gabapentin     Objective:   Physical Exam Constitutional:      Appearance: Normal appearance.  Cardiovascular:     Rate and Rhythm: Normal rate and regular rhythm.     Heart sounds: No murmur heard.   No gallop.     Comments: Faint pedal pulses Pulmonary:     Effort: Pulmonary effort is normal.     Breath sounds: Normal breath sounds. No wheezing or rales.  Musculoskeletal:     Cervical back: Neck supple.     Comments: Puffiness in feet--no true edema Mild tenderness at upper lateral right hip Mildly decreased internal rotation of hips  Lymphadenopathy:     Cervical: No cervical adenopathy.  Skin:    Comments: No foot lesions   Neurological:     Mental Status: He is alert.  Psychiatric:        Mood and Affect: Mood normal.        Behavior: Behavior normal.           Assessment & Plan:

## 2022-03-26 NOTE — Patient Instructions (Signed)
Please try over the counter diclofenac gel up to three times a day for your hip pain.

## 2022-03-26 NOTE — Assessment & Plan Note (Signed)
Is on lisinopril Will recheck today

## 2022-03-26 NOTE — Assessment & Plan Note (Signed)
?  mild bursitis vs early OA Advised trying OTC diclofenac cream

## 2022-03-26 NOTE — Assessment & Plan Note (Signed)
Lab Results  Component Value Date   HGBA1C 7.4 (A) 03/26/2022   Control better now On metformin1000/1000/500 and glimepiride 4 bid Uses gabapentin 100-200 at bedtime for neuropathy

## 2022-03-26 NOTE — Assessment & Plan Note (Signed)
BP Readings from Last 3 Encounters:  03/26/22 122/82  09/25/21 138/82  03/20/21 138/76   Good control Less edema off verapamil-----lisinopril 40/HCTZ 25

## 2022-03-28 ENCOUNTER — Other Ambulatory Visit: Payer: Self-pay | Admitting: Internal Medicine

## 2022-03-28 DIAGNOSIS — F39 Unspecified mood [affective] disorder: Secondary | ICD-10-CM

## 2022-03-28 NOTE — Telephone Encounter (Signed)
Last filled 02-27-22 #60 Last OV 03-26-22 Next OV 09-27-22 [place]

## 2022-04-05 ENCOUNTER — Other Ambulatory Visit: Payer: Self-pay | Admitting: Internal Medicine

## 2022-04-05 NOTE — Telephone Encounter (Signed)
Last office visit 03/26/22 for DM, HTN, Kidney Disease, Hip Pain.  Last refilled 03/21/21 for #60 with 11 refills.  Next appt: 09/27/22 for CPE.

## 2022-05-03 ENCOUNTER — Other Ambulatory Visit: Payer: Self-pay | Admitting: Internal Medicine

## 2022-05-03 DIAGNOSIS — F39 Unspecified mood [affective] disorder: Secondary | ICD-10-CM

## 2022-05-03 NOTE — Telephone Encounter (Signed)
Last filled 03-28-22 #60 Last OV 03-26-22 Next OV 09-27-22 [place]

## 2022-05-28 ENCOUNTER — Other Ambulatory Visit: Payer: Self-pay | Admitting: Internal Medicine

## 2022-05-28 DIAGNOSIS — F39 Unspecified mood [affective] disorder: Secondary | ICD-10-CM

## 2022-05-28 NOTE — Telephone Encounter (Signed)
Last filled 05-03-22 #60 Last OV 03-26-22 Next OV 09-27-22 [place]

## 2022-06-11 ENCOUNTER — Other Ambulatory Visit: Payer: Self-pay | Admitting: Internal Medicine

## 2022-06-17 ENCOUNTER — Other Ambulatory Visit: Payer: Self-pay | Admitting: Dermatology

## 2022-06-26 ENCOUNTER — Encounter: Payer: Self-pay | Admitting: Dermatology

## 2022-06-26 ENCOUNTER — Other Ambulatory Visit: Payer: Self-pay

## 2022-06-26 ENCOUNTER — Ambulatory Visit: Payer: PPO | Admitting: Dermatology

## 2022-06-26 DIAGNOSIS — C44629 Squamous cell carcinoma of skin of left upper limb, including shoulder: Secondary | ICD-10-CM

## 2022-06-26 DIAGNOSIS — Z85828 Personal history of other malignant neoplasm of skin: Secondary | ICD-10-CM | POA: Diagnosis not present

## 2022-06-26 DIAGNOSIS — L219 Seborrheic dermatitis, unspecified: Secondary | ICD-10-CM | POA: Diagnosis not present

## 2022-06-26 DIAGNOSIS — D099 Carcinoma in situ, unspecified: Secondary | ICD-10-CM

## 2022-06-26 DIAGNOSIS — L821 Other seborrheic keratosis: Secondary | ICD-10-CM | POA: Diagnosis not present

## 2022-06-26 DIAGNOSIS — L89302 Pressure ulcer of unspecified buttock, stage 2: Secondary | ICD-10-CM

## 2022-06-26 DIAGNOSIS — L98491 Non-pressure chronic ulcer of skin of other sites limited to breakdown of skin: Secondary | ICD-10-CM | POA: Diagnosis not present

## 2022-06-26 DIAGNOSIS — L814 Other melanin hyperpigmentation: Secondary | ICD-10-CM | POA: Diagnosis not present

## 2022-06-26 DIAGNOSIS — D1800 Hemangioma unspecified site: Secondary | ICD-10-CM | POA: Diagnosis not present

## 2022-06-26 DIAGNOSIS — D229 Melanocytic nevi, unspecified: Secondary | ICD-10-CM | POA: Diagnosis not present

## 2022-06-26 DIAGNOSIS — L570 Actinic keratosis: Secondary | ICD-10-CM

## 2022-06-26 DIAGNOSIS — L578 Other skin changes due to chronic exposure to nonionizing radiation: Secondary | ICD-10-CM | POA: Diagnosis not present

## 2022-06-26 DIAGNOSIS — L400 Psoriasis vulgaris: Secondary | ICD-10-CM

## 2022-06-26 DIAGNOSIS — Z1283 Encounter for screening for malignant neoplasm of skin: Secondary | ICD-10-CM

## 2022-06-26 DIAGNOSIS — D489 Neoplasm of uncertain behavior, unspecified: Secondary | ICD-10-CM | POA: Diagnosis not present

## 2022-06-26 HISTORY — DX: Carcinoma in situ, unspecified: D09.9

## 2022-06-26 MED ORDER — ALLEVYN LIFE SACRUM EX PADS: MEDICATED_PAD | CUTANEOUS | 1 refills | Status: DC

## 2022-06-26 MED ORDER — KETOCONAZOLE 2 % EX SHAM: MEDICATED_SHAMPOO | CUTANEOUS | 11 refills | Status: AC

## 2022-06-26 MED ORDER — MUPIROCIN 2 % EX OINT: 1.0000 | TOPICAL_OINTMENT | Freq: Three times a day (TID) | CUTANEOUS | 2 refills | Status: DC

## 2022-06-26 NOTE — Patient Instructions (Addendum)
For Sacral Wound  Wash area daily with soap and water, pat dry. Apply thin layer of Mupirocin Ointment 2 % daily and cover with prescribed bandage.   D/c bethamethasone ointment  Start Ketoconazole shampoo - apply three times per week, massage into scalp and leave in for 10 minutes before rinsing out     Biopsy Wound Care Instructions  Leave the original bandage on for 24 hours if possible.  If the bandage becomes soaked or soiled before that time, it is OK to remove it and examine the wound.  A small amount of post-operative bleeding is normal.  If excessive bleeding occurs, remove the bandage, place gauze over the site and apply continuous pressure (no peeking) over the area for 30 minutes. If this does not work, please call our clinic as soon as possible or page your doctor if it is after hours.   Once a day, cleanse the wound with soap and water. It is fine to shower. If a thick crust develops you may use a Q-tip dipped into dilute hydrogen peroxide (mix 1:1 with water) to dissolve it.  Hydrogen peroxide can slow the healing process, so use it only as needed.    After washing, apply petroleum jelly (Vaseline) or an antibiotic ointment if your doctor prescribed one for you, followed by a bandage.    For best healing, the wound should be covered with a layer of ointment at all times. If you are not able to keep the area covered with a bandage to hold the ointment in place, this may mean re-applying the ointment several times a day.  Continue this wound care until the wound has healed and is no longer open.   Itching and mild discomfort is normal during the healing process. However, if you develop pain or severe itching, please call our office.   If you have any discomfort, you can take Tylenol (acetaminophen) or ibuprofen as directed on the bottle. (Please do not take these if you have an allergy to them or cannot take them for another reason).  Some redness, tenderness and white or  yellow material in the wound is normal healing.  If the area becomes very sore and red, or develops a thick yellow-green material (pus), it may be infected; please notify us.    If you have stitches, return to clinic as directed to have the stitches removed. You will continue wound care for 2-3 days after the stitches are removed.   Wound healing continues for up to one year following surgery. It is not unusual to experience pain in the scar from time to time during the interval.  If the pain becomes severe or the scar thickens, you should notify the office.    A slight amount of redness in a scar is expected for the first six months.  After six months, the redness will fade and the scar will soften and fade.  The color difference becomes less noticeable with time.  If there are any problems, return for a post-op surgery check at your earliest convenience.  To improve the appearance of the scar, you can use silicone scar gel, cream, or sheets (such as Mederma or Serica) every night for up to one year. These are available over the counter (without a prescription).  Please call our office at [phone] for any questions or concerns.       Melanoma ABCDEs  Melanoma is the most dangerous type of skin cancer, and is the leading cause of death from skin  disease.  You are more likely to develop melanoma if you: Have light-colored skin, light-colored eyes, or red or blond hair Spend a lot of time in the sun Tan regularly, either outdoors or in a tanning bed Have had blistering sunburns, especially during childhood Have a close family member who has had a melanoma Have atypical moles or large birthmarks  Early detection of melanoma is key since treatment is typically straightforward and cure rates are extremely high if we catch it early.   The first sign of melanoma is often a change in a mole or a new dark spot.  The ABCDE system is a way of remembering the signs of melanoma.  A for  asymmetry:  The two halves do not match. B for border:  The edges of the growth are irregular. C for color:  A mixture of colors are present instead of an even brown color. D for diameter:  Melanomas are usually (but not always) greater than 78m - the size of a pencil eraser. E for evolution:  The spot keeps changing in size, shape, and color.  Please check your skin once per month between visits. You can use a small mirror in front and a large mirror behind you to keep an eye on the back side or your body.   If you see any new or changing lesions before your next follow-up, please call to schedule a visit.  Please continue daily skin protection including broad spectrum sunscreen SPF 30+ to sun-exposed areas, reapplying every 2 hours as needed when you're outdoors.   Staying in the shade or wearing long sleeves, sun glasses (UVA+UVB protection) and wide brim hats (4-inch brim around the entire circumference of the hat) are also recommended for sun protection.    Due to recent changes in healthcare laws, you may see results of your pathology and/or laboratory studies on MyChart before the doctors have had a chance to review them. We understand that in some cases there may be results that are confusing or concerning to you. Please understand that not all results are received at the same time and often the doctors may need to interpret multiple results in order to provide you with the best plan of care or course of treatment. Therefore, we ask that you please give u[place]2 business days to thoroughly review all your results before contacting the office for clarification. Should we see a critical lab result, you will be contacted sooner.   If You Need Anything After Your Visit  If you have any questions or concerns for your doctor, please call our main line at [phone] press option 4 to reach your doctor's medical assistant. If no one answers, please leave a voicemail as directed and we will  return your call as soon as possible. Messages left after 4 pm will be answered the following business day.   You may also send [place] message via [facility] We typically respond to MyChart messages within 1-2 business days.  For prescription refills, please ask your pharmacy to contact our office. Our fax number is [phone]  If you have an urgent issue when the clinic is closed that cannot wait until the next business day, you can page your doctor at the number below.    Please note that while we do our best to be available for urgent issues outside of office hours, we are not available 24/7.   If you have an urgent issue and are unable to reach u[place] you may  choose to seek medical care at your doctor's office, retail clinic, urgent care center, or emergency room.  If you have a medical emergency, please immediately call 911 or go to the emergency department.  Pager Numbers  - Dr. Nehemiah Massed: [phone]  - Dr. Laurence Ferrari: [phone]  - Dr. Nicole Kindred: [phone]  In the event of inclement weather, please call our main line at [phone] for an update on the status of any delays or closures.  Dermatology Medication Tips: Please keep the boxes that topical medications come in in order to help keep track of the instructions about where and how to use these. Pharmacies typically print the medication instructions only on the boxes and not directly on the medication tubes.   If your medication is too expensive, please contact our office at [phone] option 4 or send [place] a message through [place].   We are unable to tell what your co-pay for medications will be in advance as this is different depending on your insurance coverage. However, we may be able to find a substitute medication at lower cost or fill out paperwork to get insurance to cover a needed medication.   If a prior authorization is required to get your medication covered by your insurance company, please allow [place] 1-2 business  days to complete this process.  Drug prices often vary depending on where the prescription is filled and some pharmacies may offer cheaper prices.  The website www.goodrx.com contains coupons for medications through different pharmacies. The prices here do not account for what the cost may be with help from insurance (it may be cheaper with your insurance), but the website can give you the price if you did not use any insurance.  - You can print the associated coupon and take it with your prescription to the pharmacy.  - You may also stop by our office during regular business hours and pick up a GoodRx coupon card.  - If you need your prescription sent electronically to a different pharmacy, notify our office through [facility] or by phone at [phone] option 4.     Si Usted Necesita Algo Despus de Su Visita  Tambin puede enviarnos un mensaje a travs de Pharmacist, community. Por lo general respondemos a los mensajes de MyChart en el transcurso de 1 a 2 das hbiles.  Para renovar recetas, por favor pida a su farmacia que se ponga en contacto con nuestra oficina. Harland Dingwall de fax es [place] [phone].  Si tiene un asunto urgente cuando la clnica est cerrada y que no puede esperar hasta el siguiente da hbil, puede llamar/localizar a su doctor(a) al nmero que aparece a continuacin.   Por favor, tenga en cuenta que aunque hacemos todo lo posible para estar disponibles para asuntos urgentes fuera del horario de [place], no estamos disponibles las 24 horas del da, los 7 das de la [place].   Si tiene un problema urgente y no puede comunicarse con nosotros, puede optar por buscar atencin mdica  en el consultorio de su doctor(a), en una clnica privada, en un centro de atencin urgente o en una sala de emergencias.  Si tiene Engineering geologist, por favor llame inmediatamente al 911 o vaya a la sala de emergencias.  Nmeros de bper  - Dr. Nehemiah Massed: [phone]  - Dra. Moye:  [phone]  - Dra. Nicole Kindred: [phone]  En caso de inclemencias del [place], por favor llame a Johnsie Kindred principal al [phone] para una actualizacin sobre el [place] de cualquier retraso o cierre.  Consejos para la medicacin en dermatologa: Por favor, guarde las cajas en las que vienen los medicamentos de uso tpico para ayudarle a seguir las instrucciones sobre dnde y cmo usarlos. Las farmacias generalmente imprimen las instrucciones del medicamento slo en las cajas y no directamente en los tubos del [place].   Si su medicamento es muy caro, por favor, pngase en contacto con Zigmund Daniel llamando al [phone] y presione la opcin 4 o envenos un mensaje a travs de Pharmacist, community.   No podemos decirle cul ser su copago por los medicamentos por adelantado ya que esto es diferente dependiendo de la cobertura de su seguro. Sin embargo, es posible que podamos encontrar un medicamento sustituto a Electrical engineer un formulario para que el seguro cubra el medicamento que se considera necesario.   Si se requiere una autorizacin previa para que su compaa de seguros [place] su medicamento, por favor permtanos de 1 a 2 das hbiles para completar este proceso.  Los precios de los medicamentos varan con frecuencia dependiendo del Environmental consultant de dnde se surte la receta y alguna farmacias pueden ofrecer precios ms baratos.  El sitio web www.goodrx.com tiene cupones para medicamentos de Airline pilot. Los precios aqu no tienen en cuenta lo que podra costar con la ayuda del seguro (puede ser ms barato con su seguro), pero el sitio web puede darle el precio si no utiliz Research scientist (physical sciences).  - Puede imprimir el cupn correspondiente y llevarlo con su receta a la farmacia.  - Tambin puede pasar por nuestra oficina durante el horario de atencin regular y Charity fundraiser una tarjeta de cupones de GoodRx.  - Si necesita que su receta se enve electrnicamente a una farmacia diferente,  informe a nuestra oficina a travs de MyChart de [place] o por telfono llamando al [phone] y presione la opcin 4.

## 2022-06-26 NOTE — Progress Notes (Signed)
Referral placed for Stage II pressure ulcer of gluteal crease to [place]. JP

## 2022-06-26 NOTE — Progress Notes (Signed)
Follow-Up Visit   Subjective  Patrick [name] is a 86 y.o. male who presents for the following: Annual Exam (6 mnth psoriasis follow up, tbse, hx of bcc, psoriasis , pressure ulcer at buttocks has not resolved. Continuing to use prescribed ointment. Non healing spot at left forearm. ).  The patient presents for Total-Body Skin Exam (TBSE) for skin cancer screening and mole check.  The patient has spots, moles and lesions to be evaluated, some may be new or changing and the patient has concerns that these could be cancer.  The following portions of the chart were reviewed this encounter and updated as appropriate:  Tobacco  Allergies  Meds  Problems  Med Hx  Surg Hx  Fam Hx      Review of Systems: No other skin or systemic complaints except as noted in HPI or Assessment and Plan.   Objective  Well appearing patient in no apparent distress; mood and affect are within normal limits.  A full examination was performed including scalp, head, eyes, ears, nose, lips, neck, chest, axillae, abdomen, back, buttocks, bilateral upper extremities, bilateral lower extremities, hands, feet, fingers, toes, fingernails, and toenails. All findings within normal limits unless otherwise noted below.  Gluteal Crease Ulcer with surrounding fixed erythema  back, scalp, ears Clear at exam   Scalp Pink patches with greasy scale.   right forearm x 1, left vertex x 2, right nasal supertip x 1 (4) Erythematous thin papules/macules with gritty scale.   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 Patient advised this inhibits healing and causes atrophy of the skin.  Stressed need to avoid pressure to the area.  Start mupirocin and sacral pad to area daily for 7 days. Wash with soap and water before placing mupirocin and pad.    After 1 week, d/c mupirocin and just use sacral pad.   Will refer to wound care clinic   Wound Dressings (ALLEVYN LIFE SACRUM) PADS - Gluteal Crease Apply mupirocin ointment to ulcer daily for 7 days, then apply pad no top. After 7 days, apply pad only.  mupirocin ointment (BACTROBAN) 2 % - Gluteal Crease Apply 1 Application topically 3 (three) times daily. Apply to open areas buttocks crease as directed  Psoriasis vulgaris back, scalp, ears  Clear. Observe for recurrence.   D/c betamethasone   Denies any joint pain.   Patient advised if rash comes back to let us know   Psoriasis is a chronic non-curable, but treatable genetic/hereditary disease that may have other systemic features affecting other organ systems such as joints (Psoriatic Arthritis). It is associated with an increased risk of inflammatory bowel disease, heart disease, non-alcoholic fatty liver disease, and depression.      Seborrheic dermatitis Scalp  Chronic and persistent condition with duration or expected duration over one year. Condition is bothersome/symptomatic for patient. Currently flared.  Seborrheic Dermatitis  -  is a chronic persistent rash characterized by pinkness and scaling most commonly of the mid face but also can occur on the scalp (dandruff), ears; mid chest, mid back and groin.  It tends to be exacerbated by stress and cooler weather.  People who have neurologic disease may experience new onset or exacerbation of existing seborrheic dermatitis.  The condition is not curable but treatable and can be controlled.  Start  Ketoconazole Shampoo 2 % - apply three times per week, massage into scalp and leave in for 10 minutes before rinsing out.      ketoconazole (NIZORAL) 2 % shampoo - Scalp apply three times per week, massage into scalp and leave in for 10 minutes before rinsing out  Actinic keratosis (4) right forearm x 1, left vertex x 2, right nasal supertip x 1  Actinic keratoses are  precancerous spots that appear secondary to cumulative UV radiation exposure/sun exposure over time. They are chronic with expected duration over 1 year. A portion of actinic keratoses will progress to squamous cell carcinoma of the skin. It is not possible to reliably predict which spots will progress to skin cancer and so treatment is recommended to prevent development of skin cancer.  Recommend daily broad spectrum sunscreen SPF 30+ to sun-exposed areas, reapply every 2 hours as needed.  Recommend staying in the shade or wearing long sleeves, sun glasses (UVA+UVB protection) and wide brim hats (4-inch brim around the entire circumference of the hat). Call for new or changing lesions.  Destruction of lesion - right forearm x 1, left vertex x 2, right nasal supertip x 1  Destruction method: cryotherapy   Informed consent: discussed and consent obtained   Lesion destroyed using liquid nitrogen: Yes   Cryotherapy cycles:  2 Outcome: patient tolerated procedure well with no complications   Post-procedure details: wound care instructions given   Additional details:  Prior to procedure, discussed risks of blister formation, small wound, skin dyspigmentation, or rare scar following cryotherapy. Recommend Vaseline ointment to treated areas while healing.   Neoplasm of uncertain behavior left elbow  Skin / nail biopsy Type of biopsy: tangential   Informed consent: discussed and consent obtained   Timeout: patient name, date of birth, surgical site, and procedure verified   Patient was prepped and draped in usual sterile fashion: Area prepped with isopropyl alcohol. Anesthesia: the lesion was anesthetized in a standard fashion   Anesthetic:  1% lidocaine w/ epinephrine 1-100,000 buffered w/ 8.4% NaHCO3 Instrument used: flexible razor blade   Hemostasis achieved with: aluminum chloride   Outcome: patient tolerated procedure well   Post-procedure details: wound care instructions given    Additional details:  Mupirocin and a bandage applied  Anatomic Pathology Report  R/o SCCIS  Patient request [facility]   Lentigines - Scattered tan macules - Due to sun exposure - Benign-appearing, observe - Recommend daily broad spectrum sunscreen SPF 30+ to sun-exposed areas, reapply every 2 hours as needed. - Call for any changes  Seborrheic Keratoses - Stuck-on, waxy, tan-brown papules and/or plaques  - Benign-appearing - Discussed benign etiology and prognosis. - Observe - Call for any changes  Melanocytic Nevi - Tan-brown and/or pink-flesh-colored symmetric macules and papules - Benign appearing on exam today - Observation - Call clinic for new or changing moles - Recommend daily use of broad spectrum spf 30+ sunscreen to sun-exposed areas.   Hemangiomas - Red papules - Discussed benign nature - Observe - Call for any changes  Actinic Damage - Chronic condition, secondary to cumulative UV/sun exposure - diffuse scaly erythematous macules with underlying dyspigmentation - Recommend daily broad spectrum sunscreen SPF 30+ to sun-exposed areas, reapply every 2 hours as needed.  - Staying in the shade or wearing long sleeves, sun glasses (UVA+UVB protection) and wide brim hats (4-inch brim around the entire circumference of the hat) are also recommended for sun protection.  - Call for new or changing lesions.  History of Basal  Cell Carcinoma of the Skin  left posterior neck ED&C 12/18/21 - No evidence of recurrence today - Recommend regular full body skin exams - Recommend daily broad spectrum sunscreen SPF 30+ to sun-exposed areas, reapply every 2 hours as needed.  - Call if any new or changing lesions are noted between office visits  Skin cancer screening performed today. Return in about 6 weeks (around 08/07/2022) for recheck sacral wound , 6 month tbse . I, Ruthell Rummage, CMA, am acting as scribe for Forest Gleason, MD.   Documentation: I have reviewed the  above documentation for accuracy and completeness, and I agree with the above.  Forest Gleason, MD

## 2022-07-03 ENCOUNTER — Other Ambulatory Visit: Payer: Self-pay | Admitting: Internal Medicine

## 2022-07-03 ENCOUNTER — Telehealth: Payer: Self-pay

## 2022-07-03 DIAGNOSIS — F39 Unspecified mood [affective] disorder: Secondary | ICD-10-CM

## 2022-07-03 LAB — ANATOMIC PATHOLOGY REPORT

## 2022-07-03 NOTE — Telephone Encounter (Signed)
Last filled 05-28-22 #60 Last OV 03-26-22 Next OV 09-27-22 [place]

## 2022-07-03 NOTE — Telephone Encounter (Addendum)
  Called and discussed bx results with patient. Patient verbalized understanding scheduled patient after his wound care appointment. Patient was instructed to call if he notices any thing changing at left elbow.  ----- Message from Alfonso Patten, MD sent at 07/03/2022 10:35 AM EDT ----- Specimen A-SQUAMOUS CELL CARCINOMA IN SITU. RADIAL MARGIN  INVOLVED. --> ED&C  MAs please call with results and schedule. Thank you!

## 2022-07-24 ENCOUNTER — Other Ambulatory Visit: Payer: Self-pay | Admitting: Internal Medicine

## 2022-07-24 DIAGNOSIS — F39 Unspecified mood [affective] disorder: Secondary | ICD-10-CM

## 2022-07-30 ENCOUNTER — Other Ambulatory Visit: Payer: Self-pay | Admitting: Internal Medicine

## 2022-07-30 DIAGNOSIS — F39 Unspecified mood [affective] disorder: Secondary | ICD-10-CM

## 2022-07-30 NOTE — Telephone Encounter (Signed)
Last filled 07-03-22 #60 Last OV 03-26-22 Next OV 09-27-22 [place]

## 2022-08-13 ENCOUNTER — Encounter: Payer: PPO | Attending: Physician Assistant | Admitting: Physician Assistant

## 2022-08-13 DIAGNOSIS — I10 Essential (primary) hypertension: Secondary | ICD-10-CM | POA: Insufficient documentation

## 2022-08-13 DIAGNOSIS — L03317 Cellulitis of buttock: Secondary | ICD-10-CM | POA: Diagnosis not present

## 2022-08-13 DIAGNOSIS — Z09 Encounter for follow-up examination after completed treatment for conditions other than malignant neoplasm: Secondary | ICD-10-CM | POA: Diagnosis not present

## 2022-08-13 DIAGNOSIS — L988 Other specified disorders of the skin and subcutaneous tissue: Secondary | ICD-10-CM | POA: Insufficient documentation

## 2022-08-13 DIAGNOSIS — E11622 Type 2 diabetes mellitus with other skin ulcer: Secondary | ICD-10-CM | POA: Diagnosis not present

## 2022-08-14 ENCOUNTER — Ambulatory Visit: Payer: PPO | Admitting: Dermatology

## 2022-08-14 DIAGNOSIS — L89151 Pressure ulcer of sacral region, stage 1: Secondary | ICD-10-CM

## 2022-08-14 DIAGNOSIS — D099 Carcinoma in situ, unspecified: Secondary | ICD-10-CM

## 2022-08-14 DIAGNOSIS — D0462 Carcinoma in situ of skin of left upper limb, including shoulder: Secondary | ICD-10-CM | POA: Diagnosis not present

## 2022-08-14 NOTE — Progress Notes (Signed)
ACEA, YAGI ([id number]) [phone] [email or web address] Page 1 of 5 Visit Report for 08/13/2022 Abuse Risk Screen Details Patient Name: Date of Service: Patrick [name], Patrick [name] LMA DGE Patrick [name]. 08/13/2022 8:45 A M Medical Record Number: [id number] Patient Account Number: [id number] Date of Birth/Sex: Treating RN: 24-Aug-1936 (85 y.o. [name]) Carlene Coria Primary Care Vayden Weinand: Viviana Simpler Other Clinician: Referring Visente Kirker: Treating Dilan Novosad/Extender: Helayne [place], [place] Weeks in Treatment: 0 Abuse Risk Screen Items Answer ABUSE RISK SCREEN: Has anyone close to you tried to hurt or harm you recentlyo No Do you feel uncomfortable with anyone in your familyo No Has anyone forced you do things that you didnt want to doo No Electronic Signature(s) Signed: 08/14/2022 8:53:50 AM By: Carlene Coria RN Entered By: Carlene Coria on 08/13/2022 08:51:51 -------------------------------------------------------------------------------- Activities of Daily Living Details Patient Name: Date of Service: Patrick [name], Patrick [name] LMA DGE Patrick [name]. 08/13/2022 8:45 A M Medical Record Number: [id number] Patient Account Number: [id number] Date of Birth/Sex: Treating RN: 1936-05-30 (85 y.o. Oval Linsey Primary Care Silvina Hackleman: Viviana Simpler Other Clinician: Referring Letisia Schwalb: Treating Ashlynd Michna/Extender: Helayne [place], [place] in Treatment: 0 Activities of Daily Living Items Answer Activities of Daily Living (Please select one for each item) Drive Automobile Completely Able T Medications ake Completely Able Use T elephone Completely Able Care for Appearance Completely Able Use T oilet Completely Able Bath / Shower Completely Able Dress Self Completely Able Feed Self Completely Able Walk Completely Able Get In / Out Bed Completely Able Housework Completely TAB, RYLEE ([id number]) [phone] [email or web address] Page 2 of 5 Prepare Meals Completely Able Handle Money  Completely Able Shop for Self Completely Able Electronic Signature(s) Signed: 08/14/2022 8:53:50 AM By: Carlene Coria RN Entered By: Carlene Coria on 08/13/2022 08:52:12 -------------------------------------------------------------------------------- Education Screening Details Patient Name: Date of Service: Patrick [name] LMA DGE Patrick [name]. 08/13/2022 8:45 A M Medical Record Number: [id number] Patient Account Number: [id number] Date of Birth/Sex: Treating RN: 21-Jan-1936 (85 y.o. [name]) Carlene Coria Primary Care Sriyan Cutting: Viviana Simpler Other Clinician: Referring Johnthan Axtman: Treating Lenward Able/Extender: Helayne [place], [place] in Treatment: 0 Primary Learner Assessed: Patient Learning Preferences/Education Level/Primary Language Learning Preference: Explanation Highest Education Level: College or Above Preferred Language: English Cognitive Barrier Language Barrier: No Translator Needed: No Memory Deficit: No Emotional Barrier: No Cultural/Religious Beliefs Affecting Medical Care: No Physical Barrier Impaired Vision: No Impaired Hearing: No Decreased Hand dexterity: No Knowledge/Comprehension Knowledge Level: Medium Comprehension Level: High Ability to understand written instructions: High Ability to understand verbal instructions: High Motivation Anxiety Level: Calm Cooperation: Cooperative Education Importance: Acknowledges Need Interest in Health Problems: Asks Questions Perception: Coherent Willingness to Engage in Self-Management High Activities: Readiness to Engage in Self-Management High Activities: Electronic Signature(s) Signed: 08/14/2022 8:53:50 AM By: Carlene Coria RN Entered By: Carlene Coria on 08/13/2022 08:52:43 [name] ([id number]) [id number] [id number] Page 3 of 5 -------------------------------------------------------------------------------- Fall Risk Assessment Details Patient Name: Date of Service: Patrick [name], Patrick [name] LMA DGE Patrick [name].  08/13/2022 8:45 A M Medical Record Number: [id number] Patient Account Number: [id number] Date of Birth/Sex: Treating RN: 01-Sep-1936 (85 y.o. [name]) Carlene Coria Primary Care Kylin Dubs: Viviana Simpler Other Clinician: Referring Linville Decarolis: Treating Felishia Wartman/Extender: Helayne [place], [place] Weeks in Treatment: 0 Fall Risk Assessment Items Have you had 2 or more falls in the last 12 monthso 0 No Have you had any fall that resulted in injury in the last 12 monthso 0 No FALLS RISK SCREEN History of falling - immediate or within 3 months 0 No Secondary diagnosis (Do you have 2 or more medical diagnoseso) 0 No  Ambulatory aid None/bed rest/wheelchair/nurse 0 No Crutches/cane/walker 0 No Furniture 0 No Intravenous therapy Access/Saline/Heparin Lock 0 No Gait/Transferring Normal/ bed rest/ wheelchair 0 No Weak (short steps with or without shuffle, stooped but able to lift head while walking, may seek 0 No support from furniture) Impaired (short steps with shuffle, may have difficulty arising from chair, head down, impaired 0 No balance) Mental Status Oriented to own ability 0 No Electronic Signature(s) Signed: 08/14/2022 8:53:50 AM By: Carlene Coria RN Entered By: Carlene Coria on 08/13/2022 08:52:54 -------------------------------------------------------------------------------- Foot Assessment Details Patient Name: Date of Service: Patrick [name] LMA DGE Patrick [name]. 08/13/2022 8:45 A M Medical Record Number: [id number] Patient Account Number: [id number] Date of Birth/Sex: Treating RN: 1936-03-30 (85 y.o. [name]) Carlene Coria Primary Care Benicio Manna: Viviana Simpler Other Clinician: Referring Nuno Brubacher: Treating Harkirat Orozco/Extender: Helayne [place], [place] in Treatment: 0 Foot Assessment Items Site Locations [place], [place] Patrick [name] ([id number]) [phone] [id number] Page 4 of 5 + = Sensation present, - = Sensation absent, C = Callus, U = Ulcer R = Redness, W = Warmth, M =  Maceration, PU = Pre-ulcerative lesion F = Fissure, S = Swelling, D = Dryness Assessment Right: Left: Other Deformity: No No Prior Foot Ulcer: No No Prior Amputation: No No Charcot Joint: No No Ambulatory Status: Ambulatory Without Help Gait: Steady Electronic Signature(s) Signed: 08/14/2022 8:53:50 AM By: Carlene Coria RN Entered By: Carlene Coria on 08/13/2022 08:53:25 -------------------------------------------------------------------------------- Nutrition Risk Screening Details Patient Name: Date of Service: Patrick [name], Patrick [name] LMA DGE Patrick [name]. 08/13/2022 8:45 A M Medical Record Number: [id number] Patient Account Number: [id number] Date of Birth/Sex: Treating RN: 11-Mar-1936 (85 y.o. Oval Linsey Primary Care Berneice Zettlemoyer: Viviana Simpler Other Clinician: Referring Traeger Sultana: Treating Kaliann Coryell/Extender: Helayne [place], [place] Weeks in Treatment: 0 Height (in): Weight (lbs): Body Mass Index (BMI): Nutrition Risk Screening Items Score Screening NUTRITION RISK SCREEN: I have an illness or condition that made me change the kind and/or amount of food I eat 2 Yes I eat fewer than two meals per day 0 No I eat few fruits and vegetables, or milk products 0 No I have three or more drinks of beer, liquor or wine almost every day 0 No I have tooth or mouth problems that make it hard for me to eat 0 No I don't always have enough money to buy the food I need 0 No Patrick [name], Patrick [name] Patrick [name] ([id number]) [phone] [id number] Page 5 of 5 I eat alone most of the time 0 No I take three or more different prescribed or over-the-counter drugs a day 1 Yes Without wanting to, I have lost or gained 10 pounds in the last six months 0 No I am not always physically able to shop, cook and/or feed myself 0 No Nutrition Protocols Good Risk Protocol Moderate Risk Protocol 0 Provide education on nutrition High Risk Proctocol Risk Level: Moderate Risk Score: 3 Electronic Signature(s) Signed:  08/14/2022 8:53:50 AM By: Carlene Coria RN Entered By: Carlene Coria on 08/13/2022 08:53:16

## 2022-08-14 NOTE — Patient Instructions (Signed)
Recommend zinc oxide paste or Vaseline 1-3 times daily as protectant to area at buttocks crease  Wound Care Instructions  Cleanse wound gently with soap and water once a day then pat dry with clean gauze. Apply a thin coat of Petrolatum (petroleum jelly, "Vaseline") over the wound (unless you have an allergy to this). We recommend that you use a new, sterile tube of Vaseline. Do not pick or remove scabs. Do not remove the yellow or white "healing tissue" from the base of the wound.  Cover the wound with fresh, clean, nonstick gauze and secure with paper tape. You may use Band-Aids in place of gauze and tape if the wound is small enough, but would recommend trimming much of the tape off as there is often too much. Sometimes Band-Aids can irritate the skin.  You should call the office for your biopsy report after 1 week if you have not already been contacted.  If you experience any problems, such as abnormal amounts of bleeding, swelling, significant bruising, significant pain, or evidence of infection, please call the office immediately.  FOR ADULT SURGERY PATIENTS: If you need something for pain relief you may take 1 extra strength Tylenol (acetaminophen) AND 2 Ibuprofen ('200mg'$  each) together every 4 hours as needed for pain. (do not take these if you are allergic to them or if you have a reason you should not take them.) Typically, you may only need pain medication for 1 to 3 days.   Due to recent changes in healthcare laws, you may see results of your pathology and/or laboratory studies on MyChart before the doctors have had a chance to review them. We understand that in some cases there may be results that are confusing or concerning to you. Please understand that not all results are received at the same time and often the doctors may need to interpret multiple results in order to provide you with the best plan of care or course of treatment. Therefore, we ask that you please give [place] 2 business days  to thoroughly review all your results before contacting the office for clarification. Should we see a critical lab result, you will be contacted sooner.   If You Need Anything After Your Visit  If you have any questions or concerns for your doctor, please call our main line at [phone] and press option 4 to reach your doctor's medical assistant. If no one answers, please leave a voicemail as directed and we will return your call as soon as possible. Messages left after 4 pm will be answered the following business day.   You may also send [place] a message via [place]. We typically respond to MyChart messages within 1-2 business days.  For prescription refills, please ask your pharmacy to contact our office. Our fax number is [phone].  If you have an urgent issue when the clinic is closed that cannot wait until the next business day, you can page your doctor at the number below.    Please note that while we do our best to be available for urgent issues outside of office hours, we are not available 24/7.   If you have an urgent issue and are unable to reach [place], you may choose to seek medical care at your doctor's office, retail clinic, urgent care center, or emergency room.  If you have a medical emergency, please immediately call 911 or go to the emergency department.  Pager Numbers  - Dr. Nehemiah Massed: [phone]  - Dr. Laurence Ferrari: [phone]  - Dr.  Nicole Kindred: [phone]  In the event of inclement weather, please call our main line at [phone] for an update on the status of any delays or closures.  Dermatology Medication Tips: Please keep the boxes that topical medications come in in order to help keep track of the instructions about where and how to use these. Pharmacies typically print the medication instructions only on the boxes and not directly on the medication tubes.   If your medication is too expensive, please contact our office at [phone] option 4 or send [place] a message  through [place].   We are unable to tell what your co-pay for medications will be in advance as this is different depending on your insurance coverage. However, we may be able to find a substitute medication at lower cost or fill out paperwork to get insurance to cover a needed medication.   If a prior authorization is required to get your medication covered by your insurance company, please allow [place] 1-2 business days to complete this process.  Drug prices often vary depending on where the prescription is filled and some pharmacies may offer cheaper prices.  The website www.goodrx.com contains coupons for medications through different pharmacies. The prices here do not account for what the cost may be with help from insurance (it may be cheaper with your insurance), but the website can give you the price if you did not use any insurance.  - You can print the associated coupon and take it with your prescription to the pharmacy.  - You may also stop by our office during regular business hours and pick up a GoodRx coupon card.  - If you need your prescription sent electronically to a different pharmacy, notify our office through [facility] or by phone at [phone] option 4.     Si Usted Necesita Algo Despus de Su Visita  Tambin puede enviarnos un mensaje a travs de Pharmacist, community. Por lo general respondemos a los mensajes de MyChart en el transcurso de 1 a 2 das hbiles.  Para renovar recetas, por favor pida a su farmacia que se ponga en contacto con nuestra oficina. Harland Dingwall de fax es [place] [phone].  Si tiene un asunto urgente cuando la clnica est cerrada y que no puede esperar hasta el siguiente da hbil, puede llamar/localizar a su doctor(a) al nmero que aparece a continuacin.   Por favor, tenga en cuenta que aunque hacemos todo lo posible para estar disponibles para asuntos urgentes fuera del horario de [place], no estamos disponibles las 24 horas del da, los 7 das de  la [place].   Si tiene un problema urgente y no puede comunicarse con nosotros, puede optar por buscar atencin mdica  en el consultorio de su doctor(a), en una clnica privada, en un centro de atencin urgente o en una sala de emergencias.  Si tiene Engineering geologist, por favor llame inmediatamente al 911 o vaya a la sala de emergencias.  Nmeros de bper  - Dr. Nehemiah Massed: [phone]  - Dra. Moye: [phone]  - Dra. Nicole Kindred: [phone]  En caso de inclemencias del [place], por favor llame a Johnsie Kindred principal al [phone] para una actualizacin sobre el [place] de cualquier retraso o cierre.  Consejos para la medicacin en dermatologa: Por favor, guarde las cajas en las que vienen los medicamentos de uso tpico para ayudarle a seguir las instrucciones sobre dnde y cmo usarlos. Las farmacias generalmente imprimen las instrucciones del medicamento slo en las cajas y no directamente en los tubos del  medicamento.   Si su medicamento es muy caro, por favor, pngase en contacto con Zigmund Daniel llamando al [phone] y presione la opcin 4 o envenos un mensaje a travs de Pharmacist, community.   No podemos decirle cul ser su copago por los medicamentos por adelantado ya que esto es diferente dependiendo de la cobertura de su seguro. Sin embargo, es posible que podamos encontrar un medicamento sustituto a Electrical engineer un formulario para que el seguro cubra el medicamento que se considera necesario.   Si se requiere una autorizacin previa para que su compaa de seguros [place] su medicamento, por favor permtanos de 1 a 2 das hbiles para completar este proceso.  Los precios de los medicamentos varan con frecuencia dependiendo del Environmental consultant de dnde se surte la receta y alguna farmacias pueden ofrecer precios ms baratos.  El sitio web www.goodrx.com tiene cupones para medicamentos de Airline pilot. Los precios aqu no tienen en cuenta lo que podra costar con la ayuda  del seguro (puede ser ms barato con su seguro), pero el sitio web puede darle el precio si no utiliz Research scientist (physical sciences).  - Puede imprimir el cupn correspondiente y llevarlo con su receta a la farmacia.  - Tambin puede pasar por nuestra oficina durante el horario de atencin regular y Charity fundraiser una tarjeta de cupones de GoodRx.  - Si necesita que su receta se enve electrnicamente a una farmacia diferente, informe a nuestra oficina a travs de MyChart de [place] o por telfono llamando al [phone] y presione la opcin 4.

## 2022-08-14 NOTE — Progress Notes (Signed)
OSHA, ERRICO ([id number]) [id number] Page 1 of 7 Visit Report for 08/13/2022 Chief Complaint Document Details Patient Name: Date of Service: Patrick [name], Patrick [name] LMA DGE J. 08/13/2022 8:45 A M Medical Record Number: [id number] Patient Account Number: [id number] Date of Birth/Sex: Treating RN: Jun 16, 1936 (86 y.o. Patrick [name]) Carlene Coria Primary Care Provider: Viviana Simpler Other Clinician: Referring Provider: Treating Provider/Extender: Helayne Seminole, [place] Weeks in Treatment: 0 Information Obtained from: Patient Chief Complaint Gluteal shearing and friction with no open wound today Electronic Signature(s) Signed: 08/13/2022 9:02:52 AM By: Worthy Keeler PA-C Entered By: Worthy Keeler on 08/13/2022 09:02:52 -------------------------------------------------------------------------------- HPI Details Patient Name: Date of Service: Patrick Lecher LMA DGE J. 08/13/2022 8:45 A M Medical Record Number: [id number] Patient Account Number: [id number] Date of Birth/Sex: Treating RN: 05-Jan-1936 (86 y.o. Patrick [name] Primary Care Provider: Viviana Simpler Other Clinician: Referring Provider: Treating Provider/Extender: Helayne [place], [place] in Treatment: 0 History of Present Illness HPI Description: 08-13-2022 upon evaluation today patient appears to be doing well currently in regard to his wound all things considered. He actually presents as a referral from Dr. Laurence Ferrari with regard to a wound that was considered an abscess/cellulitis on the gluteal region. With that being said I do not see anything that appears to be open at all at this point there is a dry area where there was obviously something at some point but I am not seeing that currently. Fortunately I do not see any evidence of active infection locally nor systemically which is great news. He does tell me that he tends to sit in his recliner a lot but not for extended periods of time he gets up at least  every hour if not sooner he just cannot sit that long. Patient does have a history of diabetes mellitus type 2 as well as hypertension otherwise he has no major medical problems. This has been going on currently for about 2 weeks. Electronic Signature(s) Signed: 08/13/2022 9:14:32 AM By: Worthy Keeler PA-C Entered By: Worthy Keeler on 08/13/2022 09:14:32 Patrick [name] ([id number]) [id number] Page 2 of 7 -------------------------------------------------------------------------------- Physical Exam Details Patient Name: Date of Service: Patrick [name], Patrick [name] LMA DGE J. 08/13/2022 8:45 A M Medical Record Number: [id number] Patient Account Number: [id number] Date of Birth/Sex: Treating RN: 1936/01/19 (86 y.o. Patrick [name] Primary Care Provider: Viviana Simpler Other Clinician: Referring Provider: Treating Provider/Extender: Helayne [place], [place] Weeks in Treatment: 0 Constitutional sitting or standing blood pressure is within target range for patient.. pulse regular and within target range for patient.Marland Kitchen respirations regular, non-labored and within target range for patient.Marland Kitchen temperature within target range for patient.. Well-nourished and well-hydrated in no acute distress. Eyes conjunctiva clear no eyelid edema noted. pupils equal round and reactive to light and accommodation. Ears, Nose, Mouth, and Throat no gross abnormality of ear auricles or external auditory canals. normal hearing noted during conversation. mucus membranes moist. Respiratory normal breathing without difficulty. Musculoskeletal normal gait and posture. no significant deformity or arthritic changes, no loss or range of motion, no clubbing. Psychiatric this patient is able to make decisions and demonstrates good insight into disease process. Alert and Oriented x 3. pleasant and cooperative. Notes Upon inspection patient's wound bed actually showed signs of good epithelization at this  point. Fortunately there does not appear to be any evidence of infection locally or systemically which is great news and overall I am extremely pleased with where things stand today. There is some dry skin in this area and I do think  the patient would benefit from the use of AandD ointment to help with moisturizing the region and he is in agreement with that plan. Otherwise I do not believe that he needs the mupirocin any longer. I discussed that with him today as well. Electronic Signature(s) Signed: 08/13/2022 9:15:18 AM By: Worthy Keeler PA-C Entered By: Worthy Keeler on 08/13/2022 09:15:17 -------------------------------------------------------------------------------- Physician Orders Details Patient Name: Date of Service: Patrick Lecher LMA DGE J. 08/13/2022 8:45 A M Medical Record Number: [id number] Patient Account Number: [id number] Date of Birth/Sex: Treating RN: October 30, 1935 (86 y.o. Patrick [name] Primary Care Provider: Viviana Simpler Other Clinician: Referring Provider: Treating Provider/Extender: Helayne [place], [place] in Treatment: 0 Verbal / Phone Orders: No Diagnosis [address] Patrick [name], Patrick [name] ([id number]) [id number] Page 3 of 7 ICD-10 Coding Code Description L98.8 Other specified disorders of the skin and subcutaneous tissue E11.622 Type 2 diabetes mellitus with other skin ulcer I10 Essential (primary) hypertension Discharge From [place] Only - apply AandD ointment 2 times per day or after showers Electronic Signature(s) Signed: 08/13/2022 3:55:23 PM By: Worthy Keeler PA-C Signed: 08/14/2022 8:53:50 AM By: Carlene Coria RN Entered By: Carlene Coria on 08/13/2022 09:13:00 -------------------------------------------------------------------------------- Problem List Details Patient Name: Date of Service: Patrick Lecher LMA DGE J. 08/13/2022 8:45 A M Medical Record Number: [id number] Patient Account Number: [id number] Date of  Birth/Sex: Treating RN: Nov 16, 1935 (86 y.o. Patrick [name] Primary Care Provider: Viviana Simpler Other Clinician: Referring Provider: Treating Provider/Extender: Helayne Semin[place], [place] in Treatment: 0 Active Problems ICD-10 Encounter Code Description Active Date MDM Diagnosis L98.8 Other specified disorders of the skin and subcutaneous tissue 08/13/2022 No Yes E11.622 Type 2 diabetes mellitus with other skin ulcer 08/13/2022 No Yes I10 Essential (primary) hypertension 08/13/2022 No Yes Inactive Problems Resolved Problems Electronic Signature(s) Signed: 08/13/2022 9:02:26 AM By: Worthy Keeler PA-C Entered By: Worthy Keeler on 08/13/2022 09:02:26 Patrick [name] ([id number]) [id number] Page 4 of 7 -------------------------------------------------------------------------------- Progress Note Details Patient Name: Date of Service: Patrick [name], Patrick [name] LMA DGE J. 08/13/2022 8:45 A M Medical Record Number: [id number] Patient Account Number: [id number] Date of Birth/Sex: Treating RN: 04/14/1936 (86 y.o. Patrick [name]) Carlene Coria Primary Care Provider: Viviana Simpler Other Clinician: Referring Provider: Treating Provider/Extender: Helayne [place], [place] Weeks in Treatment: 0 Subjective Chief Complaint Information obtained from Patient Gluteal shearing and friction with no open wound today History of Present Illness (HPI) 08-13-2022 upon evaluation today patient appears to be doing well currently in regard to his wound all things considered. He actually presents as a referral from Dr. Laurence Ferrari with regard to a wound that was considered an abscess/cellulitis on the gluteal region. With that being said I do not see anything that appears to be open at all at this point there is a dry area where there was obviously something at some point but I am not seeing that currently. Fortunately I do not see any evidence of active infection locally nor systemically which is  great news. He does tell me that he tends to sit in his recliner a lot but not for extended periods of time he gets up at least every hour if not sooner he just cannot sit that long. Patient does have a history of diabetes mellitus type 2 as well as hypertension otherwise he has no major medical problems. This has been going on currently for about 2 weeks. Patient History Information obtained from Patient. Allergies No Known Allergies Social History Never smoker, Marital Status - Single, Alcohol Use -  Never, Drug Use - No History, Caffeine Use - Daily. Medical History Cardiovascular Patient has history of Hypertension Endocrine Patient has history of Type II Diabetes Patient is treated with Oral Agents. Review of Systems (ROS) Integumentary (Skin) Complains or has symptoms of Wounds. Objective Constitutional sitting or standing blood pressure is within target range for patient.. pulse regular and within target range for patient.Marland Kitchen respirations regular, non-labored and within target range for patient.Marland Kitchen temperature within target range for patient.. Well-nourished and well-hydrated in no acute distress. Vitals Time Taken: 8:47 AM, Height: 68 in, Source: Stated, Weight: 187 lbs, Source: Stated, BMI: 28.4, Temperature: 97.7 F, Pulse: 69 bpm, Respiratory Rate: 18 breaths/min, Blood Pressure: 138/79 mmHg. Eyes conjunctiva clear no eyelid edema noted. pupils equal round and reactive to light and accommodation. Ears, Nose, Mouth, and Throat no gross abnormality of ear auricles or external auditory canals. normal hearing noted during conversation. mucus membranes moist. Respiratory Patrick [name], Patrick [name] ([id number]) [id number] Page 5 of 7 normal breathing without difficulty. Musculoskeletal normal gait and posture. no significant deformity or arthritic changes, no loss or range of motion, no clubbing. Psychiatric this patient is able to make decisions and  demonstrates good insight into disease process. Alert and Oriented x 3. pleasant and cooperative. General Notes: Upon inspection patient's wound bed actually showed signs of good epithelization at this point. Fortunately there does not appear to be any evidence of infection locally or systemically which is great news and overall I am extremely pleased with where things stand today. There is some dry skin in this area and I do think the patient would benefit from the use of AandD ointment to help with moisturizing the region and he is in agreement with that plan. Otherwise I do not believe that he needs the mupirocin any longer. I discussed that with him today as well. Assessment Active Problems ICD-10 Other specified disorders of the skin and subcutaneous tissue Type 2 diabetes mellitus with other skin ulcer Essential (primary) hypertension Plan Discharge From [facility]: Consult Only - apply AandD ointment 2 times per day or after showers 1. I am going to suggest that we have the patient continue to use AandD ointment really I think 3 times a day is probably a good way to go. 2. I am also can recommend that he continue to get up and move around frequently that I think is the biggest thing to do in order to prevent this from continuing to be an ongoing issue. 3. I am also going to suggest that the patient continue to monitor for any signs of infection. Obviously if anything changes he should let me know. We will see the patient back for a follow-up visit as needed. Electronic Signature(s) Signed: 08/13/2022 9:16:16 AM By: Worthy Keeler PA-C Entered By: Worthy Keeler on 08/13/2022 09:16:16 -------------------------------------------------------------------------------- ROS/PFSH Details Patient Name: Date of Service: Patrick Lecher LMA DGE J. 08/13/2022 8:45 A M Medical Record Number: [id number] Patient Account Number: [id number] Date of Birth/Sex: Treating RN: 24-Jul-1936 (85 y.o. Patrick [name] Primary Care Provider: Viviana Simpler Other Clinician: Referring Provider: Treating Provider/Extender: Helayne [place], [place] in Treatment: 0 Information Obtained From Patient Integumentary (Skin) Complaints and Symptoms: Positive for: Wounds ANIL, HAVARD ([id number]) [id number] Page 6 of 7 Cardiovascular Medical History: Positive for: Hypertension Endocrine Medical History: Positive for: Type II Diabetes Time with diabetes: 15 Treated with: Oral agents Immunizations Pneumococcal Vaccine: Received Pneumococcal Vaccination: Yes Received Pneumococcal Vaccination On or After 60th Birthday: Yes Implantable Devices None  Family and Social History Never smoker; Marital Status - Single; Alcohol Use: Never; Drug Use: No History; Caffeine Use: Daily Electronic Signature(s) Signed: 08/13/2022 3:55:23 PM By: Worthy Keeler PA-C Signed: 08/14/2022 8:53:50 AM By: Carlene Coria RN Entered By: Carlene Coria on 08/13/2022 09:05:18 -------------------------------------------------------------------------------- SuperBill Details Patient Name: Date of Service: Patrick Lecher LMA DGE J. 08/13/2022 Medical Record Number: [id number] Patient Account Number: [id number] Date of Birth/Sex: Treating RN: [birth year] (86 y.o. Patrick [name]) Carlene Coria Primary Care Provider: Viviana Simpler Other Clinician: Referring Provider: Treating Provider/Extender: Helayne [place], [place] in Treatment: 0 Diagnosis Coding ICD-10 Codes Code Description L98.8 Other specified disorders of the skin and subcutaneous tissue E11.622 Type 2 diabetes mellitus with other skin ulcer I10 Essential (primary) hypertension Facility Procedures : 7 CPT4 Code: [id number] Description: [phone] - WOUND CARE VISIT-LEV 2 EST PT Modifier: Quantity: 1 Physician Procedures : CPT4 Code Description Modifier [id number] [place] PHYS LEVEL 3 NEW PT ICD-10 Diagnosis Description L98.8 Other specified  disorders of the skin and subcutaneous tissue E11.622 Type 2 diabetes mellitus with other skin ulcer I10 Essential (primary) hypertension  HARLESS, MOLINARI J ([id number]) [id number] Quantity: 1 f Page 7 of 7 Electronic Signature(s) Signed: 08/13/2022 9:16:26 AM By: Worthy Keeler PA-C Entered By: Worthy Keeler on 08/13/2022 09:16:26

## 2022-08-14 NOTE — Progress Notes (Signed)
   Follow-Up Visit   Subjective  Patrick [name] is a 86 y.o. male who presents for the following: Procedure (Patient here today to treat bx proven SCCis at left elbow. ).  The following portions of the chart were reviewed this encounter and updated as appropriate:   Tobacco  Allergies  Meds  Problems  Med Hx  Surg Hx  Fam Hx      Review of Systems:  No other skin or systemic complaints except as noted in HPI or Assessment and Plan.  Objective  Well appearing patient in no apparent distress; mood and affect are within normal limits.  A focused examination was performed including left arm, buttocks. Relevant physical exam findings are noted in the Assessment and Plan.  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  Destruction of lesion  Destruction method: electrodesiccation and curettage   Informed consent: discussed and consent obtained   Timeout:  patient name, date of birth, surgical site, and procedure verified Anesthesia: the lesion was anesthetized in a standard fashion   Anesthetic:  1% lidocaine w/ epinephrine 1-100,000 buffered w/ 8.4% NaHCO3 Curettage performed in three different directions: Yes   Electrodesiccation performed over the curetted area: Yes   Curettage cycles:  3 Final wound size (cm):  1.6 Hemostasis achieved with:  electrodesiccation Outcome: patient tolerated procedure well with no complications   Post-procedure details: sterile dressing applied and wound care instructions given   Dressing type: petrolatum    Pressure injury of sacral region, stage 1 Gluteal Crease  Stage 1  Recommend zinc oxide paste or Vaseline 1-3 times daily as protectant. Continue changing position, avoiding pressure to area.     Return for TBSE, as scheduled.  Graciella Belton, RMA, am acting as scribe for Forest Gleason, MD .  Documentation: I have reviewed the  above documentation for accuracy and completeness, and I agree with the above.  Forest Gleason, MD

## 2022-08-14 NOTE — Progress Notes (Signed)
KINGSTEN, ENFIELD ([id number]) [id number] Page 1 of 7 Visit Report for 08/13/2022 Allergy List Details Patient Name: Date of Service: Patrick [name], Patrick [name] LMA DGE J. 08/13/2022 8:45 A M Medical Record Number: [id number] Patient Account Number: [id number] Date of Birth/Sex: Treating RN: Mar 31, 1936 (85 y.o. Patrick [name]) Carlene Coria Primary Care Mercedees Convery: Viviana Simpler Other Clinician: Referring Kaymon Denomme: Treating Leonard Hendler/Extender: Helayne Seminole, [place] in Treatment: 0 Allergies Active Allergies No Known Allergies Allergy Notes Electronic Signature(s) Signed: 08/14/2022 8:53:50 AM By: Carlene Coria RN Entered By: Carlene Coria on 08/13/2022 08:49:11 -------------------------------------------------------------------------------- Badger Details Patient Name: Date of Service: Patrick [name] LMA DGE J. 08/13/2022 8:45 A M Medical Record Number: [id number] Patient Account Number: [id number] Date of Birth/Sex: Treating RN: 03/01/1936 (85 y.o. Oval Linsey Primary Care Mattie Novosel: Viviana Simpler Other Clinician: Referring Agnes Probert: Treating Madolyn Ackroyd/Extender: Helayne Seminole, [place] in Treatment: 0 Visit Information Patient Arrived: Ambulatory Arrival Time: 08:48 Accompanied By: self Transfer Assistance: None Patient Identification Verified: Yes Secondary Verification Process Completed: Yes Patient Requires Transmission-Based Precautions: No Patient Has Alerts: No Electronic Signature(s) Signed: 08/14/2022 8:53:50 AM By: Carlene Coria RN Entered By: Carlene Coria on 08/13/2022 08:48:47 [name] ([id number]) [id number] Page 2 of 7 -------------------------------------------------------------------------------- Clinic Level of Care Assessment Details Patient Name: Date of Service: Patrick [name], Patrick [name] LMA DGE J. 08/13/2022 8:45 A M Medical Record Number: [id number] Patient Account Number: [id number] Date of Birth/Sex:  Treating RN: 10-11-1936 (85 y.o. Patrick [name]) Carlene Coria Primary Care Royden Bulman: Viviana Simpler Other Clinician: Referring Thurma Priego: Treating Myisha Pickerel/Extender: Helayne Seminole, [place] in Treatment: 0 Clinic Level of Care Assessment Items TOOL 4 Quantity Score X- 1 0 Use when only an EandM is performed on FOLLOW-UP visit ASSESSMENTS - Nursing Assessment / Reassessment X- 1 10 Reassessment of Co-morbidities (includes updates in patient status) X- 1 5 Reassessment of Adherence to Treatment Plan ASSESSMENTS - Wound and Skin A ssessment / Reassessment '[]'$  - 0 Simple Wound Assessment / Reassessment - one wound '[]'$  - 0 Complex Wound Assessment / Reassessment - multiple wounds '[]'$  - 0 Dermatologic / Skin Assessment (not related to wound area) ASSESSMENTS - Focused Assessment '[]'$  - 0 Circumferential Edema Measurements - multi extremities '[]'$  - 0 Nutritional Assessment / Counseling / Intervention '[]'$  - 0 Lower Extremity Assessment (monofilament, tuning fork, pulses) '[]'$  - 0 Peripheral Arterial Disease Assessment (using hand held doppler) ASSESSMENTS - Ostomy and/or Continence Assessment and Care '[]'$  - 0 Incontinence Assessment and Management '[]'$  - 0 Ostomy Care Assessment and Management (repouching, etc.) PROCESS - Coordination of Care X - Simple Patient / Family Education for ongoing care 1 15 '[]'$  - 0 Complex (extensive) Patient / Family Education for ongoing care X- 1 10 Staff obtains Consents, Records, T Results / Process Orders est '[]'$  - 0 Staff telephones HHA, Nursing Homes / Clarify orders / etc '[]'$  - 0 Routine Transfer to another Facility (non-emergent condition) '[]'$  - 0 Routine Hospital Admission (non-emergent condition) '[]'$  - 0 New Admissions / Biomedical engineer / Ordering NPWT Apligraf, etc. , '[]'$  - 0 Emergency Hospital Admission (emergent condition) X- 1 10 Simple Discharge Coordination '[]'$  - 0 Complex (extensive) Discharge Coordination PROCESS - Special Needs '[]'$   - 0 Pediatric / Minor Patient Management '[]'$  - 0 Isolation Patient Management '[]'$  - 0 Hearing / Language / Visual special needs KEITHON, MCCOIN ([id number]) [id number] Page 3 of 7 '[]'$  - 0 Assessment of Community assistance (transportation, D/C planning, etc.) '[]'$  - 0 Additional assistance / Altered mentation '[]'$  - 0 Support Surface(s) Assessment (bed, cushion,  seat, etc.) INTERVENTIONS - Wound Cleansing / Measurement '[]'$  - 0 Simple Wound Cleansing - one wound '[]'$  - 0 Complex Wound Cleansing - multiple wounds '[]'$  - 0 Wound Imaging (photographs - any number of wounds) '[]'$  - 0 Wound Tracing (instead of photographs) '[]'$  - 0 Simple Wound Measurement - one wound '[]'$  - 0 Complex Wound Measurement - multiple wounds INTERVENTIONS - Wound Dressings '[]'$  - 0 Small Wound Dressing one or multiple wounds '[]'$  - 0 Medium Wound Dressing one or multiple wounds '[]'$  - 0 Large Wound Dressing one or multiple wounds X- 1 5 Application of Medications - topical '[]'$  - 0 Application of Medications - injection INTERVENTIONS - Miscellaneous '[]'$  - 0 External ear exam '[]'$  - 0 Specimen Collection (cultures, biopsies, blood, body fluids, etc.) '[]'$  - 0 Specimen(s) / Culture(s) sent or taken to Lab for analysis '[]'$  - 0 Patient Transfer (multiple staff / Civil Service fast streamer / Similar devices) '[]'$  - 0 Simple Staple / Suture removal (25 or less) '[]'$  - 0 Complex Staple / Suture removal (26 or more) '[]'$  - 0 Hypo / Hyperglycemic Management (close monitor of Blood Glucose) '[]'$  - 0 Ankle / Brachial Index (ABI) - do not check if billed separately X- 1 5 Vital Signs Has the patient been seen at the hospital within the last three years: Yes Total Score: 60 Level Of Care: New/Established - Level 2 Electronic Signature(s) Signed: 08/14/2022 8:53:50 AM By: Carlene Coria RN Entered By: Carlene Coria on 08/13/2022  09:13:46 -------------------------------------------------------------------------------- Encounter Discharge Information Details Patient Name: Date of Service: Patrick [name] LMA DGE J. 08/13/2022 8:45 A M Medical Record Number: [id number] Patient Account Number: [id number] Date of Birth/Sex: Treating RN: Feb 22, 1936 (85 y.o. Oval Linsey Primary Care Patrick [name]: Viviana Simpler Other Clinician: Referring Essa Wenk: Treating Sequoia Witz/Extender: Helayne Seminole, [place] in Treatment: 0 [place], Patrick [name] ([id number]) [id number] Page 4 of 7 Encounter Discharge Information Items Discharge Condition: Stable Ambulatory Status: Ambulatory Discharge Destination: Home Transportation: Private Auto Accompanied By: self Schedule Follow-up Appointment: Yes Clinical Summary of Care: Electronic Signature(s) Signed: 08/14/2022 8:53:50 AM By: Carlene Coria RN Entered By: Carlene Coria on 08/13/2022 09:14:28 -------------------------------------------------------------------------------- Lower Extremity Assessment Details Patient Name: Date of Service: Patrick [name] LMA DGE J. 08/13/2022 8:45 A M Medical Record Number: [id number] Patient Account Number: [id number] Date of Birth/Sex: Treating RN: 1936-03-23 (85 y.o. Patrick [name]) Carlene Coria Primary Care Patrick [name]: Viviana Simpler Other Clinician: Referring Jamesetta Greenhalgh: Treating Nainoa Woldt/Extender: Helayne Seminole, [place] in Treatment: 0 Electronic Signature(s) Signed: 08/13/2022 9:21:10 AM By: Carlene Coria RN Entered By: Carlene Coria on 08/13/2022 09:21:10 -------------------------------------------------------------------------------- Multi Wound Chart Details Patient Name: Date of Service: Patrick [name] LMA DGE J. 08/13/2022 8:45 A M Medical Record Number: [id number] Patient Account Number: [id number] Date of Birth/Sex: Treating RN: Nov 19, 1935 (85 y.o. Oval Linsey Primary Care Patrick [name]: Viviana Simpler Other  Clinician: Referring Davene Jobin: Treating Chibuike Fleek/Extender: Helayne Seminole, [place] in Treatment: 0 Vital Signs Height(in): 68 Pulse(bpm): 69 Weight(lbs): 187 Blood Pressure(mmHg): 138/79 Body Mass Index(BMI): 28.4 Temperature(F): 97.7 Respiratory Rate(breaths/min): 18 Schoenfeldt, Ordean J ([id number]) Electronic Signature(s) Signed: 08/14/2022 8:53:50 AM By: Carlene Coria RN Entered By: Carlene Coria on 08/13/2022 09:10:20 -------------------------------------------------------------------------------- Multi-Disciplinary Care Plan Details Patient Name: Date of Service: Patrick [name] LMA DGE J. 08/13/2022 8:45 A M Medical Record Number: [id number] Patient Account Number: [id number] Date of Birth/Sex: Treating RN: Jan 09, 1936 (85 y.o. Oval Linsey Primary Care Marqueta Pulley: Viviana Simpler Other Clinician: Referring Skylur Fuston: Treating Yalda Herd/Extender: Helayne Seminole, [place] We[place] in Treatment: 0 Active Inactive Electronic Signature(s) Signed:  08/14/2022 8:53:50 AM By: Carlene Coria RN Entered By: Carlene Coria on 08/13/2022 09:08:30 -------------------------------------------------------------------------------- Pain Assessment Details Patient Name: Date of Service: Patrick [name], Patrick [name] LMA DGE J. 08/13/2022 8:45 A M Medical Record Number: [id number] Patient Account Number: [id number] Date of Birth/Sex: Treating RN: 11-11-1935 (85 y.o. Oval Linsey Primary Care Kaliann Coryell: Viviana Simpler Other Clinician: Referring Brewster Wolters: Treating Kirstein Baxley/Extender: Helayne Seminole, [place] in Treatment: 0 Active Problems Location of Pain Severity and Description of Pain Patient Has Paino No Site Locations [place] ([id number]) [id number] Page 6 of 7 Pain Management and Medication Current Pain Management: Electronic Signature(s) Signed: 08/14/2022 8:53:50 AM By: Carlene Coria RN Entered By: Carlene Coria on 08/13/2022  08:49:00 -------------------------------------------------------------------------------- Patient/Caregiver Education Details Patient Name: Date of Service: Patrick [name] LMA DGE J. 10/17/2023andnbsp8:45 A M Medical Record Number: [id number] Patient Account Number: [id number] Date of Birth/Gender: Treating RN: 1935-10-29 (85 y.o. Oval Linsey Primary Care Physician: Viviana Simpler Other Clinician: Referring Physician: Treating Physician/Extender: Helayne Seminole, [place] in Treatment: 0 Education Assessment Education Provided To: Patient Education Topics Provided Wound/Skin Impairment: Methods: Explain/Verbal Responses: State content correctly Electronic Signature(s) Signed: 08/14/2022 8:53:50 AM By: Carlene Coria RN Entered By: Carlene Coria on 08/13/2022 09:14:02 [name] ([id number]) [id number] Page 7 of 7 -------------------------------------------------------------------------------- Vitals Details Patient Name: Date of Service: Patrick [name], Patrick [name] LMA DGE J. 08/13/2022 8:45 A M Medical Record Number: [id number] Patient Account Number: [id number] Date of Birth/Sex: Treating RN: October 10, 1936 (85 y.o. Patrick [name]) Carlene Coria Primary Care Loise Esguerra: Viviana Simpler Other Clinician: Referring Rishi Vicario: Treating Yobany Vroom/Extender: Helayne Seminole, [place] in Treatment: 0 Vital Signs Time Taken: 08:47 Temperature (F): 97.7 Height (in): 68 Pulse (bpm): 69 Source: Stated Respiratory Rate (breaths/min): 18 Weight (lbs): 187 Blood Pressure (mmHg): 138/79 Source: Stated Reference Range: 80 - 120 mg / dl Body Mass Index (BMI): 28.4 Electronic Signature(s) Signed: 08/14/2022 8:53:50 AM By: Carlene Coria RN Entered By: Carlene Coria on 08/13/2022 08:54:54

## 2022-08-22 ENCOUNTER — Encounter: Payer: Self-pay | Admitting: Dermatology

## 2022-08-27 ENCOUNTER — Other Ambulatory Visit: Payer: Self-pay | Admitting: Dermatology

## 2022-08-27 DIAGNOSIS — H401131 Primary open-angle glaucoma, bilateral, mild stage: Secondary | ICD-10-CM | POA: Diagnosis not present

## 2022-08-27 DIAGNOSIS — L98491 Non-pressure chronic ulcer of skin of other sites limited to breakdown of skin: Secondary | ICD-10-CM

## 2022-08-29 ENCOUNTER — Other Ambulatory Visit: Payer: Self-pay | Admitting: Internal Medicine

## 2022-08-29 DIAGNOSIS — F39 Unspecified mood [affective] disorder: Secondary | ICD-10-CM

## 2022-08-29 NOTE — Telephone Encounter (Signed)
Last filled 07-30-22 #60 Last OV 03-26-22 Next OV 09-27-22 [place]

## 2022-09-04 DIAGNOSIS — H401131 Primary open-angle glaucoma, bilateral, mild stage: Secondary | ICD-10-CM | POA: Diagnosis not present

## 2022-09-04 LAB — HM DIABETES EYE EXAM

## 2022-09-04 IMAGING — US US ABDOMEN COMPLETE
2 series · 13 of 25 positions shown · non-contrast
Comparison: Renal ultrasound exam 06/20/2009.

CLINICAL DATA: Right leg swelling. Evaluate for organomegaly or
mass.

EXAM:
ABDOMEN ULTRASOUND COMPLETE

[Series 1: abdomen us · 12 of 112 slices shown (1 of 2)]
[im 1/112]
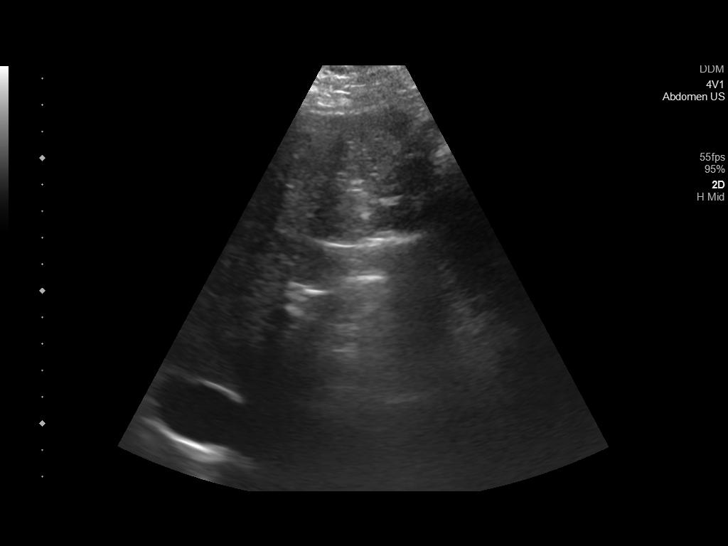
[im 10/112]
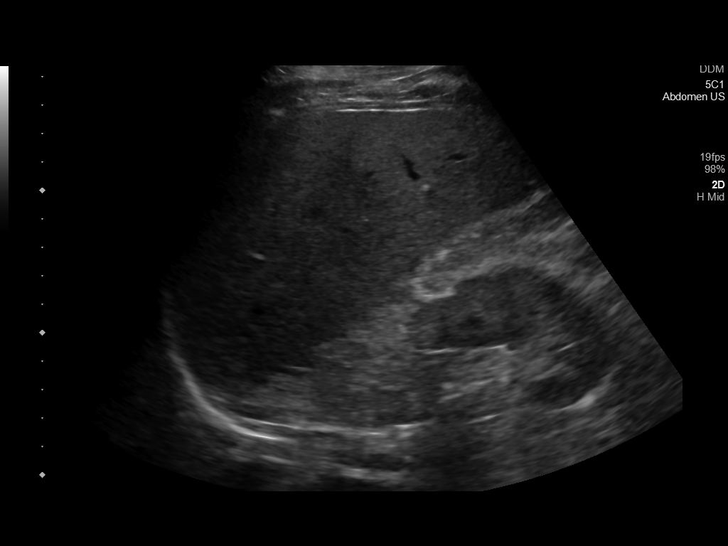
[im 20/112]
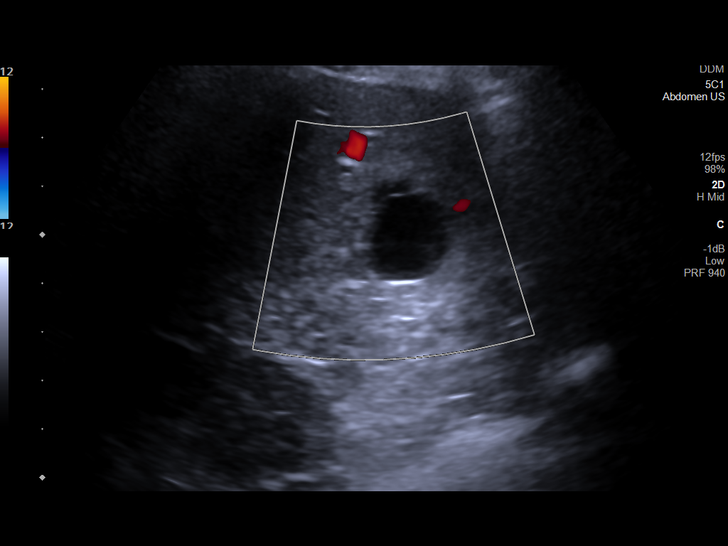
[im 29/112]
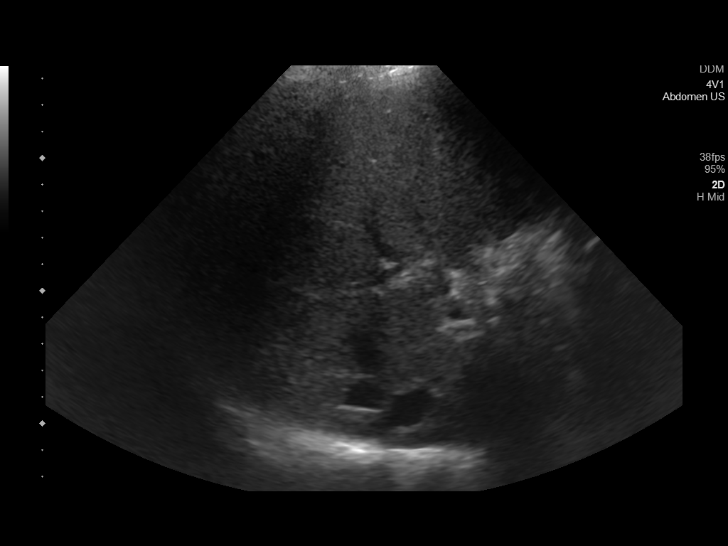
[im 39/112]
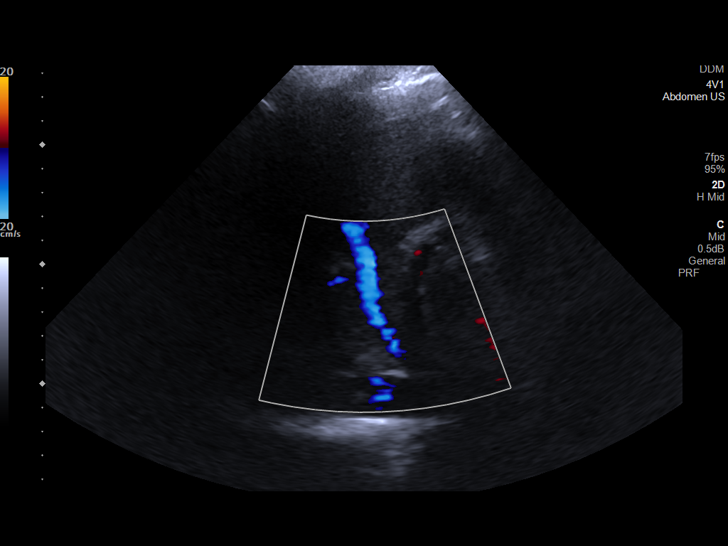
[im 49/112]
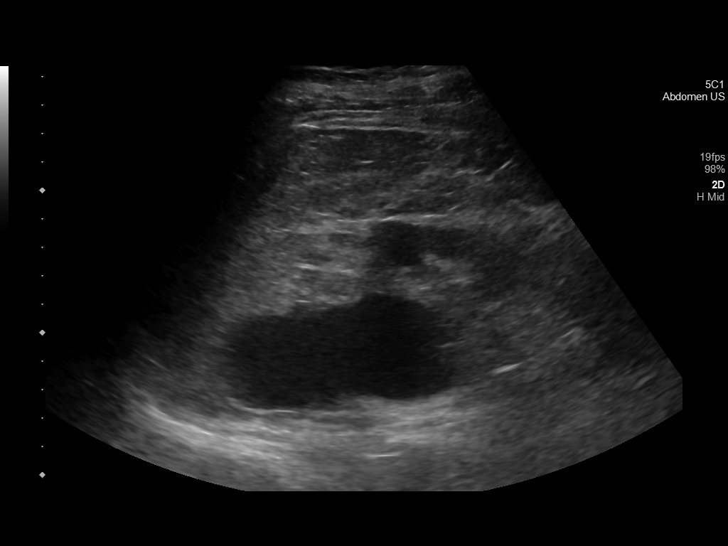
[im 58/112]
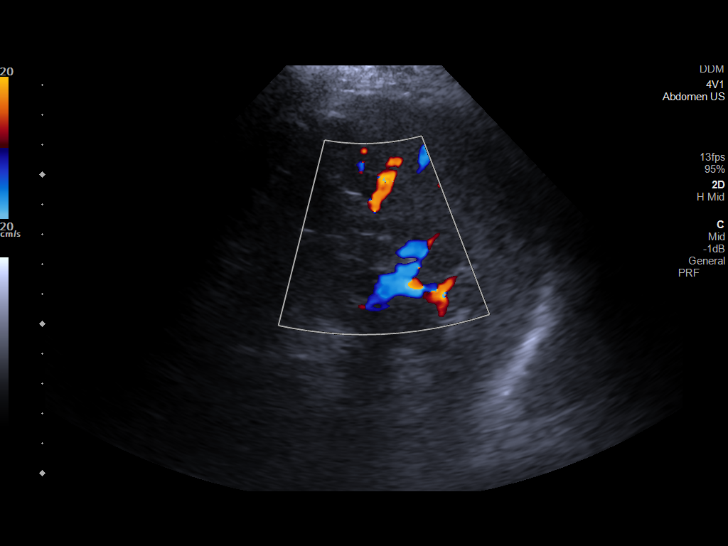
[im 68/112]
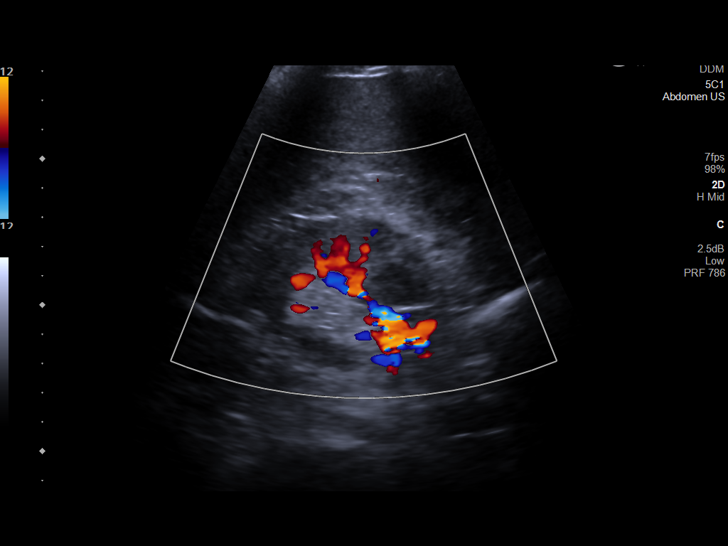
[im 78/112]
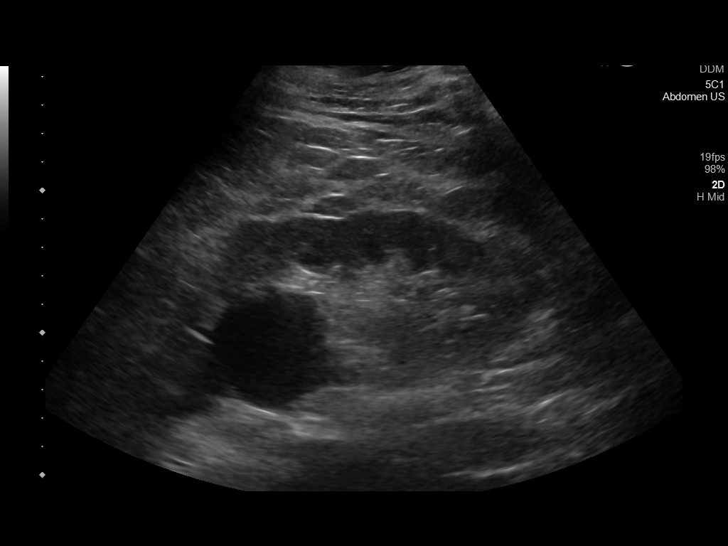
[im 87/112]
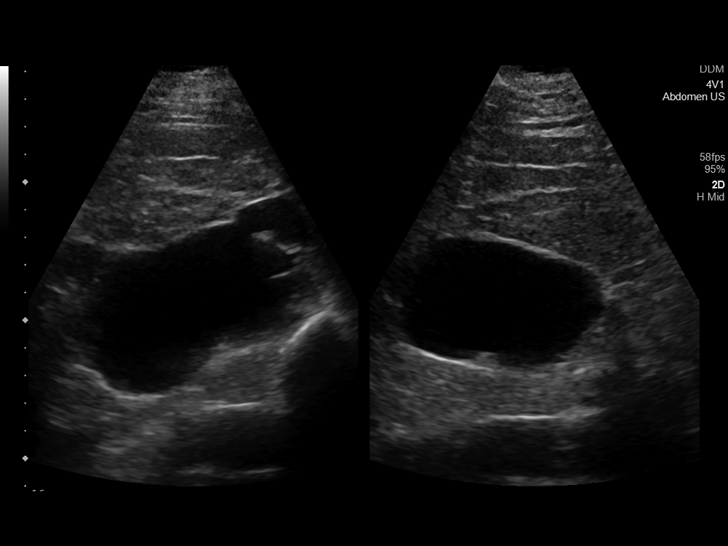
[im 97/112]
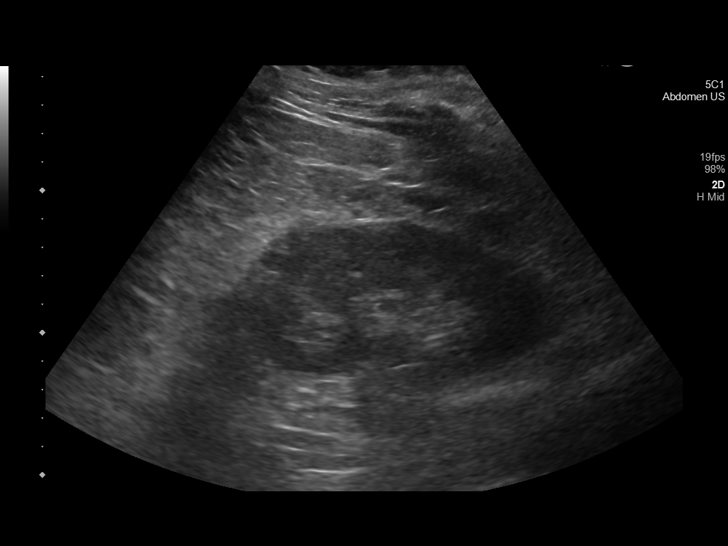
[im 107/112]
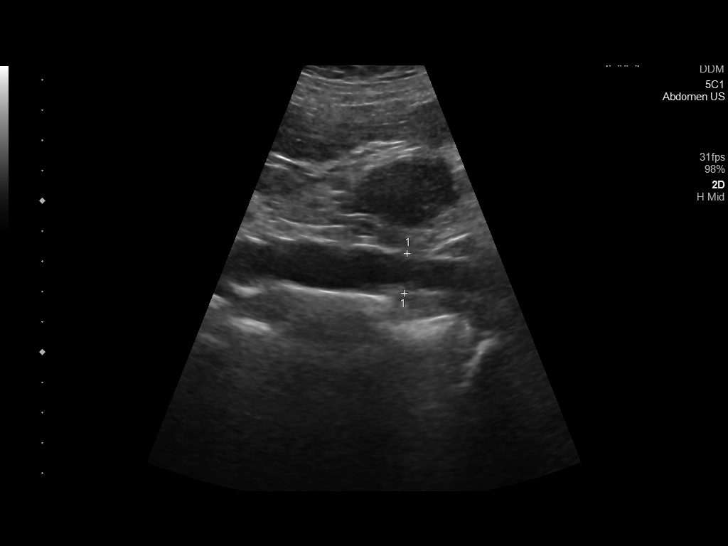

[Series 1001: abdomen us · 1 of 1 slices shown (2 of 2)]
[im 1/1]
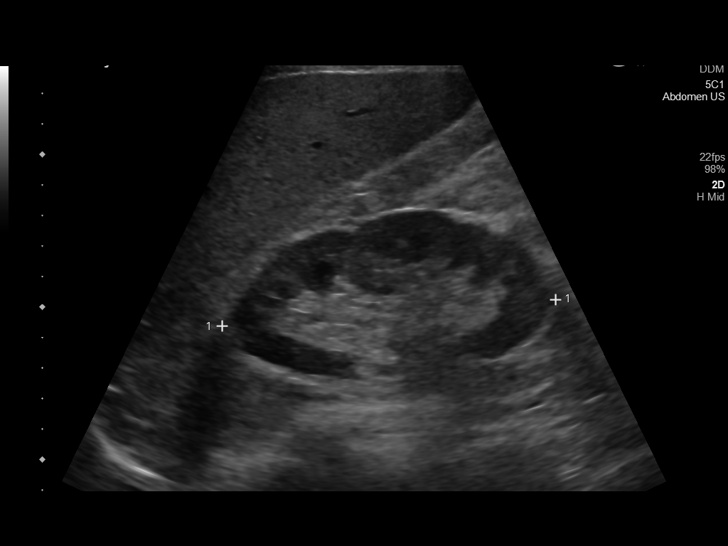

[13 of 25 positions shown; findings below may reference images not displayed]

FINDINGS: Gallbladder: Surgically absent.

Common bile duct: Diameter: 9 mm.

Liver: Septated cystic lesion noted right liver, measuring up to
cm. No hepatomegaly. Portal vein is patent on color Doppler imaging
with normal direction of blood flow towards the liver.

IVC: No abnormality visualized.

Pancreas: Obscured by bowel gas.

Spleen: Size and appearance within normal limits.

Right Kidney: Length: 11 cm. Echogenicity within normal limits. No
mass or hydronephrosis visualized.

Left Kidney: Length: 13.2 cm. Complex cystic lesion upper pole left
kidney measures 9.7 x 4.6 x 8.1 cm, mildly increased from 8.4 x
x 5.0 cm previously.

Abdominal aorta: No aneurysm visualized.

Other findings: None.
IMPRESSION: 2.8 cm septated cystic lesion subcapsular right liver.

9.7 cm complex cystic lesion upper pole left kidney. This has
increased only minimally in size since [DATE], most compatible with
benign etiology.

## 2022-09-27 ENCOUNTER — Ambulatory Visit (INDEPENDENT_AMBULATORY_CARE_PROVIDER_SITE_OTHER): Payer: PPO | Admitting: Internal Medicine

## 2022-09-27 ENCOUNTER — Encounter: Payer: Self-pay | Admitting: Internal Medicine

## 2022-09-27 VITALS — BP 136/80 | HR 82 | Temp 97.9°F | Ht 66.25 in | Wt 183.0 lb

## 2022-09-27 DIAGNOSIS — Z0000 Encounter for general adult medical examination without abnormal findings: Secondary | ICD-10-CM | POA: Diagnosis not present

## 2022-09-27 DIAGNOSIS — F39 Unspecified mood [affective] disorder: Secondary | ICD-10-CM

## 2022-09-27 DIAGNOSIS — S069X0S Unspecified intracranial injury without loss of consciousness, sequela: Secondary | ICD-10-CM

## 2022-09-27 DIAGNOSIS — N1831 Chronic kidney disease, stage 3a: Secondary | ICD-10-CM

## 2022-09-27 DIAGNOSIS — I10 Essential (primary) hypertension: Secondary | ICD-10-CM | POA: Diagnosis not present

## 2022-09-27 DIAGNOSIS — E1142 Type 2 diabetes mellitus with diabetic polyneuropathy: Secondary | ICD-10-CM | POA: Diagnosis not present

## 2022-09-27 LAB — RENAL FUNCTION PANEL
Albumin: 4.4 g/dL (ref 3.5–5.2)
BUN: 33 mg/dL — ABNORMAL HIGH (ref 6–23)
CO2: 26 mEq/L (ref 19–32)
Calcium: 9.3 mg/dL (ref 8.4–10.5)
Chloride: 99 mEq/L (ref 96–112)
Creatinine, Ser: 1.16 mg/dL (ref 0.40–1.50)
GFR: 58.01 mL/min — ABNORMAL LOW (ref >60.00)
Glucose, Bld: 184 mg/dL — ABNORMAL HIGH (ref 70–99)
Phosphorus: 3.7 mg/dL (ref 2.3–4.6)
Potassium: 4.8 mEq/L (ref 3.5–5.1)
Sodium: 139 mEq/L (ref 135–145)

## 2022-09-27 LAB — HEPATIC FUNCTION PANEL
ALT: 15 U/L (ref 0–53)
AST: 13 U/L (ref 0–37)
Albumin: 4.4 g/dL (ref 3.5–5.2)
Alkaline Phosphatase: 58 U/L (ref 39–117)
Bilirubin, Direct: 0.1 mg/dL (ref 0.0–0.3)
Total Bilirubin: 0.4 mg/dL (ref 0.2–1.2)
Total Protein: 6.9 g/dL (ref 6.0–8.3)

## 2022-09-27 LAB — CBC
HCT: 42.4 % (ref 39.0–52.0)
Hemoglobin: 14.2 g/dL (ref 13.0–17.0)
MCHC: 33.4 g/dL (ref 30.0–36.0)
MCV: 94.6 fl (ref 78.0–100.0)
Platelets: 224 10*3/uL (ref 150.0–400.0)
RBC: 4.49 Mil/uL (ref 4.22–5.81)
RDW: 12.6 % (ref 11.5–15.5)
WBC: 4.7 10*3/uL (ref 4.0–10.5)

## 2022-09-27 LAB — MICROALBUMIN / CREATININE URINE RATIO
Creatinine,U: 178.6 mg/dL
Microalb Creat Ratio: 2.7 mg/g (ref 0.0–30.0)
Microalb, Ur: 4.8 mg/dL — ABNORMAL HIGH (ref 0.0–1.9)

## 2022-09-27 LAB — LIPID PANEL
Cholesterol: 173 mg/dL (ref 0–200)
HDL: 36.1 mg/dL — ABNORMAL LOW (ref >39.00)
NonHDL: 136.71
Total CHOL/HDL Ratio: 5
Triglycerides: 222 mg/dL — ABNORMAL HIGH (ref 0.0–149.0)
VLDL: 44.4 mg/dL — ABNORMAL HIGH (ref 0.0–40.0)

## 2022-09-27 LAB — HM DIABETES FOOT EXAM

## 2022-09-27 LAB — TSH: TSH: 1.69 u[IU]/mL (ref 0.35–5.50)

## 2022-09-27 LAB — LDL CHOLESTEROL, DIRECT: Direct LDL: 114 mg/dL

## 2022-09-27 LAB — HEMOGLOBIN A1C: Hgb A1c MFr Bld: 7.9 % — ABNORMAL HIGH (ref 4.6–6.5)

## 2022-09-27 NOTE — Assessment & Plan Note (Signed)
Seems to still have good control on metformin 1000 bid and glimepiride 4 bid Gabapentin 200 at bedtime helps the neuropathy

## 2022-09-27 NOTE — Assessment & Plan Note (Signed)
Borderline Is on the lisinopril Will recheck

## 2022-09-27 NOTE — Assessment & Plan Note (Signed)
Some reactive depression/grieving No persistent depression Uses the xanax occasionally for panic/anxiety symptoms

## 2022-09-27 NOTE — Progress Notes (Signed)
Subjective:    Patient ID: Patrick [name], male    DOB: Mar 25, 1936, 86 y.o.   MRN: [id number]  HPI Here for Medicare wellness visit and follow up of chronic health conditions Reviewed advanced directives Reviewed other doctors--Dr King--ophthal, Dr Matthew Folks, Dr Gilles Chiquito surgery, Dr Guinevere Ferrari, wound clinic ([facility])--it healed No hospitalizations or surgery in the past year Vision is okay--recent eye exam Hearing is fine No alcohol or tobacco No set exercise routine but stays active [place] once --lost balance on porch. Slight injury (skin) Independent with instrumental ADLs  Legs will feel weak on him After walking for a while----he has to rest for a while (better then) Does his yard work himself---some push mowing Not really painful Not really swelling  Some sad feelings Had been helping watch sister---then she fell and broke wrist Now in rehab--probably won't make it home Some sadness--not persistent Not anhedonic Xanax prn for nerves  Memory is okay Persistent mild cognitive issues since 30 foot fall some years ago No functional changes---but will get panicky at times when driving alone [place]---will get vertigo)  Checks sugars daily 78-172 Mostly under 130 No hypoglycemic reactions Using the gabapentin 247m at bedtime Sleeps pretty well with this  No chest pain--just occ indigestion that gaviscon helps No dysphagia No SOB No dizziness or syncope Mild edema still --usually normal again in the morning  Last GFR better at 58  Current Outpatient Medications on File Prior to Visit  Medication Sig Dispense Refill   ALPRAZolam (XANAX) 1 MG tablet TAKE 1 TABLET BY MOUTH TWICE (2) DAILY AS NEEDED 60 tablet 0   Ascorbic Acid (VITAMIN C) 1000 MG tablet Take 1,000 mg by mouth daily.     aspirin 81 MG tablet Take 81 mg by mouth daily.     betamethasone dipropionate (DIPROLENE) 0.05 % ointment Apply to aa's psoriasis QD-BID PRN. Avoid the face,  groin, and axilla. 50 g 2   Blood Glucose Monitoring Suppl (ONE TOUCH ULTRA 2) w/Device KIT SMARTSIG:1 Each Via Meter As Directed     cholecalciferol (VITAMIN D) 1000 units tablet Take 1,000 Units by mouth daily.     fish oil-omega-3 fatty acids 1000 MG capsule Take 2 g by mouth daily.     gabapentin (NEURONTIN) 100 MG capsule TAKE 1 OR 2 CAPSULES BY MOUTH AT BEDTIME 60 capsule 11   glimepiride (AMARYL) 4 MG tablet TAKE 1 TABLET BY MOUTH TWICE A DAY FOR DIABETES 180 tablet 3   hydrochlorothiazide (HYDRODIURIL) 25 MG tablet TAKE 1 TABLET BY MOUTH ONCE DAILY 90 tablet 3   ketoconazole (NIZORAL) 2 % cream Apply twice daily as needed 60 g 1   ketoconazole (NIZORAL) 2 % shampoo apply three times per week, massage into scalp and leave in for 10 minutes before rinsing out 120 mL 11   latanoprost (XALATAN) 0.005 % ophthalmic solution Place 1 drop into both eyes Daily.     lisinopril (ZESTRIL) 40 MG tablet TAKE 1 TABLET BY MOUTH EVERY MORNING FORBLOOD PRESSURE 90 tablet 3   metFORMIN (GLUCOPHAGE) 1000 MG tablet TAKE 1 TABLETS BY MOUTH BEFORE BREAKFASTAND THEN 1 TABLET BEFORE DINNER AND 1/2 TABLET AT BEDTIME FOR DIABETES. 235 tablet 3   mupirocin ointment (BACTROBAN) 2 % APPLY 1 APPLICATION TOPICALLY 3 TIMES DAILY. APPLY TO OPEN AREAS BUTTOCKS CREASE AS DIRECTED 22 g 2   ONETOUCH DELICA LANCETS 337JMISC 1 Units by Does not apply route as needed. Use one to obtain blood sugar. Dx  E11.49 100 each 3  ONETOUCH ULTRA test strip USE TO CHECK BLOOD SUGAR TWICE DAILY DX E11.49 100 each 3   verapamil (CALAN-SR) 240 MG CR tablet TAKE 1 TABLET BY MOUTH EVERY MORNING FORBLOOD PRESSURE 90 tablet 3   vitamin E 400 UNIT capsule Take 400 Units by mouth daily.     Wound Dressings (ALLEVYN LIFE SACRUM) PADS Apply mupirocin ointment to ulcer daily for 7 days, then apply pad no top. After 7 days, apply pad only. 30 each 1   No current facility-administered medications on file prior to visit.    No Known Allergies  Past  Medical History:  Diagnosis Date   Anxiety    BCC (basal cell carcinoma) 11/27/2021   left posterior neck, EDC 12/18/2021   Diabetes mellitus    GERD (gastroesophageal reflux disease)    Hyperlipidemia    Hypertension    Irregular heartbeat    Dr. (his) stated that it was "nothing to worry about."   Peptic ulcer disease    Renal cyst    Rosacea    Squamous cell carcinoma in situ (SCCIS) 06/26/2022   left elbow sccis scheduled for [facility] 08/14/2022    Past Surgical History:  Procedure Laterality Date   CHOLECYSTECTOMY     KNEE ARTHROSCOPY     left   PARTIAL GASTRECTOMY  1974    Family History  Problem Relation Age of Onset   Cancer Brother        prostate   Colon cancer Brother    Kidney failure Brother    Cancer Sister    Heart disease Brother    Cancer Brother        prostate cancer   Cancer Brother        prostate    Social History   Socioeconomic History   Marital status: Widowed    Spouse name: Not on file   Number of children: 2   Years of education: Not on file   Highest education level: Not on file  Occupational History   Occupation: RETIRED, Programmer, systems: RETIRED  Tobacco Use   Smoking status: Never    Passive exposure: Never   Smokeless tobacco: Never  Substance and Sexual Activity   Alcohol use: No   Drug use: Not on file   Sexual activity: Not on file  Other Topics Concern   Not on file  Social History Narrative   Now has living will   Son is health care POA--no alternate   Would accept resuscitation attempts   No tube feeds if cognitively unaware   Social Determinants of Health   Financial Resource Strain: Low Risk  (04/03/2020)   Overall Financial Resource Strain (CARDIA)    Difficulty of Paying Living Expenses: Not hard at all  Food Insecurity: Not on file  Transportation Needs: Not on file  Physical Activity: Not on file  Stress: Not on file  Social Connections: Not on file  Intimate Partner Violence: Not on file    Review of Systems Appetite is good Weight fairly stable Wears seat belt Teeth okay---keeps up with dentist Still uses cortisone for psoriasis Bowels move okay--no blood Voids well---stream pretty good. Stable nocturia x 2. Empties fine No sig back or joint pains---will be sore after digging, etc    Objective:   Physical Exam Constitutional:      Appearance: Normal appearance.  HENT:     Mouth/Throat:     Comments: No lesions Eyes:     Conjunctiva/sclera: Conjunctivae normal.  Pupils: Pupils are equal, round, and reactive to light.  Cardiovascular:     Rate and Rhythm: Normal rate and regular rhythm.     Pulses: Normal pulses.     Heart sounds: No murmur heard.    No gallop.  Pulmonary:     Effort: Pulmonary effort is normal.     Breath sounds: Normal breath sounds. No wheezing or rales.  Abdominal:     Palpations: Abdomen is soft.     Tenderness: There is no abdominal tenderness.  Musculoskeletal:     Cervical back: Neck supple.     Comments: 1+ pitting edema in feet/ankles  Lymphadenopathy:     Cervical: No cervical adenopathy.  Skin:    Findings: No lesion or rash.     Comments: No foot lesions  Neurological:     General: No focal deficit present.     Mental Status: He is alert and oriented to person, place, and time.     Comments: Mini-cog--- clock good. Recall 3/3 after brief time--but forgot them after a minute Decreased sensation in feet  Psychiatric:        Mood and Affect: Mood normal.        Behavior: Behavior normal.            Assessment & Plan:

## 2022-09-27 NOTE — Assessment & Plan Note (Signed)
BP Readings from Last 3 Encounters:  09/27/22 136/80  03/26/22 122/82  09/25/21 138/82   Controlled on lisinopril 40 and verapamil 240 If the swelling gets any worse--would try changing the verapamil

## 2022-09-27 NOTE — Assessment & Plan Note (Signed)
I have personally reviewed the Medicare Annual Wellness questionnaire and have noted 1. The patient's medical and social history 2. Their use of alcohol, tobacco or illicit drugs 3. Their current medications and supplements 4. The patient's functional ability including ADL's, fall risks, home safety risks and hearing or visual             impairment. 5. Diet and physical activities 6. Evidence for depression or mood disorders  The patients weight, height, BMI and visual acuity have been recorded in the chart I have made referrals, counseling and provided education to the patient based review of the above and I have provided the pt with a written personalized care plan for preventive services.  I have provided you with a copy of your personalized plan for preventive services. Please take the time to review along with your updated medication list.  Done with cancer screening Discussed fitness Td at the pharmacy Already had flu and updated COVID vaccines

## 2022-09-27 NOTE — Progress Notes (Signed)
Hearing Screening - Comments:: Passed whisper test Vision Screening - Comments:: November 2023  

## 2022-09-27 NOTE — Assessment & Plan Note (Signed)
Has had cognitive problems since his fall--but not progressive Only functional issue is not being able [place] the [place]

## 2022-10-01 ENCOUNTER — Other Ambulatory Visit: Payer: Self-pay | Admitting: Internal Medicine

## 2022-10-01 DIAGNOSIS — F39 Unspecified mood [affective] disorder: Secondary | ICD-10-CM

## 2022-10-01 NOTE — Telephone Encounter (Signed)
Last filled 08-29-22 #60 Last OV 09-27-22 Next OV 03-31-23 [place]

## 2022-10-02 ENCOUNTER — Other Ambulatory Visit: Payer: Self-pay | Admitting: Internal Medicine

## 2022-10-15 ENCOUNTER — Other Ambulatory Visit: Payer: Self-pay | Admitting: Internal Medicine

## 2022-10-15 DIAGNOSIS — F39 Unspecified mood [affective] disorder: Secondary | ICD-10-CM

## 2022-10-29 ENCOUNTER — Encounter: Payer: Self-pay | Admitting: Internal Medicine

## 2022-10-29 ENCOUNTER — Ambulatory Visit (INDEPENDENT_AMBULATORY_CARE_PROVIDER_SITE_OTHER): Payer: PPO | Admitting: Internal Medicine

## 2022-10-29 ENCOUNTER — Telehealth: Payer: Self-pay

## 2022-10-29 VITALS — BP 156/84 | HR 87 | Temp 97.3°F | Ht 66.25 in | Wt 184.0 lb

## 2022-10-29 DIAGNOSIS — R300 Dysuria: Secondary | ICD-10-CM | POA: Diagnosis not present

## 2022-10-29 LAB — POC URINALSYSI DIPSTICK (AUTOMATED)
Glucose, UA: NEGATIVE
Nitrite, UA: POSITIVE
Protein, UA: POSITIVE — AB
Spec Grav, UA: >=1.03 — AB (ref 1.010–1.025)
Urobilinogen, UA: 4 E.U./dL — AB
pH, UA: 5 (ref 5.0–8.0)

## 2022-10-29 MED ORDER — SULFAMETHOXAZOLE-TRIMETHOPRIM 800-160 MG PO TABS: 1.0000 | ORAL_TABLET | Freq: Two times a day (BID) | ORAL | 0 refills | Status: DC

## 2022-10-29 NOTE — Assessment & Plan Note (Signed)
And gross hematuria Urinalysis does show 3+ leuks Most likely having a bladder infection Known renal cyst--could be related to that also Will send culture Septra DS 1 bid for 7 days If the hematuria persists, will need CT and urology evaluation

## 2022-10-29 NOTE — Progress Notes (Signed)
Subjective:    Patient ID: Patrick [name], male    DOB: 12/23/1935, 87 y.o.   MRN: [id number]  HPI Here due to blood in urine and pain  Had normal urine last night---this morning it was bloody Has stinging with peeing also No flank pain, etc and no history of kidney stone  No fever, chills or shakes No abdominal pain  Generally voids okay--no trouble with emptying Nocturia x 3--stable  No recent travel No trauma or falls  Current Outpatient Medications on File Prior to Visit  Medication Sig Dispense Refill   ALPRAZolam (XANAX) 1 MG tablet TAKE 1 TABLET BY MOUTH TWICE (2) DAILY AS NEEDED 60 tablet 0   Ascorbic Acid (VITAMIN C) 1000 MG tablet Take 1,000 mg by mouth daily.     aspirin 81 MG tablet Take 81 mg by mouth daily.     betamethasone dipropionate (DIPROLENE) 0.05 % ointment Apply to aa's psoriasis QD-BID PRN. Avoid the face, groin, and axilla. 50 g 2   Blood Glucose Monitoring Suppl (ONE TOUCH ULTRA 2) w/Device KIT SMARTSIG:1 Each Via Meter As Directed     cholecalciferol (VITAMIN D) 1000 units tablet Take 1,000 Units by mouth daily.     fish oil-omega-3 fatty acids 1000 MG capsule Take 2 g by mouth daily.     gabapentin (NEURONTIN) 100 MG capsule TAKE 1 OR 2 CAPSULES BY MOUTH AT BEDTIME 60 capsule 11   glimepiride (AMARYL) 4 MG tablet TAKE 1 TABLET BY MOUTH TWICE A DAY FOR DIABETES 200 tablet 3   hydrochlorothiazide (HYDRODIURIL) 25 MG tablet TAKE 1 TABLET BY MOUTH ONCE DAILY 90 tablet 3   ketoconazole (NIZORAL) 2 % cream Apply twice daily as needed 60 g 1   ketoconazole (NIZORAL) 2 % shampoo apply three times per week, massage into scalp and leave in for 10 minutes before rinsing out 120 mL 11   latanoprost (XALATAN) 0.005 % ophthalmic solution Place 1 drop into both eyes Daily.     lisinopril (ZESTRIL) 40 MG tablet TAKE 1 TABLET BY MOUTH EVERY MORNING FORBLOOD PRESSURE 90 tablet 3   metFORMIN (GLUCOPHAGE) 1000 MG tablet TAKE 1 TABLET BY MOUTH BEFORE BREAKFAST AND THEN  1 TABLET BEFORE DINNER AND 1/2 TABLET AT BEDTIME FOR DIABETES. 260 tablet 3   mupirocin ointment (BACTROBAN) 2 % APPLY 1 APPLICATION TOPICALLY 3 TIMES DAILY. APPLY TO OPEN AREAS BUTTOCKS CREASE AS DIRECTED 22 g 2   ONETOUCH DELICA LANCETS 94W MISC 1 Units by Does not apply route as needed. Use one to obtain blood sugar. Dx  E11.49 100 each 3   ONETOUCH ULTRA test strip USE TO CHECK BLOOD SUGAR TWICE DAILY DX E11.49 100 each 3   verapamil (CALAN-SR) 240 MG CR tablet TAKE 1 TABLET BY MOUTH EVERY MORNING FORBLOOD PRESSURE 90 tablet 3   vitamin E 400 UNIT capsule Take 400 Units by mouth daily.     Wound Dressings (ALLEVYN LIFE SACRUM) PADS Apply mupirocin ointment to ulcer daily for 7 days, then apply pad no top. After 7 days, apply pad only. 30 each 1   No current facility-administered medications on file prior to visit.    No Known Allergies  Past Medical History:  Diagnosis Date   Anxiety    BCC (basal cell carcinoma) 11/27/2021   left posterior neck, EDC 12/18/2021   Diabetes mellitus    GERD (gastroesophageal reflux disease)    Hyperlipidemia    Hypertension    Irregular heartbeat    Dr. (his) stated that  it was "nothing to worry about."   Peptic ulcer disease    Renal cyst    Rosacea    Squamous cell carcinoma in situ (SCCIS) 06/26/2022   left elbow sccis scheduled for [facility] 08/14/2022    Past Surgical History:  Procedure Laterality Date   CHOLECYSTECTOMY     KNEE ARTHROSCOPY     left   PARTIAL GASTRECTOMY  1974    Family History  Problem Relation Age of Onset   Cancer Brother        prostate   Colon cancer Brother    Kidney failure Brother    Cancer Sister    Heart disease Brother    Cancer Brother        prostate cancer   Cancer Brother        prostate    Social History   Socioeconomic History   Marital status: Widowed    Spouse name: Not on file   Number of children: 2   Years of education: Not on file   Highest education level: Not on file   Occupational History   Occupation: RETIRED, Programmer, systems: RETIRED  Tobacco Use   Smoking status: Never    Passive exposure: Never   Smokeless tobacco: Never  Substance and Sexual Activity   Alcohol use: No   Drug use: Not on file   Sexual activity: Not on file  Other Topics Concern   Not on file  Social History Narrative   Now has living will   Son is health care POA--no alternate   Would accept resuscitation attempts   No tube feeds if cognitively unaware   Social Determinants of Health   Financial Resource Strain: Low Risk  (04/03/2020)   Overall Financial Resource Strain (CARDIA)    Difficulty of Paying Living Expenses: Not hard at all  Food Insecurity: Not on file  Transportation Needs: Not on file  Physical Activity: Not on file  Stress: Not on file  Social Connections: Not on file  Intimate Partner Violence: Not on file   Review of Systems No N/V Eating okay No sex in the past year    Objective:   Physical Exam Constitutional:      Appearance: Normal appearance.  Abdominal:     Palpations: Abdomen is soft.     Tenderness: There is no right CVA tenderness or left CVA tenderness.     Comments: Mild suprapubic tenderness---?some dullness  Neurological:     Mental Status: He is alert.            Assessment & Plan:

## 2022-10-29 NOTE — Telephone Encounter (Signed)
I spoke with pt; pt said having frequency and burning upon urination and this morning started seeing a lot of blood when urinates. No fever and no abd pain. UC & ED precautions given and pt voiced understanding. Dr Silvio Pate said he would work pt in at end of morning; pt coming now and pt very appreciative. Sending note to Dr Silvio Pate and Silvio Pate pool.

## 2022-10-29 NOTE — Telephone Encounter (Signed)
[place] Day - Client TELEPHONE ADVICE RECORD [facility]urse Patient Name: Patrick [name] Gender: Male DOB: 06-12-1936 Age: 87 Y 2 M 6 D Return Phone Number: [phone] (Primary) Address: City/ State/ Zip: [place]  [address] Client [name] [facility] - Client Client Site [place] - Day Provider Viviana Simpler- MD Contact Type Call Who Is Calling Patient / Member / Family / Caregiver Call Type Triage / Clinical Relationship To Patient Self Return Phone Number [phone] (Primary) Chief Complaint Urine, Blood In Reason for Call Symptomatic / Request for [place] states he has blood in his urine. Translation No Nurse Assessment Nurse: Claiborne Billings, RN, Kim Date/Time (Eastern Time): 10/29/2022 9:43:23 AM Confirm and document reason for call. If symptomatic, describe symptoms. ---Caller states he has had blood in his urine since this morning. No fever, no abdominal pain. Does the patient have any new or worsening symptoms? ---Yes Will a triage be completed? ---Yes Related visit to physician within the last 2 weeks? ---No Does the PT have any chronic conditions? (i.e. diabetes, asthma, this includes High risk factors for pregnancy, etc.) ---Yes List chronic conditions. ---Diabetes, HTN Is this a behavioral health or substance abuse call? ---No Guidelines Guideline Title Affirmed Question Affirmed Notes Nurse Date/Time [name] Time) Urine - Blood In Pain or burning with passing urine Claiborne Billings, RN, Kim 10/29/2022 9:44:29 AM Disp. Time [name] Time) Disposition Final User 10/29/2022 9:46:24 AM See PCP within 24 Hours Yes Claiborne Billings RN, Kim Final Disposition 10/29/2022 9:46:24 AM See PCP within 24 Hours Yes Claiborne Billings, RN, Maudie Mercury PLEASE NOTE: All timestamps contained within this report are represented as [place] Standard Time. CONFIDENTIALTY NOTICE: This fax transmission is intended  only for the addressee. It contains information that is legally privileged, confidential or otherwise protected from use or disclosure. If you are not the intended recipient, you are strictly prohibited from reviewing, disclosing, copying using or disseminating any of this information or taking any action in reliance on or regarding this information. If you have received this fax in error, please notify us immediately by telephone so that we can arrange for its return to [place]. Phone: [phone], Toll-Free: [phone], Fax: [phone] Page: 2 of 2 Call Id: [id number] [place] Disagree/Comply Comply Caller Understands Yes PreDisposition Did not know what to do Care Advice Given Per Guideline SEE PCP WITHIN 24 HOURS: * IF OFFICE WILL BE OPEN: You need to be examined within the next 24 hours. Call your doctor (or NP/PA) when the office opens and make an appointment. DRINK EXTRA FLUIDS: * Drink extra fluids. * Drink 8 to 10 cups (1,800 to 2,400 ml) of liquids a day. PAIN MEDICINES: * ACETAMINOPHEN - REGULAR STRENGTH TYLENOL: Take 650 mg (two 325 mg pills) by mouth every 4 to 6 hours as needed. Each Regular Strength Tylenol pill has 325 mg of acetaminophen. The most you should take is 10 pills a day (3,250 mg total). Note: In [place], the maximum is 12 pills a day (3,900 mg total). * ACETAMINOPHEN - EXTRA STRENGTH TYLENOL: Take 1,000 mg (two 500 mg pills) every 6 to 8 hours as needed. Each Extra Strength Tylenol pill has 500 mg of acetaminophen. The most you should take is 6 pills a day (3,000 mg total). Note: In [place], the maximum is 8 pills a day (4,000 mg total). CALL BACK IF: * Fever occurs * You become worse CARE ADVICE given per Urine, Blood In (Adult) guideline. Referrals REFERRED TO PCP OFFIC

## 2022-10-30 ENCOUNTER — Ambulatory Visit: Payer: PPO | Admitting: Primary Care

## 2022-10-30 ENCOUNTER — Other Ambulatory Visit: Payer: Self-pay | Admitting: Internal Medicine

## 2022-10-30 DIAGNOSIS — F39 Unspecified mood [affective] disorder: Secondary | ICD-10-CM

## 2022-10-30 LAB — URINE CULTURE
MICRO NUMBER:: 14377224
SPECIMEN QUALITY:: ADEQUATE

## 2022-10-30 NOTE — Telephone Encounter (Signed)
Last filled 10-01-22 #60 Last OV 10-29-22 Next OV 03-31-23 Patrick [name]

## 2022-10-31 ENCOUNTER — Ambulatory Visit: Payer: PPO | Admitting: Internal Medicine

## 2022-11-27 ENCOUNTER — Other Ambulatory Visit: Payer: Self-pay | Admitting: Internal Medicine

## 2022-11-27 DIAGNOSIS — F39 Unspecified mood [affective] disorder: Secondary | ICD-10-CM

## 2022-11-27 NOTE — Telephone Encounter (Signed)
Last filled 10-31-22 #60 Last OV 10-29-22 Next OV 03-31-23 [place]

## 2022-12-24 ENCOUNTER — Other Ambulatory Visit: Payer: Self-pay | Admitting: Internal Medicine

## 2022-12-24 DIAGNOSIS — F39 Unspecified mood [affective] disorder: Secondary | ICD-10-CM

## 2022-12-24 NOTE — Telephone Encounter (Signed)
Prescription Request  12/24/2022  Is this a "Controlled Substance" medicine? No  LOV: 10/29/2022  What is the name of the medication or equipment? ALPRAZolam Duanne Moron) 1 MG tablet   Have you contacted your pharmacy to request a refill? Yes   Which pharmacy would you like this sent to?  [facility], [address] [place] [address] Phone: [phone] Fax: [phone]    Patient notified that their request is being sent to the clinical staff for review and that they should receive a response within 2 business days.   Please advise at Mobile There is no such number on file (mobile).

## 2022-12-25 ENCOUNTER — Telehealth: Payer: Self-pay | Admitting: Internal Medicine

## 2022-12-25 MED ORDER — ALPRAZOLAM 1 MG PO TABS: ORAL_TABLET | ORAL | 0 refills | Status: DC

## 2022-12-25 NOTE — Telephone Encounter (Signed)
Refill left on VM at pharmacy.

## 2022-12-25 NOTE — Telephone Encounter (Signed)
Not sure if they are back up over night. I can call it in if it fails.

## 2022-12-25 NOTE — Telephone Encounter (Signed)
Prescription Request  12/25/2022   LOV: 10/29/2022  What is the name of the medication or equipment? ALPRAZolam Duanne Moron) 1 MG tablet   Have you contacted your pharmacy to request a refill? Yes   Which pharmacy would you like this sent to?  [facility], [address] [place] [address] Phone: [phone] Fax: [phone]    Patient notified that their request is being sent to the clinical staff for review and that they should receive a response within 2 business days.   Please advise at [facility] [phone]   Please verbally call into office ,due to fax still being down

## 2022-12-25 NOTE — Addendum Note (Signed)
Addended by: Viviana Simpler I on: 12/25/2022 12:42 PM   Modules accepted: Orders

## 2022-12-25 NOTE — Addendum Note (Signed)
Addended by: Pilar Grammes on: 12/25/2022 08:08 AM   Modules accepted: Orders

## 2022-12-25 NOTE — Telephone Encounter (Signed)
See previous message. This has already been taken care of.

## 2023-01-15 ENCOUNTER — Encounter: Payer: Self-pay | Admitting: Dermatology

## 2023-01-15 ENCOUNTER — Ambulatory Visit: Payer: PPO | Admitting: Dermatology

## 2023-01-15 VITALS — BP 138/80 | HR 76

## 2023-01-15 DIAGNOSIS — L814 Other melanin hyperpigmentation: Secondary | ICD-10-CM

## 2023-01-15 DIAGNOSIS — L821 Other seborrheic keratosis: Secondary | ICD-10-CM

## 2023-01-15 DIAGNOSIS — L578 Other skin changes due to chronic exposure to nonionizing radiation: Secondary | ICD-10-CM | POA: Diagnosis not present

## 2023-01-15 DIAGNOSIS — D229 Melanocytic nevi, unspecified: Secondary | ICD-10-CM

## 2023-01-15 DIAGNOSIS — D692 Other nonthrombocytopenic purpura: Secondary | ICD-10-CM | POA: Diagnosis not present

## 2023-01-15 DIAGNOSIS — L570 Actinic keratosis: Secondary | ICD-10-CM | POA: Diagnosis not present

## 2023-01-15 DIAGNOSIS — Z1283 Encounter for screening for malignant neoplasm of skin: Secondary | ICD-10-CM

## 2023-01-15 DIAGNOSIS — Z86007 Personal history of in-situ neoplasm of skin: Secondary | ICD-10-CM | POA: Diagnosis not present

## 2023-01-15 DIAGNOSIS — Z85828 Personal history of other malignant neoplasm of skin: Secondary | ICD-10-CM

## 2023-01-15 NOTE — Patient Instructions (Addendum)
Scalp: Apply ketoconazole shampoo apply three times per week, massage into scalp and leave in for 8-10 minutes before rinsing out. This can be followed by conditioner or shampoo and conditioner of your choice.   Alternate Ketoconazole shampoo with Neutrogena T/Sal shampoo.    Bruising on arms:  Can use OTC arnica containing moisturizer such as Dermend Bruise Formula if desired   Dry skin on legs: Recommend using AmLactin lotion once or twice daily to legs.   Cryotherapy Aftercare  Wash gently with soap and water everyday.   Apply Vaseline daily until healed.     Recommend daily broad spectrum sunscreen SPF 30+ to sun-exposed areas, reapply every 2 hours as needed. Call for new or changing lesions.  Staying in the shade or wearing long sleeves, sun glasses (UVA+UVB protection) and wide brim hats (4-inch brim around the entire circumference of the hat) are also recommended for sun protection.    Melanoma ABCDEs  Melanoma is the most dangerous type of skin cancer, and is the leading cause of death from skin disease.  You are more likely to develop melanoma if you: Have light-colored skin, light-colored eyes, or red or blond hair Spend a lot of time in the sun Tan regularly, either outdoors or in a tanning bed Have had blistering sunburns, especially during childhood Have a close family member who has had a melanoma Have atypical moles or large birthmarks  Early detection of melanoma is key since treatment is typically straightforward and cure rates are extremely high if we catch it early.   The first sign of melanoma is often a change in a mole or a new dark spot.  The ABCDE system is a way of remembering the signs of melanoma.  A for asymmetry:  The two halves do not match. B for border:  The edges of the growth are irregular. C for color:  A mixture of colors are present instead of an even brown color. D for diameter:  Melanomas are usually (but not always) greater than 21mm -  the size of a pencil eraser. E for evolution:  The spot keeps changing in size, shape, and color.  Please check your skin once per month between visits. You can use a small mirror in front and a large mirror behind you to keep an eye on the back side or your body.   If you see any new or changing lesions before your next follow-up, please call to schedule a visit.  Please continue daily skin protection including broad spectrum sunscreen SPF 30+ to sun-exposed areas, reapplying every 2 hours as needed when you're outdoors.   Staying in the shade or wearing long sleeves, sun glasses (UVA+UVB protection) and wide brim hats (4-inch brim around the entire circumference of the hat) are also recommended for sun protection.    Due to recent changes in healthcare laws, you may see results of your pathology and/or laboratory studies on MyChart before the doctors have had a chance to review them. We understand that in some cases there may be results that are confusing or concerning to you. Please understand that not all results are received at the same time and often the doctors may need to interpret multiple results in order to provide you with the best plan of care or course of treatment. Therefore, we ask that you please give [place] 2 business days to thoroughly review all your results before contacting the office for clarification. Should we see a critical lab result, you will be contacted  sooner.   If You Need Anything After Your Visit  If you have any questions or concerns for your doctor, please call our main line at [phone] and press option 4 to reach your doctor's medical assistant. If no one answers, please leave a voicemail as directed and we will return your call as soon as possible. Messages left after 4 pm will be answered the following business day.   You may also send [place] a message via [place]. We typically respond to MyChart messages within 1-2 business days.  For prescription refills,  please ask your pharmacy to contact our office. Our fax number is [phone].  If you have an urgent issue when the clinic is closed that cannot wait until the next business day, you can page your doctor at the number below.    Please note that while we do our best to be available for urgent issues outside of office hours, we are not available 24/7.   If you have an urgent issue and are unable to reach [place], you may choose to seek medical care at your doctor's office, retail clinic, urgent care center, or emergency room.  If you have a medical emergency, please immediately call 911 or go to the emergency department.  Pager Numbers  - Dr. Nehemiah Massed: [phone]  - Dr. Laurence Ferrari: [phone]  - Dr. Nicole Kindred: [phone]  In the event of inclement weather, please call our main line at [phone] for an update on the status of any delays or closures.  Dermatology Medication Tips: Please keep the boxes that topical medications come in in order to help keep track of the instructions about where and how to use these. Pharmacies typically print the medication instructions only on the boxes and not directly on the medication tubes.   If your medication is too expensive, please contact our office at [phone] option 4 or send [place] a message through [place].   We are unable to tell what your co-pay for medications will be in advance as this is different depending on your insurance coverage. However, we may be able to find a substitute medication at lower cost or fill out paperwork to get insurance to cover a needed medication.   If a prior authorization is required to get your medication covered by your insurance company, please allow [place] 1-2 business days to complete this process.  Drug prices often vary depending on where the prescription is filled and some pharmacies may offer cheaper prices.  The website www.goodrx.com contains coupons for medications through different pharmacies. The prices  here do not account for what the cost may be with help from insurance (it may be cheaper with your insurance), but the website can give you the price if you did not use any insurance.  - You can print the associated coupon and take it with your prescription to the pharmacy.  - You may also stop by our office during regular business hours and pick up a GoodRx coupon card.  - If you need your prescription sent electronically to a different pharmacy, notify our office through [facility] or by phone at [phone] option 4.     Si Usted Necesita Algo Despus de Su Visita  Tambin puede enviarnos un mensaje a travs de Pharmacist, community. Por lo general respondemos a los mensajes de MyChart en el transcurso de 1 a 2 das hbiles.  Para renovar recetas, por favor pida a su farmacia que se ponga en contacto con nuestra oficina. Harland Dingwall de fax es el  [phone].  Si tiene un asunto urgente cuando la clnica est cerrada y que no puede esperar hasta el siguiente da hbil, puede llamar/localizar a su doctor(a) al nmero que aparece a continuacin.   Por favor, tenga en cuenta que aunque hacemos todo lo posible para estar disponibles para asuntos urgentes fuera del horario de [place], no estamos disponibles las 24 horas del da, los 7 das de la [place].   Si tiene un problema urgente y no puede comunicarse con nosotros, puede optar por buscar atencin mdica  en el consultorio de su doctor(a), en una clnica privada, en un centro de atencin urgente o en una sala de emergencias.  Si tiene Engineering geologist, por favor llame inmediatamente al 911 o vaya a la sala de emergencias.  Nmeros de bper  - Dr. Nehemiah Massed: [phone]  - Dra. Moye: [phone]  - Dra. Nicole Kindred: [phone]  En caso de inclemencias del [place], por favor llame a Johnsie Kindred principal al [phone] para una actualizacin sobre el [place] de cualquier retraso o cierre.  Consejos para la medicacin en  dermatologa: Por favor, guarde las cajas en las que vienen los medicamentos de uso tpico para ayudarle a seguir las instrucciones sobre dnde y cmo usarlos. Las farmacias generalmente imprimen las instrucciones del medicamento slo en las cajas y no directamente en los tubos del [place].   Si su medicamento es muy caro, por favor, pngase en contacto con Zigmund Daniel llamando al [phone] y presione la opcin 4 o envenos un mensaje a travs de Pharmacist, community.   No podemos decirle cul ser su copago por los medicamentos por adelantado ya que esto es diferente dependiendo de la cobertura de su seguro. Sin embargo, es posible que podamos encontrar un medicamento sustituto a Electrical engineer un formulario para que el seguro cubra el medicamento que se considera necesario.   Si se requiere una autorizacin previa para que su compaa de seguros [place] su medicamento, por favor permtanos de 1 a 2 das hbiles para completar este proceso.  Los precios de los medicamentos varan con frecuencia dependiendo del Environmental consultant de dnde se surte la receta y alguna farmacias pueden ofrecer precios ms baratos.  El sitio web www.goodrx.com tiene cupones para medicamentos de Airline pilot. Los precios aqu no tienen en cuenta lo que podra costar con la ayuda del seguro (puede ser ms barato con su seguro), pero el sitio web puede darle el precio si no utiliz Research scientist (physical sciences).  - Puede imprimir el cupn correspondiente y llevarlo con su receta a la farmacia.  - Tambin puede pasar por nuestra oficina durante el horario de atencin regular y Charity fundraiser una tarjeta de cupones de GoodRx.  - Si necesita que su receta se enve electrnicamente a una farmacia diferente, informe a nuestra oficina a travs de MyChart de [place] o por telfono llamando al [phone] y presione la opcin 4.

## 2023-01-15 NOTE — Progress Notes (Signed)
Follow-Up Visit   Subjective  Patrick [name] is a 87 y.o. male who presents for the following: Skin Cancer Screening and Full Body Skin Exam. HxBCC, HxSCC  The patient presents for Total-Body Skin Exam (TBSE) for skin cancer screening and mole check. The patient has spots, moles and lesions to be evaluated, some may be new or changing and the patient has concerns that these could be cancer.  The following portions of the chart were reviewed this encounter and updated as appropriate: medications, allergies, medical history  Review of Systems:  No other skin or systemic complaints except as noted in HPI or Assessment and Plan.  Objective  Well appearing patient in no apparent distress; mood and affect are within normal limits.  A full examination was performed including scalp, head, eyes, ears, nose, lips, neck, chest, axillae, abdomen, back, buttocks, bilateral upper extremities, bilateral lower extremities, hands, feet, fingers, toes, fingernails, and toenails. All findings within normal limits unless otherwise noted below.      Assessment & Plan   History of Squamous Cell Carcinoma in Situ of the Skin - No evidence of recurrence today - Recommend regular full body skin exams - Recommend daily broad spectrum sunscreen SPF 30+ to sun-exposed areas, reapply every 2 hours as needed.  - Call if any new or changing lesions are noted between office visits   History of Basal Cell Carcinoma of the Skin - No evidence of recurrence today - Recommend regular full body skin exams - Recommend daily broad spectrum sunscreen SPF 30+ to sun-exposed areas, reapply every 2 hours as needed.  - Call if any new or changing lesions are noted between office visits    Lentigines, Seborrheic Keratoses, Hemangiomas - Benign normal skin lesions - Benign-appearing - Call for any changes  Melanocytic Nevi - Tan-brown and/or pink-flesh-colored symmetric macules and papules - Benign appearing on exam  today - Observation - Call clinic for new or changing moles - Recommend daily use of broad spectrum spf 30+ sunscreen to sun-exposed areas.   Actinic Damage - Chronic condition, secondary to cumulative UV/sun exposure - diffuse scaly erythematous macules with underlying dyspigmentation - Recommend daily broad spectrum sunscreen SPF 30+ to sun-exposed areas, reapply every 2 hours as needed.  - Staying in the shade or wearing long sleeves, sun glasses (UVA+UVB protection) and wide brim hats (4-inch brim around the entire circumference of the hat) are also recommended for sun protection.  - Call for new or changing lesions.  ACTINIC KERATOSIS  Actinic keratoses are precancerous spots that appear secondary to cumulative UV radiation exposure/sun exposure over time. They are chronic with expected duration over 1 year. A portion of actinic keratoses will progress to squamous cell carcinoma of the skin. It is not possible to reliably predict which spots will progress to skin cancer and so treatment is recommended to prevent development of skin cancer.  Recommend daily broad spectrum sunscreen SPF 30+ to sun-exposed areas, reapply every 2 hours as needed.  Recommend staying in the shade or wearing long sleeves, sun glasses (UVA+UVB protection) and wide brim hats (4-inch brim around the entire circumference of the hat). Call for new or changing lesions.  Prior to procedure, discussed risks of blister formation, small wound, skin dyspigmentation, or rare scar following cryotherapy. Recommend Vaseline ointment to treated areas while healing.  Destruction Procedure Note Destruction method: cryotherapy   Informed consent: discussed and consent obtained   Lesion destroyed using liquid nitrogen: Yes   Cryotherapy cycles:  2 Outcome: patient  tolerated procedure well with no complications   Post-procedure details: wound care instructions given   Locations: left helix x1 (hypertrophic. Consider biopsy if  not resolved), nasal dorsum x1 # of Lesions Treated: 2  SEBORRHEIC DERMATITIS Scalp Exam: Pink patches with greasy scale.  Chronic and persistent condition with duration or expected duration over one year. Condition is symptomatic/ bothersome to patient. Not currently at goal.   Seborrheic Dermatitis  -  is a chronic persistent rash characterized by pinkness and scaling most commonly of the mid face but also can occur on the scalp (dandruff), ears; mid chest, mid back and groin.  It tends to be exacerbated by stress and cooler weather.  People who have neurologic disease may experience new onset or exacerbation of existing seborrheic dermatitis.  The condition is not curable but treatable and can be controlled.  Treatment Plan: Apply ketoconazole shampoo apply three times per week, massage into scalp and leave in for 8-10 minutes before rinsing out. This can be followed by conditioner or shampoo and conditioner of your choice.   Alternate Ketoconazole shampoo with Neutrogena T/Sal shampoo   Purpura - Chronic; persistent and recurrent.  Treatable, but not curable. - Violaceous macules and patches - Benign - Related to trauma, age, sun damage and/or use of blood thinners, chronic use of topical and/or oral steroids - Observe - Can use OTC arnica containing moisturizer such as Dermend Bruise Formula if desired - Call for worsening or other concerns   Skin cancer screening performed today.  Return for AK Follow Up in 3-4 months.  I, Emelia Salisbury, CMA, am acting as scribe for Forest Gleason, MD.   Documentation: I have reviewed the above documentation for accuracy and completeness, and I agree with the above.  Forest Gleason, MD

## 2023-01-23 ENCOUNTER — Other Ambulatory Visit: Payer: Self-pay | Admitting: Internal Medicine

## 2023-01-23 DIAGNOSIS — F39 Unspecified mood [affective] disorder: Secondary | ICD-10-CM

## 2023-01-23 NOTE — Telephone Encounter (Signed)
Last filled 12-24-22 #60 Last OV 10-29-22 Next OV 03-31-23 [place]

## 2023-02-13 ENCOUNTER — Other Ambulatory Visit: Payer: Self-pay | Admitting: Internal Medicine

## 2023-02-24 ENCOUNTER — Other Ambulatory Visit: Payer: Self-pay | Admitting: Internal Medicine

## 2023-02-24 DIAGNOSIS — F39 Unspecified mood [affective] disorder: Secondary | ICD-10-CM

## 2023-02-24 NOTE — Telephone Encounter (Signed)
Last filled 01-24-23 #60 Last OV 10-29-22 Next OV 03-31-23 [facility]

## 2023-03-05 DIAGNOSIS — E119 Type 2 diabetes mellitus without complications: Secondary | ICD-10-CM | POA: Diagnosis not present

## 2023-03-05 DIAGNOSIS — H2513 Age-related nuclear cataract, bilateral: Secondary | ICD-10-CM | POA: Diagnosis not present

## 2023-03-05 DIAGNOSIS — H401131 Primary open-angle glaucoma, bilateral, mild stage: Secondary | ICD-10-CM | POA: Diagnosis not present

## 2023-03-22 ENCOUNTER — Other Ambulatory Visit: Payer: Self-pay | Admitting: Internal Medicine

## 2023-03-22 DIAGNOSIS — F39 Unspecified mood [affective] disorder: Secondary | ICD-10-CM

## 2023-03-24 NOTE — Telephone Encounter (Signed)
Last filled 02-24-23 #60 Last OV 10-29-22 Next OV 03-31-23 [facility]

## 2023-03-31 ENCOUNTER — Ambulatory Visit: Payer: PPO | Admitting: Internal Medicine

## 2023-04-01 ENCOUNTER — Ambulatory Visit: Payer: PPO | Admitting: Internal Medicine

## 2023-04-07 ENCOUNTER — Encounter: Payer: Self-pay | Admitting: Internal Medicine

## 2023-04-07 ENCOUNTER — Ambulatory Visit (INDEPENDENT_AMBULATORY_CARE_PROVIDER_SITE_OTHER): Payer: PPO | Admitting: Internal Medicine

## 2023-04-07 VITALS — BP 114/68 | HR 68 | Temp 97.8°F | Ht 66.25 in | Wt 184.0 lb

## 2023-04-07 DIAGNOSIS — I739 Peripheral vascular disease, unspecified: Secondary | ICD-10-CM | POA: Insufficient documentation

## 2023-04-07 DIAGNOSIS — E1142 Type 2 diabetes mellitus with diabetic polyneuropathy: Secondary | ICD-10-CM

## 2023-04-07 DIAGNOSIS — I10 Essential (primary) hypertension: Secondary | ICD-10-CM

## 2023-04-07 DIAGNOSIS — Z7984 Long term (current) use of oral hypoglycemic drugs: Secondary | ICD-10-CM

## 2023-04-07 LAB — POCT GLYCOSYLATED HEMOGLOBIN (HGB A1C): Hemoglobin A1C: 7.1 % — AB (ref 4.0–5.6)

## 2023-04-07 NOTE — Progress Notes (Signed)
Subjective:    Patient ID: Patrick [name], male    DOB: 1936/02/22, 87 y.o.   MRN: [id number]  HPI Here for follow up of diabetes and other chronic health issues  Having more trouble with his feet and legs Feels okay in AM---but worsens by noon Has to sit down--and they swell by evening Better with elevation Just pain early on---discussed neuropathy  Uses gabapentin at bedtime only Will use one mostly--but takes 2 if he has had a busy day  Checks sugars Variable AM sugars---often under 100, rarely over 130 No hypoglycemic reactions--unless he forgets to eat (will get shaky)  No chest pain or SOB No dizziness or syncope  Current Outpatient Medications on File Prior to Visit  Medication Sig Dispense Refill   ALPRAZolam (XANAX) 1 MG tablet TAKE ONE TABLET BY MOUTH TWICE (2) DAILY AS NEEDED 60 tablet 0   Ascorbic Acid (VITAMIN C) 1000 MG tablet Take 1,000 mg by mouth daily.     aspirin 81 MG tablet Take 81 mg by mouth daily.     betamethasone dipropionate (DIPROLENE) 0.05 % ointment Apply to aa's psoriasis QD-BID PRN. Avoid the face, groin, and axilla. 50 g 2   Blood Glucose Monitoring Suppl (ONE TOUCH ULTRA 2) w/Device KIT SMARTSIG:1 Each Via Meter As Directed     cholecalciferol (VITAMIN D) 1000 units tablet Take 1,000 Units by mouth daily.     fish oil-omega-3 fatty acids 1000 MG capsule Take 2 g by mouth daily.     gabapentin (NEURONTIN) 100 MG capsule TAKE 1 OR 2 CAPSULES BY MOUTH AT BEDTIME 60 capsule 11   glimepiride (AMARYL) 4 MG tablet TAKE 1 TABLET BY MOUTH TWICE A DAY FOR DIABETES 200 tablet 3   hydrochlorothiazide (HYDRODIURIL) 25 MG tablet TAKE 1 TABLET BY MOUTH ONCE DAILY 90 tablet 3   ketoconazole (NIZORAL) 2 % cream Apply twice daily as needed 60 g 1   ketoconazole (NIZORAL) 2 % shampoo apply three times per week, massage into scalp and leave in for 10 minutes before rinsing out 120 mL 11   latanoprost (XALATAN) 0.005 % ophthalmic solution Place 1 drop into both  eyes Daily.     lisinopril (ZESTRIL) 40 MG tablet TAKE ONE TABLET BY MOUTH EVERY MORNING FOR BLOOD PRESSURE 90 tablet 3   metFORMIN (GLUCOPHAGE) 1000 MG tablet TAKE 1 TABLET BY MOUTH BEFORE BREAKFAST AND THEN 1 TABLET BEFORE DINNER AND 1/2 TABLET AT BEDTIME FOR DIABETES. 260 tablet 3   mupirocin ointment (BACTROBAN) 2 % APPLY 1 APPLICATION TOPICALLY 3 TIMES DAILY. APPLY TO OPEN AREAS BUTTOCKS CREASE AS DIRECTED 22 g 2   ONETOUCH DELICA LANCETS 33G MISC 1 Units by Does not apply route as needed. Use one to obtain blood sugar. Dx  E11.49 100 each 3   ONETOUCH ULTRA test strip USE TO CHECK BLOOD SUGAR TWICE DAILY DX E11.49 100 each 3   verapamil (CALAN-SR) 240 MG CR tablet TAKE 1 TABLET BY MOUTH EVERY MORNING FORBLOOD PRESSURE 90 tablet 3   vitamin E 400 UNIT capsule Take 400 Units by mouth daily.     No current facility-administered medications on file prior to visit.    No Known Allergies  Past Medical History:  Diagnosis Date   Anxiety    BCC (basal cell carcinoma) 11/27/2021   left posterior neck, EDC 12/18/2021   Diabetes mellitus    GERD (gastroesophageal reflux disease)    Hyperlipidemia    Hypertension    Irregular heartbeat    Dr. Marland Kitchen  his) stated that it was "nothing to worry about."   Peptic ulcer disease    Renal cyst    Rosacea    Squamous cell carcinoma in situ (SCCIS) 06/26/2022   left elbow sccis scheduled for [facility] 08/14/2022    Past Surgical History:  Procedure Laterality Date   CHOLECYSTECTOMY     KNEE ARTHROSCOPY     left   PARTIAL GASTRECTOMY  1974    Family History  Problem Relation Age of Onset   Cancer Brother        prostate   Colon cancer Brother    Kidney failure Brother    Cancer Sister    Heart disease Brother    Cancer Brother        prostate cancer   Cancer Brother        prostate    Social History   Socioeconomic History   Marital status: Widowed    Spouse name: Not on file   Number of children: 2   Years of education: Not on file    Highest education level: Not on file  Occupational History   Occupation: RETIRED, Architect: RETIRED  Tobacco Use   Smoking status: Never    Passive exposure: Never   Smokeless tobacco: Never  Substance and Sexual Activity   Alcohol use: No   Drug use: Not on file   Sexual activity: Not on file  Other Topics Concern   Not on file  Social History Narrative   Now has living will   Son is health care POA--no alternate   Would accept resuscitation attempts   No tube feeds if cognitively unaware   Social Determinants of Health   Financial Resource Strain: Low Risk  (04/03/2020)   Overall Financial Resource Strain (CARDIA)    Difficulty of Paying Living Expenses: Not hard at all  Food Insecurity: Not on file  Transportation Needs: Not on file  Physical Activity: Not on file  Stress: Not on file  Social Connections: Not on file  Intimate Partner Violence: Not on file   Review of Systems Sleeps well Appetite is good Weight is up slightly No recurrence of urinary symptoms     Objective:   Physical Exam Constitutional:      Appearance: Normal appearance.  Cardiovascular:     Rate and Rhythm: Normal rate and regular rhythm.     Heart sounds: No murmur heard.    No gallop.     Comments: Faint pulse in right foot--1+ in left Pulmonary:     Effort: Pulmonary effort is normal.     Breath sounds: Normal breath sounds. No wheezing or rales.  Musculoskeletal:     Cervical back: Neck supple.     Comments: Trace ankle edema  Lymphadenopathy:     Cervical: No cervical adenopathy.  Skin:    Comments: Dependent rubor in feet No lesions  Neurological:     Mental Status: He is alert.  Psychiatric:        Mood and Affect: Mood normal.        Behavior: Behavior normal.            Assessment & Plan:

## 2023-04-07 NOTE — Assessment & Plan Note (Signed)
Lab Results  Component Value Date   HGBA1C 7.1 (A) 04/07/2023   Control improved Continues on glimepiride 4 mg bid and metformin 1000/1000/500 Gabapentin at bedtime--- 100-200

## 2023-04-07 NOTE — Assessment & Plan Note (Addendum)
BP Readings from Last 3 Encounters:  04/07/23 114/68  01/15/23 138/80  10/29/22 (!) 156/84   Good control on lisinopril 40 and HCTZ 25mg  and verapamil 240 If swelling worsens, would cut verapamil to 120 or 180

## 2023-04-07 NOTE — Assessment & Plan Note (Addendum)
Legs "give out" after working for a while---not clearly claudication (though decreased pulses) May be more neuropathy If exercise tolerance worsens---will set up further testing Did have venous testing in the past---[place]

## 2023-04-21 ENCOUNTER — Other Ambulatory Visit: Payer: Self-pay | Admitting: Internal Medicine

## 2023-04-21 DIAGNOSIS — F39 Unspecified mood [affective] disorder: Secondary | ICD-10-CM

## 2023-04-21 NOTE — Telephone Encounter (Signed)
Last filled 03-25-23 #60 Last OV 04-07-23 Next OV 10-09-23 [facility]

## 2023-05-15 ENCOUNTER — Ambulatory Visit: Payer: PPO | Admitting: Dermatology

## 2023-05-16 ENCOUNTER — Other Ambulatory Visit: Payer: Self-pay | Admitting: Internal Medicine

## 2023-05-19 ENCOUNTER — Other Ambulatory Visit: Payer: Self-pay | Admitting: Internal Medicine

## 2023-05-19 DIAGNOSIS — F39 Unspecified mood [affective] disorder: Secondary | ICD-10-CM

## 2023-05-19 NOTE — Telephone Encounter (Signed)
Last filled 04-21-23 #60 Last OV 04-07-23 Next OV 10-09-23 [facility]

## 2023-06-16 ENCOUNTER — Encounter: Payer: Self-pay | Admitting: Dermatology

## 2023-06-16 ENCOUNTER — Ambulatory Visit: Payer: PPO | Admitting: Dermatology

## 2023-06-16 ENCOUNTER — Other Ambulatory Visit: Payer: Self-pay | Admitting: Internal Medicine

## 2023-06-16 VITALS — BP 131/80 | HR 93

## 2023-06-16 DIAGNOSIS — Z872 Personal history of diseases of the skin and subcutaneous tissue: Secondary | ICD-10-CM | POA: Diagnosis not present

## 2023-06-16 DIAGNOSIS — L0201 Cutaneous abscess of face: Secondary | ICD-10-CM | POA: Diagnosis not present

## 2023-06-16 DIAGNOSIS — L578 Other skin changes due to chronic exposure to nonionizing radiation: Secondary | ICD-10-CM

## 2023-06-16 DIAGNOSIS — L219 Seborrheic dermatitis, unspecified: Secondary | ICD-10-CM

## 2023-06-16 DIAGNOSIS — W908XXA Exposure to other nonionizing radiation, initial encounter: Secondary | ICD-10-CM | POA: Diagnosis not present

## 2023-06-16 DIAGNOSIS — L0291 Cutaneous abscess, unspecified: Secondary | ICD-10-CM

## 2023-06-16 DIAGNOSIS — F39 Unspecified mood [affective] disorder: Secondary | ICD-10-CM

## 2023-06-16 DIAGNOSIS — L570 Actinic keratosis: Secondary | ICD-10-CM

## 2023-06-16 MED ORDER — CLOBETASOL PROPIONATE 0.05 % EX SOLN
1.0000 | Freq: Two times a day (BID) | CUTANEOUS | 11 refills | Status: AC
Start: 2023-06-16 — End: ?

## 2023-06-16 NOTE — Telephone Encounter (Signed)
Last filled 05-19-23 #60 Last OV 04-07-23 Next OV 10-09-23 [facility]

## 2023-06-16 NOTE — Progress Notes (Signed)
Follow-Up Visit   Subjective  Patrick [name] is a 87 y.o. male who presents for the following: 4 months f/u on Actinic keratosis. Patient c/o growth on the left chin appeared after he had went to the dentist for a procedure. Patient report dandruff on his scalp using Ketoconazole shampoo 3 days a week with a fair response.  The patient has spots, moles and lesions to be evaluated, some may be new or changing and the patient may have concern these could be cancer.   The following portions of the chart were reviewed this encounter and updated as appropriate: medications, allergies, medical history  Review of Systems:  No other skin or systemic complaints except as noted in HPI or Assessment and Plan.  Objective  Well appearing patient in no apparent distress; mood and affect are within normal limits.  A focused examination was performed of the following areas:face,scalp,arms,hands   Relevant exam findings are noted in the Assessment and Plan.  left chin Left chin erythematous edematous plaque with focal depressable pustules       Assessment & Plan   SEBORRHEIC DERMATITIS Exam: Diffuse greasy scale at scalp   Chronic and persistent condition with duration or expected duration over one year. Condition is symptomatic/ bothersome to patient. Not currently at goal.   Seborrheic Dermatitis is a chronic persistent rash characterized by pinkness and scaling most commonly of the mid face but also can occur on the scalp (dandruff), ears; mid chest, mid back and groin.  It tends to be exacerbated by stress and cooler weather.  People who have neurologic disease may experience new onset or exacerbation of existing seborrheic dermatitis.  The condition is not curable but treatable and can be controlled.  Treatment Plan: Start Clobetasol .05% solution apply to scalp bid until scaling resolves then stop. Wash off face if it gets on face; can cause acne and other rashes.  Continue  Ketoconazole shampoo apply three times per week, massage into scalp and leave in for 10 minutes before rinsing out    Seborrheic dermatitis  Related Medications ketoconazole (NIZORAL) 2 % shampoo apply three times per week, massage into scalp and leave in for 10 minutes before rinsing out  clobetasol (TEMOVATE) 0.05 % external solution Apply 1 Application topically 2 (two) times daily. Apply to scalp until scaling resolves, then stop  Abscess left chin  Deferring antibiotics given small size of lesion and lack of systemic systems, but low threshold empirically treat for MSSA MRSA if not improving Bacterial culture pending   Incision and Drainage - left chin Location: Left chin   Informed Consent: Discussed risks (permanent scarring, light or dark discoloration, infection, pain, bleeding, bruising, redness, damage to adjacent structures, and recurrence of the lesion) and benefits of the procedure, as well as the alternatives.  Informed consent was obtained.  Preparation: The area was prepped with alcohol.  Anesthesia: Lidocaine 1% without epinephrine  Procedure Details: An incision was made overlying the lesion. The lesion drained pus.  A small amount of fluid was drained.    Antibiotic ointment and a sterile pressure dressing were applied. The patient tolerated procedure well.  Total number of lesions drained: 1  Plan: The patient was instructed on post-op care. Recommend OTC analgesia as needed for pain.   Related Procedures Anaerobic and Aerobic Culture  Actinic keratoses   ACTINIC DAMAGE - chronic, secondary to cumulative UV radiation exposure/sun exposure over time - diffuse scaly erythematous macules with underlying dyspigmentation - Recommend daily broad spectrum sunscreen  SPF 30+ to sun-exposed areas, reapply every 2 hours as needed.  - Recommend staying in the shade or wearing long sleeves, sun glasses (UVA+UVB protection) and wide brim hats (4-inch brim around  the entire circumference of the hat). - Call for new or changing lesions. - L helix and nasal dorsum AKs resolved   Return in about 6 months (around 12/17/2023) for AKs, seb derm, .  I, Angelique Holm, CMA, am acting as scribe for Elie Goody, MD .   Documentation: I have reviewed the above documentation for accuracy and completeness, and I agree with the above.  Elie Goody, MD

## 2023-06-16 NOTE — Patient Instructions (Signed)

## 2023-06-17 ENCOUNTER — Other Ambulatory Visit: Payer: Self-pay | Admitting: Internal Medicine

## 2023-06-20 LAB — ANAEROBIC AND AEROBIC CULTURE

## 2023-06-20 LAB — SPECIMEN STATUS REPORT

## 2023-06-23 ENCOUNTER — Telehealth: Payer: Self-pay

## 2023-06-23 NOTE — Telephone Encounter (Signed)
Patient advised culture results showed bacteria that rarely causes abscesses. Patient reports that he is feeling better, no swelling or pain at I&D site. Butch Penny., RMA

## 2023-06-23 NOTE — Telephone Encounter (Signed)
-----   Message from [place] sent at 06/23/2023  3:31 PM EDT ----- Please call to share culture result. It grew bacteria that rarely cause abscesses. Please get update on how patient is feeling.  Thank you

## 2023-07-04 ENCOUNTER — Other Ambulatory Visit: Payer: Self-pay | Admitting: Internal Medicine

## 2023-07-14 ENCOUNTER — Other Ambulatory Visit: Payer: Self-pay | Admitting: Internal Medicine

## 2023-07-14 DIAGNOSIS — F39 Unspecified mood [affective] disorder: Secondary | ICD-10-CM

## 2023-07-14 NOTE — Telephone Encounter (Signed)
Last filled 06-16-23 #60 Last OV 04-07-23 Next OV 10-09-23 [facility]

## 2023-07-29 ENCOUNTER — Other Ambulatory Visit: Payer: Self-pay | Admitting: Internal Medicine

## 2023-08-21 ENCOUNTER — Other Ambulatory Visit: Payer: Self-pay | Admitting: Internal Medicine

## 2023-08-22 ENCOUNTER — Other Ambulatory Visit: Payer: Self-pay | Admitting: Internal Medicine

## 2023-08-22 DIAGNOSIS — F39 Unspecified mood [affective] disorder: Secondary | ICD-10-CM

## 2023-08-22 NOTE — Telephone Encounter (Signed)
Last filled 07-14-23 #60 Last OV 04-07-23 Next OV 10-09-23 [facility]

## 2023-08-22 NOTE — Telephone Encounter (Signed)
Prescription Request  08/22/2023  LOV: 04/07/2023  What is the name of the medication or equipment? ALPRAZolam (XANAX) 1 MG tablet, only have 2 tablets left  Have you contacted your pharmacy to request a refill? Yes   Which pharmacy would you like this sent to?  [facility] - [place], [place] - [address] [place] [address] Phone: [phone] Fax: [phone]    Patient notified that their request is being sent to the clinical staff for review and that they should receive a response within 2 business days.   Please advise at [facility] [phone]

## 2023-08-23 MED ORDER — ALPRAZOLAM 1 MG PO TABS
ORAL_TABLET | ORAL | 0 refills | Status: DC
Start: 2023-08-23 — End: 2023-09-22

## 2023-08-28 DIAGNOSIS — H401131 Primary open-angle glaucoma, bilateral, mild stage: Secondary | ICD-10-CM | POA: Diagnosis not present

## 2023-09-01 LAB — HM DIABETES EYE EXAM

## 2023-09-04 DIAGNOSIS — H2513 Age-related nuclear cataract, bilateral: Secondary | ICD-10-CM | POA: Diagnosis not present

## 2023-09-04 DIAGNOSIS — H401131 Primary open-angle glaucoma, bilateral, mild stage: Secondary | ICD-10-CM | POA: Diagnosis not present

## 2023-09-04 DIAGNOSIS — E119 Type 2 diabetes mellitus without complications: Secondary | ICD-10-CM | POA: Diagnosis not present

## 2023-09-22 ENCOUNTER — Other Ambulatory Visit: Payer: Self-pay | Admitting: Internal Medicine

## 2023-09-22 DIAGNOSIS — F39 Unspecified mood [affective] disorder: Secondary | ICD-10-CM

## 2023-09-22 NOTE — Telephone Encounter (Signed)
Last filled 08-23-23 #60 Last OV 04-07-23 Next OV 10-09-23 [facility]

## 2023-10-09 ENCOUNTER — Ambulatory Visit (INDEPENDENT_AMBULATORY_CARE_PROVIDER_SITE_OTHER): Payer: PPO | Admitting: Internal Medicine

## 2023-10-09 ENCOUNTER — Encounter: Payer: Self-pay | Admitting: Internal Medicine

## 2023-10-09 VITALS — BP 110/74 | HR 98 | Temp 97.7°F | Ht 64.5 in | Wt 183.0 lb

## 2023-10-09 DIAGNOSIS — I739 Peripheral vascular disease, unspecified: Secondary | ICD-10-CM | POA: Diagnosis not present

## 2023-10-09 DIAGNOSIS — E1142 Type 2 diabetes mellitus with diabetic polyneuropathy: Secondary | ICD-10-CM | POA: Diagnosis not present

## 2023-10-09 DIAGNOSIS — Z7984 Long term (current) use of oral hypoglycemic drugs: Secondary | ICD-10-CM

## 2023-10-09 DIAGNOSIS — Z0000 Encounter for general adult medical examination without abnormal findings: Secondary | ICD-10-CM | POA: Diagnosis not present

## 2023-10-09 DIAGNOSIS — F39 Unspecified mood [affective] disorder: Secondary | ICD-10-CM | POA: Diagnosis not present

## 2023-10-09 DIAGNOSIS — F1320 Sedative, hypnotic or anxiolytic dependence, uncomplicated: Secondary | ICD-10-CM | POA: Diagnosis not present

## 2023-10-09 DIAGNOSIS — R4689 Other symptoms and signs involving appearance and behavior: Secondary | ICD-10-CM

## 2023-10-09 DIAGNOSIS — S069XAS Unspecified intracranial injury with loss of consciousness status unknown, sequela: Secondary | ICD-10-CM | POA: Diagnosis not present

## 2023-10-09 LAB — COMPREHENSIVE METABOLIC PANEL
ALT: 13 U/L (ref 0–53)
AST: 13 U/L (ref 0–37)
Albumin: 4.5 g/dL (ref 3.5–5.2)
Alkaline Phosphatase: 55 U/L (ref 39–117)
BUN: 32 mg/dL — ABNORMAL HIGH (ref 6–23)
CO2: 26 meq/L (ref 19–32)
Calcium: 9.1 mg/dL (ref 8.4–10.5)
Chloride: 101 meq/L (ref 96–112)
Creatinine, Ser: 1.45 mg/dL (ref 0.40–1.50)
GFR: 44.06 mL/min — ABNORMAL LOW (ref >60.00)
Glucose, Bld: 159 mg/dL — ABNORMAL HIGH (ref 70–99)
Potassium: 5 meq/L (ref 3.5–5.1)
Sodium: 139 meq/L (ref 135–145)
Total Bilirubin: 0.4 mg/dL (ref 0.2–1.2)
Total Protein: 7.1 g/dL (ref 6.0–8.3)

## 2023-10-09 LAB — CBC
HCT: 43 % (ref 39.0–52.0)
Hemoglobin: 14.3 g/dL (ref 13.0–17.0)
MCHC: 33.2 g/dL (ref 30.0–36.0)
MCV: 96.1 fL (ref 78.0–100.0)
Platelets: 273 10*3/uL (ref 150.0–400.0)
RBC: 4.48 Mil/uL (ref 4.22–5.81)
RDW: 12.9 % (ref 11.5–15.5)
WBC: 7.1 10*3/uL (ref 4.0–10.5)

## 2023-10-09 LAB — MICROALBUMIN / CREATININE URINE RATIO
Creatinine,U: 216.2 mg/dL
Microalb Creat Ratio: 5.7 mg/g (ref 0.0–30.0)
Microalb, Ur: 12.3 mg/dL — ABNORMAL HIGH (ref 0.0–1.9)

## 2023-10-09 LAB — LIPID PANEL
Cholesterol: 166 mg/dL (ref 0–200)
HDL: 42 mg/dL (ref >39.00)
LDL Cholesterol: 90 mg/dL (ref 0–99)
NonHDL: 123.68
Total CHOL/HDL Ratio: 4
Triglycerides: 169 mg/dL — ABNORMAL HIGH (ref 0.0–149.0)
VLDL: 33.8 mg/dL (ref 0.0–40.0)

## 2023-10-09 LAB — HM DIABETES FOOT EXAM

## 2023-10-09 LAB — HEMOGLOBIN A1C: Hgb A1c MFr Bld: 7.9 % — ABNORMAL HIGH (ref 4.6–6.5)

## 2023-10-09 MED ORDER — VERAPAMIL HCL ER 240 MG PO TBCR: 240.0000 mg | EXTENDED_RELEASE_TABLET | Freq: Every day | ORAL | 3 refills | Status: DC

## 2023-10-09 NOTE — Assessment & Plan Note (Signed)
Mild cognitive changes and vertigo at times--since head trauma No action needed

## 2023-10-09 NOTE — Assessment & Plan Note (Signed)
I have personally reviewed the Medicare Annual Wellness questionnaire and have noted 1. The patient's medical and social history 2. Their use of alcohol, tobacco or illicit drugs 3. Their current medications and supplements 4. The patient's functional ability including ADL's, fall risks, home safety risks and hearing or visual             impairment. 5. Diet and physical activities 6. Evidence for depression or mood disorders  The patients weight, height, BMI and visual acuity have been recorded in the chart I have made referrals, counseling and provided education to the patient based review of the above and I have provided the pt with a written personalized care plan for preventive services.  I have provided you with a copy of your personalized plan for preventive services. Please take the time to review along with your updated medication list.  Done with cancer screening Does some exercise Had COVID and flu vaccines Needs Td and RSV at pharmacy

## 2023-10-09 NOTE — Assessment & Plan Note (Signed)
Seems to have stable claudication pattern Would send back to vascular if worsens Is on ASA  and verapamil

## 2023-10-09 NOTE — Progress Notes (Signed)
Hearing Screening - Comments:: Passed whisper test Vision Screening - Comments:: November 2024

## 2023-10-09 NOTE — Assessment & Plan Note (Signed)
Mostly some anxiety Uses the xanax bid

## 2023-10-09 NOTE — Progress Notes (Signed)
Subjective:    Patient ID: Patrick [name], male    DOB: 08-16-1936, 87 y.o.   MRN: [id number]  HPI Here for Medicare wellness visit and follow up of chronic health conditions Reviewed advanced directives Reviewed other doctors--Patrick [name]--ophthal, Patrick [name], Patrick [name] surgery, Patrick [name] No hospitalizations or surgery in the past year Does do bicycle floor machine--and yard work. Walks some Vision is okay---early cataract on left Hearing is okay No alcohol or tobacco No falls Mild mood issues Independent with instrumental ADLs No sig memory issues  Reviewed labs  GFR stable in 50's as of last year  Checks sugars daily Most under 130--especially lately Feet still burn--and has weakness in legs after about 30 minutes has to rest. Then can get back out to it  Still uses the xanax twice a day Gets some trembling if he misses them This does keep him calm No sig depression and not anhedonic--if he can get out and do stuff Still helps care for 56 year old sister Stable cognitive changes since head injury---[place]igo  No chest pain or SOB No dizziness or syncope No palpitations Occasional edema--end of day. Better with propping up  Current Outpatient Medications on File Prior to Visit  Medication Sig Dispense Refill   ALPRAZolam (XANAX) 1 MG tablet TAKE ONE TABLET BY MOUTH TWICE A DAY AS NEEDED 60 tablet 0   Ascorbic Acid (VITAMIN C) 1000 MG tablet Take 1,000 mg by mouth daily.     aspirin 81 MG tablet Take 81 mg by mouth daily.     betamethasone dipropionate (DIPROLENE) 0.05 % ointment Apply to aa's psoriasis QD-BID PRN. Avoid the face, groin, and axilla. 50 g 2   Blood Glucose Monitoring Suppl (ONE TOUCH ULTRA 2) w/Device KIT SMARTSIG:1 Each Via Meter As Directed     cholecalciferol (VITAMIN D) 1000 units tablet Take 1,000 Units by mouth daily.     clobetasol (TEMOVATE) 0.05 % external solution Apply  1 Application topically 2 (two) times daily. Apply to scalp until scaling resolves, then stop 50 mL 11   fish oil-omega-3 fatty acids 1000 MG capsule Take 2 g by mouth daily.     gabapentin (NEURONTIN) 100 MG capsule TAKE ONE OR TWO CAPSULES BY MOUTH AT BEDTIME 60 capsule 11   glimepiride (AMARYL) 4 MG tablet TAKE ONE TABLET BY MOUTH TWICE A DAY FOR DIABETES 200 tablet 3   hydrochlorothiazide (HYDRODIURIL) 25 MG tablet TAKE ONE TABLET BY MOUTH ONCE DAILY 90 tablet 3   ketoconazole (NIZORAL) 2 % cream Apply twice daily as needed 60 g 1   ketoconazole (NIZORAL) 2 % shampoo apply three times per week, massage into scalp and leave in for 10 minutes before rinsing out 120 mL 11   latanoprost (XALATAN) 0.005 % ophthalmic solution Place 1 drop into both eyes Daily.     lisinopril (ZESTRIL) 40 MG tablet TAKE ONE TABLET BY MOUTH EVERY MORNING FOR BLOOD PRESSURE 90 tablet 3   metFORMIN (GLUCOPHAGE) 1000 MG tablet TAKE ONE TABLET BY MOUTH BEFORE BREAKFAST AND THEN ONE TABLET BEFORE DINNER AND ONE HALF (1/2) TABLET AT BEDTIME FOR DIABETES. 260 tablet 3   mupirocin ointment (BACTROBAN) 2 % APPLY 1 APPLICATION TOPICALLY 3 TIMES DAILY. APPLY TO OPEN AREAS BUTTOCKS CREASE AS DIRECTED 22 g 2   ONETOUCH DELICA LANCETS 33G MISC 1 Units by Does not apply route as needed. Use one to obtain blood sugar. Dx  E11.49 100 each 3  ONETOUCH ULTRA TEST test strip USE TO CHECK BLOOD SUGAR TWICE DAILY DX E11.49 100 each 3   verapamil (CALAN-SR) 240 MG CR tablet TAKE ONE TABLET BY MOUTH EVERY MORNING FOR BLOOD PRESSURE EMERGENCY FILL 30 tablet 11   vitamin E 400 UNIT capsule Take 400 Units by mouth daily.     No current facility-administered medications on file prior to visit.    No Known Allergies  Past Medical History:  Diagnosis Date   Anxiety    BCC (basal cell carcinoma) 11/27/2021   left posterior neck, EDC 12/18/2021   Diabetes mellitus    GERD (gastroesophageal reflux disease)    Hyperlipidemia    Hypertension     Irregular heartbeat    Patrick. (his) stated that it was "nothing to worry about."   Peptic ulcer disease    Renal cyst    Rosacea    Squamous cell carcinoma in situ (SCCIS) 06/26/2022   left elbow sccis scheduled for Patrick [name] 08/14/2022    Past Surgical History:  Procedure Laterality Date   CHOLECYSTECTOMY     KNEE ARTHROSCOPY     left   PARTIAL GASTRECTOMY  1974    Family History  Problem Relation Age of Onset   Cancer Brother        prostate   Colon cancer Brother    Kidney failure Brother    Cancer Sister    Heart disease Brother    Cancer Brother        prostate cancer   Cancer Brother        prostate    Social History   Socioeconomic History   Marital status: Widowed    Spouse name: Not on file   Number of children: 2   Years of education: Not on file   Highest education level: Not on file  Occupational History   Occupation: RETIRED, Architect: RETIRED  Tobacco Use   Smoking status: Never    Passive exposure: Never   Smokeless tobacco: Never  Substance and Sexual Activity   Alcohol use: No   Drug use: Not on file   Sexual activity: Not on file  Other Topics Concern   Not on file  Social History Narrative   Now has living will   Son is health care POA--no alternate   Would accept resuscitation attempts   No tube feeds or life support if cognitively unaware   Social Drivers of Health   Financial Resource Strain: Low Risk  (04/03/2020)   Overall Financial Resource Strain (CARDIA)    Difficulty of Paying Living Expenses: Not hard at all  Food Insecurity: Not on file  Transportation Needs: Not on file  Physical Activity: Not on file  Stress: Not on file  Social Connections: Not on file  Intimate Partner Violence: Not on file   Review of Systems Appetite is good Weight stable Not a good sleeper---nothing new Wears seat belt Some recent dental work--fillings Some heartburn after eating---gaviscon helps Bowels are fine Urinary stream  is a little slow---slight dribbling. Does seem to empty No sig back or joint pains No suspicious skin lesions    Objective:   Physical Exam Constitutional:      Appearance: Normal appearance.  HENT:     Mouth/Throat:     Pharynx: No oropharyngeal exudate or posterior oropharyngeal erythema.  Eyes:     Conjunctiva/sclera: Conjunctivae normal.     Pupils: Pupils are equal, round, and reactive to light.  Cardiovascular:     Rate  and Rhythm: Normal rate and regular rhythm.     Heart sounds: No murmur heard.    No gallop.     Comments: Faint pedal pulses Pulmonary:     Effort: Pulmonary effort is normal.     Breath sounds: Normal breath sounds. No wheezing or rales.  Abdominal:     Palpations: Abdomen is soft.     Tenderness: There is no abdominal tenderness.  Musculoskeletal:     Cervical back: Neck supple.     Comments: 1+foot and ankle edema  Lymphadenopathy:     Cervical: No cervical adenopathy.  Skin:    Findings: No lesion or rash.     Comments: No foot lesions  Neurological:     General: No focal deficit present.     Mental Status: He is alert and oriented to person, place, and time.     Comments: Word naming---froze and didn't get any Recall--2/3 Mildly decreased sensation in feet  Psychiatric:        Mood and Affect: Mood normal.        Behavior: Behavior normal.            Assessment & Plan:

## 2023-10-09 NOTE — Patient Instructions (Signed)
Please get a tetanus booster and RSV vaccine at the pharmacy

## 2023-10-09 NOTE — Assessment & Plan Note (Addendum)
Hopefully still good control On glimepriide 4mg  bid and metformin 1000/1000/500 Mild neuropathy--on gabapentin

## 2023-10-09 NOTE — Assessment & Plan Note (Signed)
PDMP reviewed No concerns 

## 2023-10-18 ENCOUNTER — Other Ambulatory Visit: Payer: Self-pay | Admitting: Internal Medicine

## 2023-10-18 DIAGNOSIS — F39 Unspecified mood [affective] disorder: Secondary | ICD-10-CM

## 2023-10-20 NOTE — Telephone Encounter (Signed)
Last filled 09-22-23 #60 Last OV 10-09-23 Next OV 04-12-24 [facility]

## 2023-11-17 ENCOUNTER — Other Ambulatory Visit: Payer: Self-pay | Admitting: Internal Medicine

## 2023-11-17 DIAGNOSIS — F39 Unspecified mood [affective] disorder: Secondary | ICD-10-CM

## 2023-11-17 NOTE — Telephone Encounter (Signed)
Last filled 10-20-23 #60 Last OV 10-09-23 Next OV 04-12-24 [facility]

## 2023-12-08 ENCOUNTER — Other Ambulatory Visit: Payer: Self-pay | Admitting: Internal Medicine

## 2023-12-15 ENCOUNTER — Other Ambulatory Visit: Payer: Self-pay | Admitting: Internal Medicine

## 2023-12-15 DIAGNOSIS — F39 Unspecified mood [affective] disorder: Secondary | ICD-10-CM

## 2023-12-15 NOTE — Telephone Encounter (Signed)
Last filled 11-17-23 #60 Last OV 10-09-23 Next OV 04-12-24 [facility]

## 2023-12-18 ENCOUNTER — Ambulatory Visit: Payer: PPO | Admitting: Dermatology

## 2024-01-12 ENCOUNTER — Other Ambulatory Visit: Payer: Self-pay | Admitting: Internal Medicine

## 2024-01-12 DIAGNOSIS — F39 Unspecified mood [affective] disorder: Secondary | ICD-10-CM

## 2024-01-12 NOTE — Telephone Encounter (Signed)
 Last filled 12-15-23 #60 Last OV 10-09-23 Next OV 04-12-24 [facility]

## 2024-01-19 ENCOUNTER — Telehealth: Payer: Self-pay | Admitting: Internal Medicine

## 2024-01-19 MED ORDER — EMPAGLIFLOZIN 10 MG PO TABS: 10.0000 mg | ORAL_TABLET | Freq: Every day | ORAL | 11 refills | Status: DC

## 2024-01-19 NOTE — Addendum Note (Signed)
 Addended by: Tillman Abide I on: 01/19/2024 10:09 AM   Modules accepted: Orders

## 2024-01-19 NOTE — Telephone Encounter (Signed)
 Pt called back requesting to speak with Dr. Alphonsus Sias again. Pt stated he had more questions to ask Dr. Alphonsus Sias. Call back # [phone].

## 2024-01-19 NOTE — Telephone Encounter (Signed)
 Spoke to pt. He said he would be willing to try [place] or Jardiance if needed. Said send to [facility] if he needs to go ahead and start medication.

## 2024-01-19 NOTE — Telephone Encounter (Signed)
 Let him know I sent the Rx. He should expect to be peeing more--but should let us know if any other problems with it

## 2024-01-19 NOTE — Telephone Encounter (Signed)
 Message left at patient's home--then call to son (who is on DPR)  Explained the error with the urine microal calculation--and that it doesn't add anything to our plans for him (since he had known CKD anyway) Is on lisinopril.  Plan would be to add farxiga or jardiance if diabetes control worsens (limiting now due to cost)

## 2024-01-19 NOTE — Telephone Encounter (Signed)
 Spoke to pt

## 2024-02-09 ENCOUNTER — Other Ambulatory Visit: Payer: Self-pay | Admitting: Internal Medicine

## 2024-02-09 DIAGNOSIS — F39 Unspecified mood [affective] disorder: Secondary | ICD-10-CM

## 2024-02-09 NOTE — Telephone Encounter (Signed)
 Last filled 01-12-24 #60 Last OV 10-09-23 Next OV 04-12-24 [facility]

## 2024-03-06 ENCOUNTER — Other Ambulatory Visit: Payer: Self-pay | Admitting: Internal Medicine

## 2024-03-06 DIAGNOSIS — F39 Unspecified mood [affective] disorder: Secondary | ICD-10-CM

## 2024-03-08 NOTE — Telephone Encounter (Signed)
 Last filled 02-09-24 #60 Last OV 10-09-23 Next OV 04-12-24 [facility]

## 2024-03-24 DIAGNOSIS — H04123 Dry eye syndrome of bilateral lacrimal glands: Secondary | ICD-10-CM | POA: Diagnosis not present

## 2024-03-24 DIAGNOSIS — H401131 Primary open-angle glaucoma, bilateral, mild stage: Secondary | ICD-10-CM | POA: Diagnosis not present

## 2024-03-24 DIAGNOSIS — H2513 Age-related nuclear cataract, bilateral: Secondary | ICD-10-CM | POA: Diagnosis not present

## 2024-03-24 DIAGNOSIS — E119 Type 2 diabetes mellitus without complications: Secondary | ICD-10-CM | POA: Diagnosis not present

## 2024-04-05 ENCOUNTER — Other Ambulatory Visit: Payer: Self-pay | Admitting: Internal Medicine

## 2024-04-05 DIAGNOSIS — F39 Unspecified mood [affective] disorder: Secondary | ICD-10-CM

## 2024-04-05 NOTE — Telephone Encounter (Signed)
 Last filled 03-08-24 #60 Last OV 10-09-23 Next OV 04-12-24 [facility]

## 2024-04-12 ENCOUNTER — Ambulatory Visit (INDEPENDENT_AMBULATORY_CARE_PROVIDER_SITE_OTHER): Payer: PPO | Admitting: Internal Medicine

## 2024-04-12 ENCOUNTER — Ambulatory Visit: Payer: Self-pay | Admitting: Internal Medicine

## 2024-04-12 ENCOUNTER — Encounter: Payer: Self-pay | Admitting: Internal Medicine

## 2024-04-12 VITALS — BP 120/78 | HR 88 | Temp 97.7°F | Ht 64.5 in | Wt 179.0 lb

## 2024-04-12 DIAGNOSIS — G629 Polyneuropathy, unspecified: Secondary | ICD-10-CM | POA: Diagnosis not present

## 2024-04-12 DIAGNOSIS — Z7984 Long term (current) use of oral hypoglycemic drugs: Secondary | ICD-10-CM | POA: Diagnosis not present

## 2024-04-12 DIAGNOSIS — I10 Essential (primary) hypertension: Secondary | ICD-10-CM

## 2024-04-12 DIAGNOSIS — E1142 Type 2 diabetes mellitus with diabetic polyneuropathy: Secondary | ICD-10-CM | POA: Diagnosis not present

## 2024-04-12 DIAGNOSIS — N1831 Chronic kidney disease, stage 3a: Secondary | ICD-10-CM

## 2024-04-12 LAB — POCT GLYCOSYLATED HEMOGLOBIN (HGB A1C): Hemoglobin A1C: 7 % — AB (ref 4.0–5.6)

## 2024-04-12 NOTE — Progress Notes (Signed)
 Subjective:    Patient ID: Patrick [name], male    DOB: 28-Mar-1936, 88 y.o.   MRN: [id number]  HPI Here for follow up of diabetes and other chronic health conditions  Neuropathy is worsening Feels his legs giving way Has had some falls due to loss of balance--discussed walking stick/cane Some leg aching and burning in bottom of feet Gabapentin  100mg  bid   Last GFR 44--but had been mostly in the 50's in past years  Has had nocturia since starting jardiance  Checks sugars every morning---running pretty good in AM, higher in evening Has some mild low sugar feeling--- 50-60's. Nothing that required action  No chest pain or SOB No syncope--does feel dizzy at times  Current Outpatient Medications on File Prior to Visit  Medication Sig Dispense Refill   ALPRAZolam  (XANAX ) 1 MG tablet TAKE 1 TABLET BY MOUTH TWICE A DAY AS NEEDED 60 tablet 0   Ascorbic Acid (VITAMIN C) 1000 MG tablet Take 1,000 mg by mouth daily.     aspirin 81 MG tablet Take 81 mg by mouth daily.     betamethasone  dipropionate (DIPROLENE ) 0.05 % ointment Apply to aa's psoriasis QD-BID PRN. Avoid the face, groin, and axilla. 50 g 2   Blood Glucose Monitoring Suppl (ONE TOUCH ULTRA 2) w/Device KIT SMARTSIG:1 Each Via Meter As Directed     cholecalciferol (VITAMIN D) 1000 units tablet Take 1,000 Units by mouth daily.     clobetasol  (TEMOVATE ) 0.05 % external solution Apply 1 Application topically 2 (two) times daily. Apply to scalp until scaling resolves, then stop 50 mL 11   empagliflozin  (JARDIANCE ) 10 MG TABS tablet Take 1 tablet (10 mg total) by mouth daily before breakfast. 30 tablet 11   fish oil-omega-3 fatty acids 1000 MG capsule Take 2 g by mouth daily.     gabapentin  (NEURONTIN ) 100 MG capsule TAKE ONE OR TWO CAPSULES BY MOUTH AT BEDTIME 60 capsule 11   glimepiride (AMARYL) 4 MG tablet TAKE ONE TABLET BY MOUTH TWICE A DAY FOR DIABETES 200 tablet 3   hydrochlorothiazide  (HYDRODIURIL ) 25 MG tablet TAKE ONE  TABLET BY MOUTH ONCE DAILY 90 tablet 3   ketoconazole  (NIZORAL ) 2 % cream Apply twice daily as needed 60 g 1   ketoconazole  (NIZORAL ) 2 % shampoo apply three times per week, massage into scalp and leave in for 10 minutes before rinsing out 120 mL 11   latanoprost (XALATAN) 0.005 % ophthalmic solution Place 1 drop into both eyes Daily.     lisinopril (ZESTRIL) 40 MG tablet TAKE ONE TABLET BY MOUTH EVERY MORNING FOR BLOOD PRESSURE 90 tablet 3   metFORMIN  (GLUCOPHAGE ) 1000 MG tablet TAKE ONE TABLET BY MOUTH BEFORE BREAKFAST AND THEN ONE TABLET BEFORE DINNER AND ONE HALF (1/2) TABLET AT BEDTIME FOR DIABETES. 260 tablet 3   mupirocin  ointment (BACTROBAN ) 2 % APPLY 1 APPLICATION TOPICALLY 3 TIMES DAILY. APPLY TO OPEN AREAS BUTTOCKS CREASE AS DIRECTED 22 g 2   ONETOUCH DELICA LANCETS 33G MISC 1 Units by Does not apply route as needed. Use one to obtain blood sugar. Dx  E11.49 100 each 3   ONETOUCH ULTRA TEST test strip USE TO CHECK BLOOD SUGAR TWICE DAILY DX E11.49 100 each 3   verapamil  (CALAN -SR) 240 MG CR tablet Take 1 tablet (240 mg total) by mouth daily. 90 tablet 3   vitamin E 400 UNIT capsule Take 400 Units by mouth daily.     No current facility-administered medications on file prior to visit.  No Known Allergies  Past Medical History:  Diagnosis Date   Anxiety    BCC (basal cell carcinoma) 11/27/2021   left posterior neck, EDC 12/18/2021   Diabetes mellitus    GERD (gastroesophageal reflux disease)    Hyperlipidemia    Hypertension    Irregular heartbeat    Dr. (his) stated that it was nothing to worry about.   Peptic ulcer disease    Renal cyst    Rosacea    Squamous cell carcinoma in situ (SCCIS) 06/26/2022   left elbow sccis scheduled for [facility] 08/14/2022    Past Surgical History:  Procedure Laterality Date   CHOLECYSTECTOMY     KNEE ARTHROSCOPY     left   PARTIAL GASTRECTOMY  1974    Family History  Problem Relation Age of Onset   Cancer Brother        prostate    Colon cancer Brother    Kidney failure Brother    Cancer Sister    Heart disease Brother    Cancer Brother        prostate cancer   Cancer Brother        prostate    Social History   Socioeconomic History   Marital status: Widowed    Spouse name: Not on file   Number of children: 2   Years of education: Not on file   Highest education level: Not on file  Occupational History   Occupation: RETIRED, Architect: RETIRED  Tobacco Use   Smoking status: Never    Passive exposure: Never   Smokeless tobacco: Never  Substance and Sexual Activity   Alcohol use: No   Drug use: Not on file   Sexual activity: Not on file  Other Topics Concern   Not on file  Social History Narrative   Now has living will   Son is health care POA--no alternate   Would accept resuscitation attempts   No tube feeds or life support if cognitively unaware   Social Drivers of Health   Financial Resource Strain: Low Risk  (04/03/2020)   Overall Financial Resource Strain (CARDIA)    Difficulty of Paying Living Expenses: Not hard at all  Food Insecurity: Not on file  Transportation Needs: Not on file  Physical Activity: Not on file  Stress: Not on file  Social Connections: Not on file  Intimate Partner Violence: Not on file   Review of Systems Appetite is okay Weight fairly stable    Objective:   Physical Exam Constitutional:      Appearance: Normal appearance.   Cardiovascular:     Rate and Rhythm: Normal rate and regular rhythm.     Heart sounds: No murmur heard.    No gallop.     Comments: 1+ pedal pulses Pulmonary:     Effort: Pulmonary effort is normal.     Breath sounds: Normal breath sounds. No wheezing or rales.   Musculoskeletal:     Right lower leg: No edema.     Left lower leg: No edema.   Neurological:     Mental Status: He is alert.   Psychiatric:        Mood and Affect: Mood normal.        Behavior: Behavior normal.            Assessment & Plan:

## 2024-04-12 NOTE — Patient Instructions (Signed)
 Please use a walking stick or cane to help your balance Try lidocaine cream on your feet for the neuropathy pain----you could also increase the gabapentin  to 200 or even 300mg  at bedtime to help the pain

## 2024-04-12 NOTE — Addendum Note (Signed)
 Addended by: Franne Ivory on: 04/12/2024 09:54 AM   Modules accepted: Orders

## 2024-04-12 NOTE — Assessment & Plan Note (Signed)
 Had dropped to 44 last time Bothered by nocturia but discussed benefits of jardiance  Will continue as long as GFR same or better

## 2024-04-12 NOTE — Assessment & Plan Note (Signed)
 Discussed using walking stick Gabapentin  100 bid-- could increase this Discussed trying lidocaine cream

## 2024-04-12 NOTE — Addendum Note (Signed)
 Addended by: Bernadene Brewer on: 04/12/2024 09:31 AM   Modules accepted: Orders

## 2024-04-12 NOTE — Assessment & Plan Note (Signed)
 BP Readings from Last 3 Encounters:  04/12/24 120/78  10/09/23 110/74  06/16/23 131/80   Controlled on verapamil  240 and hydrochlorothiazide  25

## 2024-04-12 NOTE — Assessment & Plan Note (Signed)
 A1c is down to 7% with adding jardiance  10mg  Glimepiride 4mg  daily (would decrease if any significant hypoglycemia) Metformin  1000 /500

## 2024-04-13 LAB — RENAL FUNCTION PANEL
Albumin: 4.5 g/dL (ref 3.6–5.1)
BUN/Creatinine Ratio: 26 (calc) — ABNORMAL HIGH (ref 6–22)
BUN: 40 mg/dL — ABNORMAL HIGH (ref 7–25)
CO2: 20 mmol/L (ref 20–32)
Calcium: 9.6 mg/dL (ref 8.6–10.3)
Chloride: 103 mmol/L (ref 98–110)
Creat: 1.51 mg/dL — ABNORMAL HIGH (ref 0.70–1.22)
Glucose, Bld: 184 mg/dL — ABNORMAL HIGH (ref 65–99)
Phosphorus: 3.9 mg/dL (ref 2.1–4.3)
Potassium: 5.6 mmol/L — ABNORMAL HIGH (ref 3.5–5.3)
Sodium: 140 mmol/L (ref 135–146)

## 2024-04-14 ENCOUNTER — Telehealth: Payer: Self-pay

## 2024-04-14 NOTE — Telephone Encounter (Signed)
 Copied from CRM [id number]. Topic: Clinical - Lab/Test Results >> Apr 14, 2024  1:39 PM Adonis Hoot wrote: Reason for CRM: Patient returned call regarding lab results.I relayed message from [facility] to patient and he verbalized understanding and stated that he would stop taking jardiance  medication.

## 2024-04-14 NOTE — Telephone Encounter (Signed)
 Thank you :)

## 2024-04-26 ENCOUNTER — Other Ambulatory Visit: Payer: Self-pay | Admitting: Internal Medicine

## 2024-05-03 ENCOUNTER — Other Ambulatory Visit: Payer: Self-pay | Admitting: Family

## 2024-05-03 DIAGNOSIS — F39 Unspecified mood [affective] disorder: Secondary | ICD-10-CM

## 2024-05-19 ENCOUNTER — Other Ambulatory Visit: Payer: Self-pay | Admitting: Internal Medicine

## 2024-05-19 DIAGNOSIS — F39 Unspecified mood [affective] disorder: Secondary | ICD-10-CM

## 2024-05-19 MED ORDER — ALPRAZOLAM 1 MG PO TABS: 1.0000 mg | ORAL_TABLET | Freq: Two times a day (BID) | ORAL | 0 refills | Status: DC | PRN

## 2024-05-19 NOTE — Telephone Encounter (Signed)
 Copied from CRM [id number]. Topic: Clinical - Medication Refill >> May 19, 2024  9:05 AM Henretta I wrote: Medication: ALPRAZolam  (XANAX ) 1 MG tablet  hydrochlorothiazide  (HYDRODIURIL ) 25 MG tablet  Has the patient contacted their pharmacy? Yes (Agent: If no, request that the patient contact the pharmacy for the refill. If patient does not wish to contact the pharmacy document the reason why and proceed with request.) (Agent: If yes, when and what did the pharmacy advise?)  This is the patient's preferred pharmacy:  [facility] - [place], [place] - [address] [place] [address] Phone: [phone] Fax: [phone]  Is this the correct pharmacy for this prescription? Yes If no, delete pharmacy and type the correct one.   Has the prescription been filled recently? No  Is the patient out of the medication? N/a, pharmacy called stating patient does need these before next appointment   Has the patient been seen for an appointment in the last year OR does the patient have an upcoming appointment? Yes  Can we respond through MyChart? No  Agent: Please be advised that Rx refills may take up to 3 business days. We ask that you follow-up with your pharmacy.

## 2024-05-19 NOTE — Telephone Encounter (Signed)
 Last filled 04-05-24 #60 Last OV 04-12-24 No Future OV [facility]

## 2024-05-21 ENCOUNTER — Encounter: Payer: Self-pay | Admitting: Internal Medicine

## 2024-06-07 ENCOUNTER — Other Ambulatory Visit: Payer: Self-pay | Admitting: Internal Medicine

## 2024-06-11 ENCOUNTER — Other Ambulatory Visit: Payer: Self-pay | Admitting: Internal Medicine

## 2024-06-11 DIAGNOSIS — F39 Unspecified mood [affective] disorder: Secondary | ICD-10-CM

## 2024-06-11 NOTE — Telephone Encounter (Signed)
 Last filled 05-19-24 #60 Last OV 04-12-24 Next OV 07-19-24 [facility]

## 2024-06-14 ENCOUNTER — Other Ambulatory Visit: Payer: Self-pay | Admitting: Internal Medicine

## 2024-07-14 ENCOUNTER — Other Ambulatory Visit: Payer: Self-pay

## 2024-07-14 DIAGNOSIS — F39 Unspecified mood [affective] disorder: Secondary | ICD-10-CM

## 2024-07-14 MED ORDER — ALPRAZOLAM 1 MG PO TABS: ORAL_TABLET | ORAL | 0 refills | Status: DC

## 2024-07-14 NOTE — Telephone Encounter (Signed)
 Last filled 06-11-24 #60 Last OV 04-12-24 Next OV 07-19-24 [facility]

## 2024-07-19 ENCOUNTER — Encounter: Payer: Self-pay | Admitting: Pharmacist

## 2024-07-19 ENCOUNTER — Ambulatory Visit

## 2024-07-19 VITALS — BP 138/82 | HR 65 | Temp 98.5°F | Ht 64.5 in | Wt 179.0 lb

## 2024-07-19 DIAGNOSIS — N1831 Chronic kidney disease, stage 3a: Secondary | ICD-10-CM

## 2024-07-19 DIAGNOSIS — F1320 Sedative, hypnotic or anxiolytic dependence, uncomplicated: Secondary | ICD-10-CM

## 2024-07-19 DIAGNOSIS — Z23 Encounter for immunization: Secondary | ICD-10-CM | POA: Diagnosis not present

## 2024-07-19 DIAGNOSIS — E1142 Type 2 diabetes mellitus with diabetic polyneuropathy: Secondary | ICD-10-CM

## 2024-07-19 DIAGNOSIS — E785 Hyperlipidemia, unspecified: Secondary | ICD-10-CM | POA: Diagnosis not present

## 2024-07-19 DIAGNOSIS — N183 Chronic kidney disease, stage 3 unspecified: Secondary | ICD-10-CM | POA: Diagnosis not present

## 2024-07-19 DIAGNOSIS — E1122 Type 2 diabetes mellitus with diabetic chronic kidney disease: Secondary | ICD-10-CM | POA: Diagnosis not present

## 2024-07-19 DIAGNOSIS — Z79899 Other long term (current) drug therapy: Secondary | ICD-10-CM | POA: Diagnosis not present

## 2024-07-19 DIAGNOSIS — G629 Polyneuropathy, unspecified: Secondary | ICD-10-CM

## 2024-07-19 LAB — LIPID PANEL
Cholesterol: 176 mg/dL (ref 0–200)
HDL: 43.9 mg/dL (ref >39.00)
LDL Cholesterol: 101 mg/dL — ABNORMAL HIGH (ref 0–99)
NonHDL: 132.19
Total CHOL/HDL Ratio: 4
Triglycerides: 158 mg/dL — ABNORMAL HIGH (ref 0.0–149.0)
VLDL: 31.6 mg/dL (ref 0.0–40.0)

## 2024-07-19 LAB — CBC WITH DIFFERENTIAL/PLATELET
Basophils Absolute: 0 K/uL (ref 0.0–0.1)
Basophils Relative: 0.5 % (ref 0.0–3.0)
Eosinophils Absolute: 0.1 K/uL (ref 0.0–0.7)
Eosinophils Relative: 1.9 % (ref 0.0–5.0)
HCT: 42 % (ref 39.0–52.0)
Hemoglobin: 13.9 g/dL (ref 13.0–17.0)
Lymphocytes Relative: 34.2 % (ref 12.0–46.0)
Lymphs Abs: 2 K/uL (ref 0.7–4.0)
MCHC: 33 g/dL (ref 30.0–36.0)
MCV: 93.9 fl (ref 78.0–100.0)
Monocytes Absolute: 0.5 K/uL (ref 0.1–1.0)
Monocytes Relative: 8.6 % (ref 3.0–12.0)
Neutro Abs: 3.2 K/uL (ref 1.4–7.7)
Neutrophils Relative %: 54.8 % (ref 43.0–77.0)
Platelets: 220 K/uL (ref 150.0–400.0)
RBC: 4.47 Mil/uL (ref 4.22–5.81)
RDW: 13.4 % (ref 11.5–15.5)
WBC: 5.9 K/uL (ref 4.0–10.5)

## 2024-07-19 LAB — POCT GLYCOSYLATED HEMOGLOBIN (HGB A1C): Hemoglobin A1C: 7.7 % — AB (ref 4.0–5.6)

## 2024-07-19 LAB — BASIC METABOLIC PANEL WITH GFR
BUN: 34 mg/dL — ABNORMAL HIGH (ref 6–23)
CO2: 28 meq/L (ref 19–32)
Calcium: 9.6 mg/dL (ref 8.4–10.5)
Chloride: 100 meq/L (ref 96–112)
Creatinine, Ser: 1.24 mg/dL (ref 0.40–1.50)
GFR: 52.87 mL/min — ABNORMAL LOW (ref >60.00)
Glucose, Bld: 146 mg/dL — ABNORMAL HIGH (ref 70–99)
Potassium: 5 meq/L (ref 3.5–5.1)
Sodium: 139 meq/L (ref 135–145)

## 2024-07-19 MED ORDER — ALPRAZOLAM 0.25 MG PO TABS: 0.2500 mg | ORAL_TABLET | Freq: Two times a day (BID) | ORAL | 0 refills | Status: DC | PRN

## 2024-07-19 MED ORDER — GABAPENTIN 300 MG PO CAPS: 300.0000 mg | ORAL_CAPSULE | Freq: Three times a day (TID) | ORAL | 0 refills | Status: DC

## 2024-07-19 NOTE — Patient Instructions (Addendum)
 Thank you for visiting [facility] today! Here's what we talked about: - Stop taking aspirin -Reduce Xanax  dose: Cut the 1mg  tablets in half, take one half along with the 0.25mg  tablets to make total 0.75mg . Take 0.75mg  twice a day for the next 2 weeks.  -Withdrawal symptoms: tremors, anxiety/agitation, insomnia, irritability, poor concentration, nausea vomiting -ED if: SOB, chest pain, worsening dizzyness/confusion -Start taking Gabapentin  300mg  nightly

## 2024-07-19 NOTE — Progress Notes (Signed)
 Chart Review Reason: Medication Coverage  Summary: Xanax  0.25 mg Rx sent to pharmacy today. Denied by insurance given recently filled Xanax  1.0 mg prescription.   Called [facility] to request therapeutic override given change of dose. PCP plan for 0.75 mg dose using both tablet strengths.  Pharmacist confirms with this information, they will be able to override the claim so patient get get the refill today.

## 2024-07-19 NOTE — Telephone Encounter (Signed)
 Disregard. Rx changed at ov today

## 2024-07-19 NOTE — Progress Notes (Signed)
 Subjective:    Patient ID: Patrick [name], male    DOB: [birth year], 88 y.o.   MRN: [id number]   Patrick [name] is a very pleasant 88 y.o. male who presents today for medication management.   Says he only takes xanx to prevent shakes, denies uncontrolled anxiety symptoms currently, says he has been on it for several years since he had a work accident that left him with some anxiety at that time.  Says he tried to quit cold [place] 1 time and got horrible shakes then had to get back on it. Says has taken ASA for years because his doctor told him to, denies any previous heart attacks or strokes, says he does not take it daily, says he takes it about 3 times weekly. Tingling pain in both feet constant, interferes with sleep, says he has been on gabapentin , but it is not helping, would like something for it.   Review of Systems  All other systems reviewed and are negative.       No Known Allergies  Current Outpatient Medications on File Prior to Visit  Medication Sig Dispense Refill   Ascorbic Acid (VITAMIN C) 1000 MG tablet Take 1,000 mg by mouth daily.     betamethasone  dipropionate (DIPROLENE ) 0.05 % ointment Apply to aa's psoriasis QD-BID PRN. Avoid the face, groin, and axilla. 50 g 2   Blood Glucose Monitoring Suppl (ONE TOUCH ULTRA 2) w/Device KIT SMARTSIG:1 Each Via Meter As Directed     cholecalciferol (VITAMIN D) 1000 units tablet Take 1,000 Units by mouth daily.     clobetasol  (TEMOVATE ) 0.05 % external solution Apply 1 Application topically 2 (two) times daily. Apply to scalp until scaling resolves, then stop 50 mL 11   empagliflozin  (JARDIANCE ) 10 MG TABS tablet Take 1 tablet (10 mg total) by mouth daily before breakfast. 30 tablet 11   fish oil-omega-3 fatty acids 1000 MG capsule Take 2 g by mouth daily.     glimepiride (AMARYL) 4 MG tablet TAKE ONE TABLET BY MOUTH TWICE A DAY FOR DIABETES 200 tablet 3   hydrochlorothiazide  (HYDRODIURIL ) 25 MG tablet TAKE ONE TABLET BY  MOUTH ONCE DAILY 90 tablet 3   ketoconazole  (NIZORAL ) 2 % cream Apply twice daily as needed 60 g 1   ketoconazole  (NIZORAL ) 2 % shampoo apply three times per week, massage into scalp and leave in for 10 minutes before rinsing out 120 mL 11   latanoprost (XALATAN) 0.005 % ophthalmic solution Place 1 drop into both eyes Daily.     lisinopril (ZESTRIL) 40 MG tablet TAKE ONE TABLET BY MOUTH EVERY MORNING FOR BLOOD PRESSURE 90 tablet 3   metFORMIN  (GLUCOPHAGE ) 1000 MG tablet TAKE ONE TABLET BY MOUTH BEFORE BREAKFAST AND THEN ONE TABLET BEFORE DINNER AND ONE HALF (1/2) TABLET AT BEDTIME FOR DIABETES. 260 tablet 3   mupirocin  ointment (BACTROBAN ) 2 % APPLY 1 APPLICATION TOPICALLY 3 TIMES DAILY. APPLY TO OPEN AREAS BUTTOCKS CREASE AS DIRECTED 22 g 2   ONETOUCH DELICA LANCETS 33G MISC 1 Units by Does not apply route as needed. Use one to obtain blood sugar. Dx  E11.49 100 each 3   ONETOUCH ULTRA TEST test strip USE TO CHECK BLOOD SUGAR TWICE DAILY DX E11.49 100 each 3   verapamil  (CALAN -SR) 240 MG CR tablet Take 1 tablet (240 mg total) by mouth daily. 90 tablet 3   vitamin E 400 UNIT capsule Take 400 Units by mouth daily.     No current facility-administered medications on  file prior to visit.    BP 138/82 (BP Location: Left Arm, Patient Position: Sitting, Cuff Size: Large)   Pulse 65   Temp 98.5 F (36.9 C) (Oral)   Ht 5' 4.5 (1.638 m)   Wt 179 lb (81.2 kg)   SpO2 95%   BMI 30.25 kg/m   Objective:    Physical Exam Vitals and nursing note reviewed.  Constitutional:      Appearance: Normal appearance.  HENT:     Head: Normocephalic and atraumatic.  Eyes:     Extraocular Movements: Extraocular movements intact.     Conjunctiva/sclera: Conjunctivae normal.  Skin:    General: Skin is warm.  Neurological:     Mental Status: He is alert.  Psychiatric:        Mood and Affect: Mood normal.        Behavior: Behavior normal.            Assessment & Plan:   1. Benzodiazepine  dependence (HCC) (Primary) 2.Medication Management Of note, patient has been on Xanax  for several years, initially to treat anxiety, however, denies any current anxiety symptoms and further denies breakthrough anxiety during the day.  Given reports that patient takes this medication to avoid withdrawal symptoms, there is no medical indication to continue benzodiazepine use in this elderly patient.  Extensively counseled patient about increased risk of dizziness, falls, cardiorespiratory suppression.  He is agreeable to attempt a slow taper, with ultimate goal of discontinuing.  Plan is to reduce total daily dose by 25% every 1 to 2 weeks.  His first taper dose for the next 2 weeks will be 0.75 mg twice daily.  Patient provided handout with taper schedule, withdrawal symptoms to monitor for, as well as ED precautions. Controlled substance use agreement updated, prescription drug monitoring reports reviewed. Given no cardiac history/history of stents or prior stroke, discussed extensively with patient the risks of aspirin use in older patients, particularly increased risk of GI bleed, I do not believe there is a true medical indication for this, we will discontinue.  -Continue alprazolam  1 mg (take half tablet and combine with 0.25 mg tablet for total of 0.75 mg twice daily) - ALPRAZolam  (XANAX ) 0.25 MG tablet; Take 1 tablet (0.25 mg total) by mouth 2 (two) times daily as needed for anxiety.  Dispense: 60 tablet; Refill: 0 - Discontinue aspirin 81 mg  - Continue all other medications as per chart  3. Peripheral polyneuropathy Patient has chronic bilateral peripheral neuropathy, which interferes with sleep, will increase gabapentin  dose as below, plan to further uptitrate as needed, current dose is safe for his renal function.  - gabapentin  (NEURONTIN ) 300 MG capsule; Take 1 capsule (300 mg total) by mouth 3 (three) times daily.  Dispense: 90 capsule; Refill: 0  4. Type 2 diabetes, controlled, with  peripheral neuropathy (HCC) 5. Stage 3a chronic kidney disease (HCC) 6. Hyperlipidemia, unspecified hyperlipidemia type A1c in the office is 7.7, still within age-appropriate goal which would be less than 8, plan for comprehensive diabetic visit at next OV.  In the meantime, we will repeat CBC and BMP in light of stage III CKD, will repeat lipid panel given patient is not on statin and should be on at least low intensity given age and comorbid DM2.  - HgB A1c - Basic Metabolic Panel - CBC with Differential - Lipid Panel   7. Need for flu vaccine Flu shot given this visit.  Return in about 1 week (around 07/26/2024) for Diabetic visit, gabapentin  dose  and lab review. Plan for CPE anytime after 10/08/2024    Jamaria Amborn K Catie Chiao, MD  07/19/24

## 2024-07-22 ENCOUNTER — Ambulatory Visit: Payer: Self-pay

## 2024-08-05 ENCOUNTER — Ambulatory Visit

## 2024-08-05 VITALS — BP 134/84 | HR 87 | Temp 97.9°F | Ht 64.5 in | Wt 174.0 lb

## 2024-08-05 DIAGNOSIS — E785 Hyperlipidemia, unspecified: Secondary | ICD-10-CM

## 2024-08-05 DIAGNOSIS — E1142 Type 2 diabetes mellitus with diabetic polyneuropathy: Secondary | ICD-10-CM | POA: Diagnosis not present

## 2024-08-05 DIAGNOSIS — F1320 Sedative, hypnotic or anxiolytic dependence, uncomplicated: Secondary | ICD-10-CM | POA: Diagnosis not present

## 2024-08-05 DIAGNOSIS — R351 Nocturia: Secondary | ICD-10-CM | POA: Diagnosis not present

## 2024-08-05 LAB — MICROALBUMIN / CREATININE URINE RATIO
Creatinine,U: 239.2 mg/dL
Microalb Creat Ratio: 30.6 mg/g — ABNORMAL HIGH (ref 0.0–30.0)
Microalb, Ur: 7.3 mg/dL — ABNORMAL HIGH (ref 0.0–1.9)

## 2024-08-05 MED ORDER — ATORVASTATIN CALCIUM 20 MG PO TABS: 20.0000 mg | ORAL_TABLET | Freq: Every evening | ORAL | 1 refills | Status: AC

## 2024-08-05 NOTE — Patient Instructions (Addendum)
 Thank you for visiting [facility] today! Here's what we talked about: - Cut the 1mg  pill in half, take the half pill together with the 0.25mg  pill to make a total of 0.75mg . Do this twice daily.  - Ask pharmacy if up to date on Tetanus

## 2024-08-05 NOTE — Progress Notes (Signed)
 Subjective:    Patient ID: Patrick [name], male    DOB: February 05, 1936, 88 y.o.   MRN: [id number]   Patrick [name] is a very pleasant 88 y.o. male who presents today for DM visit. -Endorses polydipsia and polyuria at night Waking up several times a night every night x several months, denies weak stream, denies straining to urinate, does endorse that he feels thirsty so he does drink a lot of water at night.  Current A1c: 7.7 Last A1c: 7 Current meds: Jardiance , glimepiride, metformin  Neuropathy: Yes Nephropathy: Yes Retinopathy: No   Urine microalbumin: Need Eye exam: Done Foot exam Will be due next yr Ace-i/ARB: Yes Statin: Yes Influenza vaccine: UTD Pneumococcal vaccine: UTD Diet: No concerns Weight concerns: No concerns   Review of Systems  All other systems reviewed and are negative.        Past Medical History:  Diagnosis Date   Anxiety    BCC (basal cell carcinoma) 11/27/2021   left posterior neck, EDC 12/18/2021   Diabetes mellitus    GERD (gastroesophageal reflux disease)    Hyperlipidemia    Hypertension    Irregular heartbeat    Dr. (his) stated that it was nothing to worry about.   Peptic ulcer disease    Renal cyst    Rosacea    Squamous cell carcinoma in situ (SCCIS) 06/26/2022   left elbow sccis scheduled for [facility] 08/14/2022    Social History   Socioeconomic History   Marital status: Widowed    Spouse name: Not on file   Number of children: 2   Years of education: Not on file   Highest education level: Not on file  Occupational History   Occupation: RETIRED, Architect: RETIRED  Tobacco Use   Smoking status: Never    Passive exposure: Never   Smokeless tobacco: Never  Substance and Sexual Activity   Alcohol use: No   Drug use: Not on file   Sexual activity: Not on file  Other Topics Concern   Not on file  Social History Narrative   Now has living will   Son is health care POA--no alternate   Would accept  resuscitation attempts   No tube feeds or life support if cognitively unaware   Social Drivers of Health   Financial Resource Strain: Low Risk  (04/03/2020)   Overall Financial Resource Strain (CARDIA)    Difficulty of Paying Living Expenses: Not hard at all  Food Insecurity: Not on file  Transportation Needs: Not on file  Physical Activity: Not on file  Stress: Not on file  Social Connections: Not on file  Intimate Partner Violence: Not on file    Past Surgical History:  Procedure Laterality Date   CHOLECYSTECTOMY     KNEE ARTHROSCOPY     left   PARTIAL GASTRECTOMY  1974    Family History  Problem Relation Age of Onset   Cancer Sister    Cancer Brother        prostate   Colon cancer Brother    Kidney failure Brother    Heart disease Brother    Cancer Brother        prostate cancer   Cancer Brother        prostate    No Known Allergies  Current Outpatient Medications on File Prior to Visit  Medication Sig Dispense Refill   ALPRAZolam  (XANAX ) 0.25 MG tablet Take 1 tablet (0.25 mg total) by mouth 2 (two) times daily as  needed for anxiety. 60 tablet 0   ALPRAZolam  (XANAX ) 1 MG tablet Take 1 mg by mouth See admin instructions. Pt states he is taking 1 tab in am     Ascorbic Acid (VITAMIN C) 1000 MG tablet Take 1,000 mg by mouth daily.     betamethasone  dipropionate (DIPROLENE ) 0.05 % ointment Apply to aa's psoriasis QD-BID PRN. Avoid the face, groin, and axilla. 50 g 2   Blood Glucose Monitoring Suppl (ONE TOUCH ULTRA 2) w/Device KIT SMARTSIG:1 Each Via Meter As Directed     cholecalciferol (VITAMIN D) 1000 units tablet Take 1,000 Units by mouth daily.     clobetasol  (TEMOVATE ) 0.05 % external solution Apply 1 Application topically 2 (two) times daily. Apply to scalp until scaling resolves, then stop 50 mL 11   empagliflozin  (JARDIANCE ) 10 MG TABS tablet Take 1 tablet (10 mg total) by mouth daily before breakfast. 30 tablet 11   fish oil-omega-3 fatty acids 1000 MG capsule  Take 2 g by mouth daily.     gabapentin  (NEURONTIN ) 300 MG capsule Take 1 capsule (300 mg total) by mouth 3 (three) times daily. 90 capsule 0   glimepiride (AMARYL) 4 MG tablet TAKE ONE TABLET BY MOUTH TWICE A DAY FOR DIABETES 200 tablet 3   hydrochlorothiazide  (HYDRODIURIL ) 25 MG tablet TAKE ONE TABLET BY MOUTH ONCE DAILY 90 tablet 3   ketoconazole  (NIZORAL ) 2 % cream Apply twice daily as needed 60 g 1   ketoconazole  (NIZORAL ) 2 % shampoo apply three times per week, massage into scalp and leave in for 10 minutes before rinsing out 120 mL 11   latanoprost (XALATAN) 0.005 % ophthalmic solution Place 1 drop into both eyes Daily.     lisinopril (ZESTRIL) 40 MG tablet TAKE ONE TABLET BY MOUTH EVERY MORNING FOR BLOOD PRESSURE 90 tablet 3   metFORMIN  (GLUCOPHAGE ) 1000 MG tablet TAKE ONE TABLET BY MOUTH BEFORE BREAKFAST AND THEN ONE TABLET BEFORE DINNER AND ONE HALF (1/2) TABLET AT BEDTIME FOR DIABETES. 260 tablet 3   mupirocin  ointment (BACTROBAN ) 2 % APPLY 1 APPLICATION TOPICALLY 3 TIMES DAILY. APPLY TO OPEN AREAS BUTTOCKS CREASE AS DIRECTED 22 g 2   ONETOUCH DELICA LANCETS 33G MISC 1 Units by Does not apply route as needed. Use one to obtain blood sugar. Dx  E11.49 100 each 3   ONETOUCH ULTRA TEST test strip USE TO CHECK BLOOD SUGAR TWICE DAILY DX E11.49 100 each 3   verapamil  (CALAN -SR) 240 MG CR tablet Take 1 tablet (240 mg total) by mouth daily. 90 tablet 3   vitamin E 400 UNIT capsule Take 400 Units by mouth daily.     No current facility-administered medications on file prior to visit.    BP 134/84 (BP Location: Right Arm, Patient Position: Sitting, Cuff Size: Normal)   Pulse 87   Temp 97.9 F (36.6 C) (Oral)   Ht 5' 4.5 (1.638 m)   Wt 174 lb (78.9 kg)   SpO2 97%   BMI 29.41 kg/m  Objective:   Physical Exam Vitals and nursing note reviewed.  Constitutional:      Appearance: Normal appearance.  HENT:     Head: Normocephalic and atraumatic.  Eyes:     Extraocular Movements:  Extraocular movements intact.     Conjunctiva/sclera: Conjunctivae normal.  Skin:    General: Skin is warm.  Neurological:     Mental Status: He is alert.  Psychiatric:        Mood and Affect: Mood normal.  Behavior: Behavior normal.           Assessment & Plan:   1. Type 2 diabetes, controlled, with peripheral neuropathy (HCC) (Primary) 2. Hyperlipidemia, unspecified hyperlipidemia type Most recent A1c 7.7, although increased from prior which was 7, he is still within age appropriate goal which is to be below 8.  Reassured patient that fasting sugar of 140 is slightly elevated but would not warrant medication intervention, counseled him to only check sugars when he suspects they are low as the glimepiride could cause this.  Will not change medication regimen at this time, however, if subsequent A1c is increasing, we will reconsider.  Urine microalbumin ordered as he is due.  HLD has gone untreated, will start Lipitor as below. Declines tetanus booster, UTD on all other vaccines. See all relevant HCM as pertains to diabetes documented in HPI portion.  - Microalbumin/Creatinine Ratio, Urine - atorvastatin (LIPITOR) 20 MG tablet; Take 1 tablet (20 mg total) by mouth at bedtime.  Dispense: 90 tablet; Refill: 1  3. Benzodiazepine dependence (HCC) Patient has been on Xanax  for years we have been attempting to taper down the dose with plans to eventually discontinue for the patient safety.  He did recently experience withdrawals due to erroneously reducing to 0.25 mg daily.  Counseled patient that the 0.25 mg tablets should be taken alongside half of the 1 mg pills to make total of 0.75mg  and this still should be taken twice daily.  Plan for him to continue at this dose for several weeks, at his upcoming visit will taper down further if tolerated.  - Continue Xanax  0.75 mg twice daily  4. Nocturia Patient having this nightly, hyperglycemia likely contributing and driving the  polydipsia which then causes frequent nocturia. Given no signs of urinary obstruction, will refrain from Flomax at this time, counseled patient to try to minimize water intake at bedtime, in the meantime, we will keep a close eye on A1c.  Return in about 4 weeks (around 09/02/2024) for Medication review then in 3 mo for diabetic visit.  Jaidy Cottam K Elianna Windom, MD  08/05/24

## 2024-08-06 ENCOUNTER — Ambulatory Visit: Payer: Self-pay

## 2024-08-11 ENCOUNTER — Other Ambulatory Visit: Payer: Self-pay | Admitting: Family

## 2024-08-16 ENCOUNTER — Other Ambulatory Visit: Payer: Self-pay

## 2024-08-16 DIAGNOSIS — F1320 Sedative, hypnotic or anxiolytic dependence, uncomplicated: Secondary | ICD-10-CM

## 2024-08-16 DIAGNOSIS — G629 Polyneuropathy, unspecified: Secondary | ICD-10-CM

## 2024-08-31 ENCOUNTER — Other Ambulatory Visit: Payer: Self-pay

## 2024-08-31 DIAGNOSIS — F1320 Sedative, hypnotic or anxiolytic dependence, uncomplicated: Secondary | ICD-10-CM

## 2024-08-31 NOTE — Telephone Encounter (Signed)
 Pharmacy sent a request for 0.25 mg but that was just done about 1 week ago. Called pt to verify. He probably needed 1 mg. He does need the 1 mg.

## 2024-09-01 ENCOUNTER — Telehealth: Payer: Self-pay

## 2024-09-01 NOTE — Telephone Encounter (Signed)
 Looks like the 1 mg that was sent in says 1 bid. He is doing 1/2 bid to make 0.75 mg.

## 2024-09-01 NOTE — Telephone Encounter (Signed)
 I will have to move this message to a different message so it can be corrected. Cannot do that in a triage note.

## 2024-09-01 NOTE — Telephone Encounter (Signed)
 Copied from CRM [id number]. Topic: Clinical - Prescription Issue >> Sep 01, 2024 12:04 PM Macario HERO wrote: Reason for CRM: Travis from [facility] - [phone]:  [place] and is requesting a call back with clarity regarding patient medication: ALPRAZolam  (XANAX ) 1 MG tablet [[id number]]. She stated patient was taking a lower dosage and the significant change needs verbally clarity.

## 2024-09-01 NOTE — Telephone Encounter (Signed)
 Please call patient and make sure he is taking his Xanax  correctly has follows: - Cut the 1mg  pill in half, take the half pill together with the 0.25mg  pill to make a total of 0.75mg .  You should be taking 0.75 mg twice a day.

## 2024-09-01 NOTE — Telephone Encounter (Signed)
 Yes, when I spoke to him, he said he is cutting them in half and taking it twice a day with the 0.25 mg.

## 2024-09-08 ENCOUNTER — Ambulatory Visit (INDEPENDENT_AMBULATORY_CARE_PROVIDER_SITE_OTHER)

## 2024-09-08 VITALS — BP 122/66 | HR 81 | Temp 97.7°F | Ht 64.5 in | Wt 182.0 lb

## 2024-09-08 DIAGNOSIS — F1320 Sedative, hypnotic or anxiolytic dependence, uncomplicated: Secondary | ICD-10-CM

## 2024-09-08 NOTE — Progress Notes (Signed)
 Subjective:   This visit was conducted in person. The patient gave informed consent to the use of Abridge AI technology to record the contents of the encounter as documented below.   Patient ID: Patrick [name], male    DOB: 11/22/1935, 88 y.o.   MRN: [id number]   Discussed the use of AI scribe software for clinical note transcription with the patient, who gave verbal consent to proceed.  History of Present Illness Patrick [name] is an 88 year old male who presents with withdrawal symptoms from alprazolam  use.  He experiences withdrawal symptoms, specifically shaking, in the evenings despite taking his prescribed alprazolam . He is currently taking 0.75 mg in the morning and 0.75 mg in the evening, which consists of one 0.25 mg tablet and half of a 1 mg tablet. On some days, he requires an additional 0.5 mg in the evening, totaling 1.25 mg, to alleviate the shaking. This occurs approximately twice a week.  He feels better in the mornings but notes that his body feels 'worn out' by the evening. 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  He is a retired personnel officer and does not consume alcohol or smoke. He prefers white bread, which he consumes regularly. He has a history of diabetes with an A1c level that has been running high for years, around 7.7. He does not consume sugary foods or drinks and has no sugar in the house.  No morning shakes, but he reports evening shaking.    Review of Systems  All other systems reviewed and are negative.       No Known Allergies  Current Outpatient Medications on File Prior to Visit  Medication Sig Dispense Refill   ALPRAZolam  (XANAX ) 0.25 MG tablet Take 0.25 mg by mouth 2 (two) times daily.     ALPRAZolam  (XANAX ) 1 MG tablet Take 1 tablet (1 mg total) by mouth 2 (two) times daily as needed for anxiety.  (Patient taking differently: Take 0.5 mg by mouth 2 (two) times daily.) 60 tablet 0   Ascorbic Acid (VITAMIN C) 1000 MG tablet Take 1,000 mg by mouth daily.     atorvastatin (LIPITOR) 20 MG tablet Take 1 tablet (20 mg total) by mouth at bedtime. 90 tablet 1   betamethasone  dipropionate (DIPROLENE ) 0.05 % ointment Apply to aa's psoriasis QD-BID PRN. Avoid the face, groin, and axilla. 50 g 2   Blood Glucose Monitoring Suppl (ONE TOUCH ULTRA 2) w/Device KIT SMARTSIG:1 Each Via Meter As Directed     cholecalciferol (VITAMIN D) 1000 units tablet Take 1,000 Units by mouth daily.     clobetasol  (TEMOVATE ) 0.05 % external solution Apply 1 Application topically 2 (two) times daily. Apply to scalp until scaling resolves, then stop 50 mL 11   empagliflozin  (JARDIANCE ) 10 MG TABS tablet Take 1 tablet (10 mg total) by mouth daily before breakfast. 30 tablet 11   fish oil-omega-3 fatty acids 1000 MG capsule Take 2 g by mouth daily.     gabapentin  (NEURONTIN ) 300 MG capsule TAKE ONE CAPSULE (300 MG TOTAL) BY MOUTH THREE (THREE) TIMES DAILY. 90 capsule 0   glimepiride (AMARYL) 4 MG tablet TAKE ONE TABLET BY MOUTH TWICE A DAY FOR DIABETES 200 tablet 3   hydrochlorothiazide  (HYDRODIURIL ) 25 MG tablet TAKE ONE TABLET BY MOUTH ONCE DAILY 90 tablet 3   ketoconazole  (NIZORAL ) 2 %  cream Apply twice daily as needed 60 g 1   ketoconazole  (NIZORAL ) 2 % shampoo apply three times per week, massage into scalp and leave in for 10 minutes before rinsing out 120 mL 11   latanoprost (XALATAN) 0.005 % ophthalmic solution Place 1 drop into both eyes Daily.     lisinopril (ZESTRIL) 40 MG tablet TAKE ONE TABLET BY MOUTH EVERY MORNING FOR BLOOD PRESSURE 90 tablet 3   metFORMIN  (GLUCOPHAGE ) 1000 MG tablet TAKE ONE TABLET BY MOUTH BEFORE BREAKFAST AND THEN ONE TABLET BEFORE DINNER AND ONE HALF (1/2) TABLET AT BEDTIME FOR DIABETES. 260 tablet 3   mupirocin  ointment (BACTROBAN ) 2 % APPLY 1 APPLICATION TOPICALLY 3 TIMES DAILY. APPLY TO OPEN  AREAS BUTTOCKS CREASE AS DIRECTED 22 g 2   ONETOUCH DELICA LANCETS 33G MISC 1 Units by Does not apply route as needed. Use one to obtain blood sugar. Dx  E11.49 100 each 3   ONETOUCH ULTRA TEST test strip USE TO CHECK BLOOD SUGAR TWICE DAILY DX E11.49 100 each 3   verapamil  (CALAN -SR) 240 MG CR tablet Take 1 tablet (240 mg total) by mouth daily. 90 tablet 3   vitamin E 400 UNIT capsule Take 400 Units by mouth daily.     No current facility-administered medications on file prior to visit.    BP 122/66 (BP Location: Right Arm, Patient Position: Sitting, Cuff Size: Large)   Pulse 81   Temp 97.7 F (36.5 C) (Oral)   Ht 5' 4.5 (1.638 m)   Wt 182 lb (82.6 kg)   SpO2 96%   BMI 30.76 kg/m   Objective:      Physical Exam GENERAL: Alert, cooperative, well developed, no acute distress.  HEAD: Normocephalic atraumatic. EYES: Extraocular movements intact BL, pupils round, equal and reactive to light BL, conjunctivae normal BL. EXTREMITIES: No cyanosis or edema. NEUROLOGICAL: Oriented to person, place and time, no gait abnormalities, moves all extremities without gross motor or sensory deficit.         Assessment & Plan:   Assessment & Plan Benzodiazepine dependence Chronic dependence for several years, we have weaned to 0.75 mg twice daily, down from 1 mg twice daily.  Reported withdrawal symptoms twice weekly, on those days he ends up taking 0.75 mg in the morning, then 1.25 mg in the evening (0.25 mg tablet with whole 1 mg tablet).  Extensively discussed risks of benzodiazepines again today including bradycardia, respiratory depression, dizziness, drowsiness and falls.  Will continue at current regimen, if in a few weeks, withdrawal symptoms have died down, would consider tapering further down.  However, if withdrawal symptoms remain a significant issue, would change to 0.75 mg in the morning and 1 mg at night, which would still be an improvement from his initial regimen.  - Continue  0.75 mg morning and night. - Adjust evening dose to 1 mg if symptoms persist after four weeks. - Follow-up in five weeks to reassess regimen.   Return in about 5 weeks (around 10/13/2024) for Diabetes and Alprazolam  dose .   Jasdeep Dejarnett K Bayley Yarborough, MD  09/08/24     Contains text generated by Abridge.

## 2024-09-08 NOTE — Patient Instructions (Signed)
 Thank you for visiting [facility] today! Here's what we talked about: - Continue Xanax  0.75mg  twice daily. Cut the 1mg  pill in half, take the half pill together with the 0.25mg  pill to make a total of 0.75mg . Do this twice daily. If you are shaking, take the remaining half of the 1mg  tablet.

## 2024-09-16 DIAGNOSIS — H401131 Primary open-angle glaucoma, bilateral, mild stage: Secondary | ICD-10-CM | POA: Diagnosis not present

## 2024-09-28 ENCOUNTER — Other Ambulatory Visit: Payer: Self-pay

## 2024-09-28 DIAGNOSIS — F1320 Sedative, hypnotic or anxiolytic dependence, uncomplicated: Secondary | ICD-10-CM

## 2024-09-29 DIAGNOSIS — H04123 Dry eye syndrome of bilateral lacrimal glands: Secondary | ICD-10-CM | POA: Diagnosis not present

## 2024-09-29 DIAGNOSIS — H401131 Primary open-angle glaucoma, bilateral, mild stage: Secondary | ICD-10-CM | POA: Diagnosis not present

## 2024-09-29 DIAGNOSIS — E119 Type 2 diabetes mellitus without complications: Secondary | ICD-10-CM | POA: Diagnosis not present

## 2024-09-29 DIAGNOSIS — H2513 Age-related nuclear cataract, bilateral: Secondary | ICD-10-CM | POA: Diagnosis not present

## 2024-09-29 LAB — OPHTHALMOLOGY REPORT-SCANNED

## 2024-10-01 ENCOUNTER — Ambulatory Visit: Payer: Self-pay

## 2024-10-05 ENCOUNTER — Other Ambulatory Visit: Payer: Self-pay

## 2024-10-05 NOTE — Telephone Encounter (Signed)
 Last filled 09-08-24

## 2024-10-12 ENCOUNTER — Other Ambulatory Visit: Payer: Self-pay

## 2024-10-14 ENCOUNTER — Ambulatory Visit: Payer: Self-pay

## 2024-10-14 ENCOUNTER — Ambulatory Visit

## 2024-10-14 VITALS — BP 122/72 | HR 65 | Ht 64.5 in | Wt 178.0 lb

## 2024-10-14 DIAGNOSIS — Z23 Encounter for immunization: Secondary | ICD-10-CM

## 2024-10-14 DIAGNOSIS — E1142 Type 2 diabetes mellitus with diabetic polyneuropathy: Secondary | ICD-10-CM | POA: Diagnosis not present

## 2024-10-14 DIAGNOSIS — Z7984 Long term (current) use of oral hypoglycemic drugs: Secondary | ICD-10-CM | POA: Diagnosis not present

## 2024-10-14 DIAGNOSIS — E785 Hyperlipidemia, unspecified: Secondary | ICD-10-CM | POA: Diagnosis not present

## 2024-10-14 DIAGNOSIS — F1320 Sedative, hypnotic or anxiolytic dependence, uncomplicated: Secondary | ICD-10-CM | POA: Diagnosis not present

## 2024-10-14 LAB — POCT GLYCOSYLATED HEMOGLOBIN (HGB A1C): Hemoglobin A1C: 7.8 % — AB (ref 4.0–5.6)

## 2024-10-14 MED ORDER — METFORMIN HCL 1000 MG PO TABS: 1000.0000 mg | ORAL_TABLET | Freq: Two times a day (BID) | ORAL | Status: AC

## 2024-10-14 MED ORDER — EMPAGLIFLOZIN 10 MG PO TABS: 20.0000 mg | ORAL_TABLET | Freq: Every day | ORAL | Status: DC

## 2024-10-14 NOTE — Patient Instructions (Addendum)
 Thank you for visiting [facility] today! Here's what we talked about: - START taking 2 tablets of Jardiance  - STOP Hydrochlorothiazide  but continue Lisinopril - Go to [employer] and get a smaller size compression socks. Can also get them from [facility] or [facility].

## 2024-10-14 NOTE — Addendum Note (Signed)
 Addended by: KALLIE CLOTILDA SQUIBB on: 10/14/2024 10:31 AM   Modules accepted: Orders

## 2024-10-14 NOTE — Progress Notes (Signed)
 Subjective:   This visit was conducted in person. The patient gave informed consent to the use of [employer] AI technology to record the contents of the encounter as documented below.   Patient ID: Patrick [name], male    DOB: 1935/12/30, 88 y.o.   MRN: [id number]   Patrick [name] is a very pleasant 88 y.o. male who presents today for DM visit.  Discussed the use of AI scribe software for clinical note transcription with the patient, who gave verbal consent to proceed.  History of Present Illness   Taking all meds, low sugars only 1-2 times a week.  -Denies polydipsia, polyuria, weight loss, fatigue or any LE wounds.  Diet: Improving Weight concerns: BMI 30   Review of Systems  All other systems reviewed and are negative.        Past Medical History:  Diagnosis Date   Anxiety    BCC (basal cell carcinoma) 11/27/2021   left posterior neck, EDC 12/18/2021   Diabetes mellitus    GERD (gastroesophageal reflux disease)    Hyperlipidemia    Hypertension    Irregular heartbeat    Dr. (his) stated that it was nothing to worry about.   Peptic ulcer disease    Renal cyst    Rosacea    Squamous cell carcinoma in situ (SCCIS) 06/26/2022   left elbow sccis scheduled for [facility] 08/14/2022    Social History   Socioeconomic History   Marital status: Widowed    Spouse name: Not on file   Number of children: 2   Years of education: Not on file   Highest education level: Not on file  Occupational History   Occupation: RETIRED, Architect: RETIRED  Tobacco Use   Smoking status: Never    Passive exposure: Never   Smokeless tobacco: Never  Substance and Sexual Activity   Alcohol use: No   Drug use: Not on file   Sexual activity: Not on file  Other Topics Concern   Not on file  Social History Narrative   Now has living will   Son is health care POA--no alternate   Would accept resuscitation attempts   No tube feeds or life support if cognitively unaware    Social Drivers of Health   Tobacco Use: Low Risk (09/08/2024)   Patient History    Smoking Tobacco Use: Never    Smokeless Tobacco Use: Never    Passive Exposure: Never  Financial Resource Strain: Not on file  Food Insecurity: Not on file  Transportation Needs: Not on file  Physical Activity: Not on file  Stress: Not on file  Social Connections: Not on file  Intimate Partner Violence: Not on file  Depression (PHQ2-9): Medium Risk (09/08/2024)   Depression (PHQ2-9)    PHQ-2 Score: 5  Alcohol Screen: Not on file  Housing: Not on file  Utilities: Not on file  Health Literacy: Not on file    Past Surgical History:  Procedure Laterality Date   CHOLECYSTECTOMY     KNEE ARTHROSCOPY     left   PARTIAL GASTRECTOMY  1974    Family History  Problem Relation Age of Onset   Cancer Sister    Cancer Brother        prostate   Colon cancer Brother    Kidney failure Brother    Heart disease Brother    Cancer Brother        prostate cancer   Cancer Brother  prostate    Allergies[1]  Medications Ordered Prior to Encounter[2]  There were no vitals taken for this visit.  Objective:     Physical Exam      Physical Exam Vitals and nursing note reviewed.  Constitutional:      Appearance: Normal appearance.  Cardiovascular:     Pulses:          Dorsalis pedis pulses are 2+ on the right side and 2+ on the left side.       Posterior tibial pulses are 2+ on the right side and 2+ on the left side.  Feet:     Right foot:     Protective Sensation: 10 sites tested.  10 sites sensed.     Skin integrity: Skin integrity normal.     Toenail Condition: Right toenails are normal.     Left foot:     Protective Sensation: 10 sites tested.  7 sites sensed.     Skin integrity: Skin integrity normal.     Toenail Condition: Left toenails are normal.  Neurological:     Mental Status: He is alert.         Assessment & Plan:   Assessment & Plan 1. Type 2 diabetes,  controlled, with peripheral neuropathy (HCC) (Primary) A1c unchaged from 7.7-7.8 today, increase Jardiance  below.  Continue all other diabetic medications as below.  Prevnar given today.  Discontinued hydrochlorothiazide  to avoid orthostatic hypotension given increase in Jardiance  dose.  - HgB A1c - metFORMIN  (GLUCOPHAGE ) 1000 MG tablet; Take 1 tablet (1,000 mg total) by mouth 2 (two) times daily with a meal. - empagliflozin  (JARDIANCE ) 10 MG TABS tablet; Take 2 tablets (20 mg total) by mouth daily before breakfast. - Continue glimepiride 4 mg twice daily - Discontinued hydrochlorothiazide , continue lisinopril 40 mg daily  Current A1c: 7.8 Last A1c: 7.7 Med regimen: Jardiance , Metformin ,Glimepiride Urine microalbumin: UTD Eye exam: UTD Foot exam: UTD Ace-i/ARB: Yes Statin: Yes Influenza and Pneumococcal vaccines: UTD flu, gave prevnar today  Neuropathy: No Nephropathy: Yes Retinopathy: No   2. Hyperlipidemia, unspecified hyperlipidemia type Well-controlled on statin, continue regimen as below.  - Continue atorvastatin  20 mg daily  3. Benzodiazepine dependence (HCC) Steadily de-escalating dose, withdrawal symptoms well-controlled on current regimen below, will refrain from any changes at this time and reevaluate at his next visit.  - Continue Xanax  1 mg in the morning and 0.75 mg in the evening      - Continue 1mg  am then 0.75mg  pm - Adjust evening dose to 1 mg if symptoms persist after four weeks. Nocturia   No follow-ups on file.  Suleika Donavan K Dannie Woolen, MD  10/14/2024    Contains text generated by [employer].        [1] No Known Allergies [2]  Current Outpatient Medications on File Prior to Visit  Medication Sig Dispense Refill   ALPRAZolam  (XANAX ) 0.25 MG tablet TAKE ONE TABLET (0.25 MG TOTAL) BY MOUTH TWO TIMES DAILY AS NEEDED FOR ANXIETY. 60 tablet 0   ALPRAZolam  (XANAX ) 1 MG tablet Take 0.5 tablets (0.5 mg total) by mouth 2 (two) times daily. Combine with  0.25mg  tablets to make a total of 0.75mg  twice a day 30 tablet 0   Ascorbic Acid (VITAMIN C) 1000 MG tablet Take 1,000 mg by mouth daily.     atorvastatin  (LIPITOR) 20 MG tablet Take 1 tablet (20 mg total) by mouth at bedtime. 90 tablet 1   betamethasone  dipropionate (DIPROLENE ) 0.05 % ointment Apply to aa's psoriasis QD-BID PRN.  Avoid the face, groin, and axilla. 50 g 2   Blood Glucose Monitoring Suppl (ONE TOUCH ULTRA 2) w/Device KIT SMARTSIG:1 Each Via Meter As Directed     cholecalciferol (VITAMIN D) 1000 units tablet Take 1,000 Units by mouth daily.     clobetasol  (TEMOVATE ) 0.05 % external solution Apply 1 Application topically 2 (two) times daily. Apply to scalp until scaling resolves, then stop 50 mL 11   empagliflozin  (JARDIANCE ) 10 MG TABS tablet Take 1 tablet (10 mg total) by mouth daily before breakfast. 30 tablet 11   fish oil-omega-3 fatty acids 1000 MG capsule Take 2 g by mouth daily.     gabapentin  (NEURONTIN ) 300 MG capsule TAKE ONE CAPSULE (300 MG TOTAL) BY MOUTH THREE (THREE) TIMES DAILY. 90 capsule 0   glimepiride (AMARYL) 4 MG tablet TAKE ONE TABLET BY MOUTH TWICE A DAY FOR DIABETES 200 tablet 3   hydrochlorothiazide  (HYDRODIURIL ) 25 MG tablet TAKE ONE TABLET BY MOUTH ONCE DAILY 90 tablet 3   ketoconazole  (NIZORAL ) 2 % cream Apply twice daily as needed 60 g 1   ketoconazole  (NIZORAL ) 2 % shampoo apply three times per week, massage into scalp and leave in for 10 minutes before rinsing out 120 mL 11   latanoprost (XALATAN) 0.005 % ophthalmic solution Place 1 drop into both eyes Daily.     lisinopril (ZESTRIL) 40 MG tablet TAKE ONE TABLET BY MOUTH EVERY MORNING FOR BLOOD PRESSURE 90 tablet 3   metFORMIN  (GLUCOPHAGE ) 1000 MG tablet TAKE ONE TABLET BY MOUTH BEFORE BREAKFAST AND THEN ONE TABLET BEFORE DINNER AND ONE HALF (1/2) TABLET AT BEDTIME FOR DIABETES. 260 tablet 3   mupirocin  ointment (BACTROBAN ) 2 % APPLY 1 APPLICATION TOPICALLY 3 TIMES DAILY. APPLY TO OPEN AREAS BUTTOCKS  CREASE AS DIRECTED 22 g 2   ONETOUCH DELICA LANCETS 33G MISC 1 Units by Does not apply route as needed. Use one to obtain blood sugar. Dx  E11.49 100 each 3   ONETOUCH ULTRA TEST test strip USE TO CHECK BLOOD SUGAR TWICE DAILY  DX E11.49 100 each 3   verapamil  (CALAN -SR) 240 MG CR tablet Take 1 tablet (240 mg total) by mouth daily. 90 tablet 3   vitamin E 400 UNIT capsule Take 400 Units by mouth daily.     No current facility-administered medications on file prior to visit.

## 2024-10-18 ENCOUNTER — Other Ambulatory Visit: Payer: Self-pay

## 2024-10-18 DIAGNOSIS — E1142 Type 2 diabetes mellitus with diabetic polyneuropathy: Secondary | ICD-10-CM

## 2024-10-18 NOTE — Telephone Encounter (Signed)
 Please call patient and inform him that since we increased his jardiance  dose, I have refilled his Jardiance  at the 25 mg pill, so he needs to just take 1 tablet daily

## 2024-10-25 ENCOUNTER — Other Ambulatory Visit: Payer: Self-pay

## 2024-11-01 ENCOUNTER — Other Ambulatory Visit: Payer: Self-pay

## 2024-11-01 DIAGNOSIS — F1320 Sedative, hypnotic or anxiolytic dependence, uncomplicated: Secondary | ICD-10-CM

## 2024-11-15 ENCOUNTER — Other Ambulatory Visit: Payer: Self-pay

## 2024-11-15 NOTE — Telephone Encounter (Signed)
 Last OV 10-14-24 Next OV [phone] [facility]

## 2024-11-16 MED ORDER — LISINOPRIL 40 MG PO TABS: 40.0000 mg | ORAL_TABLET | Freq: Every day | ORAL | 3 refills | Status: AC

## 2024-11-16 MED ORDER — VERAPAMIL HCL ER 240 MG PO TBCR: 240.0000 mg | EXTENDED_RELEASE_TABLET | Freq: Every day | ORAL | 3 refills | Status: AC

## 2024-11-19 ENCOUNTER — Other Ambulatory Visit: Payer: Self-pay

## 2024-11-19 DIAGNOSIS — G629 Polyneuropathy, unspecified: Secondary | ICD-10-CM

## 2024-11-29 ENCOUNTER — Other Ambulatory Visit: Payer: Self-pay

## 2024-11-29 DIAGNOSIS — F1320 Sedative, hypnotic or anxiolytic dependence, uncomplicated: Secondary | ICD-10-CM

## 2024-12-03 ENCOUNTER — Telehealth: Payer: Self-pay

## 2024-12-03 MED ORDER — ACCU-CHEK GUIDE TEST VI STRP: ORAL_STRIP | 4 refills | Status: AC

## 2024-12-03 MED ORDER — ACCU-CHEK SOFTCLIX LANCET DEV KIT: 1.0000 | PACK | Freq: Once | 0 refills | Status: AC

## 2024-12-03 MED ORDER — ACCU-CHEK SOFTCLIX LANCETS MISC: 4 refills | Status: AC

## 2024-12-03 MED ORDER — ACCU-CHEK GUIDE W/DEVICE KIT: 1.0000 | PACK | Freq: Once | 0 refills | Status: AC

## 2024-12-03 NOTE — Telephone Encounter (Signed)
 Copied from CRM [id number]. Topic: Clinical - Prescription Issue >> Dec 03, 2024  1:49 PM Alfonso ORN wrote: Reason for CRM: [facility] pharmacy stated changes needed for test strips. drug insurance requesting to change from [facility] ULTRA TEST ,ONETOUCH DELICA LANCETS 33G MISC to   accuchek guide , machine , test strips ( will need 3 different prescriptions with twice daily dosing)   callback: [phone]

## 2025-01-12 ENCOUNTER — Encounter

## 2025-02-14 ENCOUNTER — Ambulatory Visit
